# Patient Record
Sex: Female | Born: 1970 | Race: Black or African American | Hispanic: No | Smoking: Former smoker
Health system: Southern US, Community
[De-identification: ages and names within clinical notes are randomized; demographics above are authoritative.]

## PROBLEM LIST (undated history)

## (undated) DIAGNOSIS — Z9221 Personal history of antineoplastic chemotherapy: Secondary | ICD-10-CM

## (undated) DIAGNOSIS — Z923 Personal history of irradiation: Secondary | ICD-10-CM

## (undated) DIAGNOSIS — Z8042 Family history of malignant neoplasm of prostate: Secondary | ICD-10-CM

## (undated) DIAGNOSIS — Z803 Family history of malignant neoplasm of breast: Secondary | ICD-10-CM

## (undated) DIAGNOSIS — C50919 Malignant neoplasm of unspecified site of unspecified female breast: Secondary | ICD-10-CM

## (undated) DIAGNOSIS — Z800 Family history of malignant neoplasm of digestive organs: Secondary | ICD-10-CM

## (undated) DIAGNOSIS — K219 Gastro-esophageal reflux disease without esophagitis: Secondary | ICD-10-CM

## (undated) DIAGNOSIS — Z1501 Genetic susceptibility to malignant neoplasm of breast: Secondary | ICD-10-CM

## (undated) DIAGNOSIS — J45909 Unspecified asthma, uncomplicated: Secondary | ICD-10-CM

## (undated) HISTORY — DX: Family history of malignant neoplasm of digestive organs: Z80.0

## (undated) HISTORY — PX: COLONOSCOPY: SHX174

## (undated) HISTORY — PX: PORT-A-CATH REMOVAL: SHX5289

## (undated) HISTORY — DX: Family history of malignant neoplasm of prostate: Z80.42

## (undated) HISTORY — DX: Genetic susceptibility to malignant neoplasm of breast: Z15.01

## (undated) HISTORY — PX: MASTECTOMY: SHX3

## (undated) HISTORY — DX: Personal history of irradiation: Z92.3

## (undated) HISTORY — DX: Family history of malignant neoplasm of breast: Z80.3

## (undated) NOTE — *Deleted (*Deleted)
Radiation Oncology         [phone] ________________________________  Name: TATUM CORL MRN: [id number]  Date: 05/19/2020  DOB: 1971-05-25  Follow-Up Visit Note  CC: Patient, [name]  Serena Croissant, MD  No diagnosis found.  Diagnosis: Stage IIIC (ypT0, ypN0) Right Breast Invasive Ductal Carcinoma, ER+ / PR- / Her2-, Grade 3  Interval Since Last Radiation: Two weeks  Radiation Treatment Dates: 03/25/2020 through 05/05/2020 Site Technique Total Dose (Gy) Dose per Fx (Gy) Completed Fx Beam Energies  Chest Wall, Right: CW_Rt 3D 50/50 2 25/25 6X  Chest Wall, Right: CW_Rt_Bst Electron 10/10 2 5/5 6E  Chest Wall, Right: CW_Rt_SCV 3D 50/50 2 25/25 6X, 10X    Narrative:  The patient returns today for skin recheck. She was seen four days ago for an unscheduled follow-up regarding skin breakdown in the treatment area. At that time, she was given Silvadene and was advised to continue using Sonofine elsewhere along the chest wall area. She was also given samples of triple antibiotic ointment to use.   On review of systems, she reports ***. She denies ***.   ALLERGIES:  is allergic to penicillins.  Meds: Current Outpatient Medications  Medication Sig Dispense Refill  . acetaminophen (TYLENOL) 325 MG tablet Take 650 mg by mouth every 6 (six) hours as needed for moderate pain or headache.     . cetirizine (ZYRTEC) 10 MG tablet Take 10 mg by mouth daily as needed for allergies.    Marland Kitchen gabapentin (NEURONTIN) 100 MG capsule Take 100-300 mg by mouth See admin instructions. Take 100 mg at bedtime, may increase up to 300 mg at bedtime as needed for pain    . gabapentin (NEURONTIN) 100 MG capsule Take 1 capsule (100 mg total) by mouth at bedtime. 30 capsule 1  . ibuprofen (ADVIL) 800 MG tablet Take 800 mg by mouth every 8 (eight) hours as needed for moderate pain.    Marland Kitchen lidocaine-prilocaine (EMLA) cream Apply to affected area once (Patient taking differently: Apply 1 application topically  daily as needed (port access). ) 30 g 3  . LORazepam (ATIVAN) 0.5 MG tablet Take 1 tablet (0.5 mg total) by mouth at bedtime as needed (Nausea or vomiting). 30 tablet 1  . methocarbamol (ROBAXIN) 500 MG tablet Take 1 tablet (500 mg total) by mouth 3 (three) times daily. 30 tablet 1  . ondansetron (ZOFRAN) 8 MG tablet Take 1 tablet (8 mg total) by mouth 2 (two) times daily as needed. Start on the third day after chemotherapy. 30 tablet 1  . oxyCODONE (OXY IR/ROXICODONE) 5 MG immediate release tablet Take 1 tablet (5 mg total) by mouth every 4 (four) hours as needed for moderate pain. 12 tablet 0  . prochlorperazine (COMPAZINE) 10 MG tablet Take 1 tablet (10 mg total) by mouth every 6 (six) hours as needed (Nausea or vomiting). 30 tablet 1  . tamoxifen (NOLVADEX) 20 MG tablet Take 1 tablet (20 mg total) by mouth daily. 90 tablet 3  . traMADol (ULTRAM) 50 MG tablet Take 1 tablet (50 mg total) by mouth every 6 (six) hours as needed. 12 tablet 0   No current facility-administered medications for this encounter.    Physical Findings: The patient is in no acute distress. Patient is alert and oriented.  vitals were not taken for this visit. The lungs are clear. The heart has a regular rhythm and rate.  The right chest wall area shows ***.  Lab Findings: Lab Results  Component Value Date  WBC 3.4 (L) 01/17/2020   HGB 13.1 01/17/2020   HCT 39.3 01/17/2020   MCV 93.6 01/17/2020   PLT 254 01/17/2020    Radiographic Findings: No results found.  Impression: Stage IIIC (ypT0, ypN0) Right Breast Invasive Ductal Carcinoma, ER+ / PR- / Her2-, Grade 3  The patient is recovering from the effects of radiation. ***  Plan: The patient will return in *** for close follow-up concerning her skin breakdown.  ____________________________________   Billie Lade, PhD, MD  This document serves as a record of services personally performed by Antony Blackbird, MD. It was created on his behalf by Nikki Dom,  a trained medical scribe. The creation of this record is based on the scribe's personal observations and the provider's statements to them. This document has been checked and approved by the attending provider.

---

## 1997-10-09 ENCOUNTER — Inpatient Hospital Stay (HOSPITAL_COMMUNITY): Admission: AD | Admit: 1997-10-09 | Discharge: 1997-10-09 | Payer: Self-pay | Admitting: *Deleted

## 1997-10-09 ENCOUNTER — Other Ambulatory Visit: Admission: RE | Admit: 1997-10-09 | Discharge: 1997-10-09 | Payer: Self-pay | Admitting: *Deleted

## 1999-01-10 ENCOUNTER — Encounter: Payer: Self-pay | Admitting: Emergency Medicine

## 1999-01-10 ENCOUNTER — Emergency Department (HOSPITAL_COMMUNITY): Admission: EM | Admit: 1999-01-10 | Discharge: 1999-01-10 | Payer: Self-pay | Admitting: Emergency Medicine

## 1999-08-20 ENCOUNTER — Emergency Department (HOSPITAL_COMMUNITY): Admission: EM | Admit: 1999-08-20 | Discharge: 1999-08-20 | Payer: Self-pay | Admitting: Emergency Medicine

## 1999-12-29 ENCOUNTER — Emergency Department (HOSPITAL_COMMUNITY): Admission: EM | Admit: 1999-12-29 | Discharge: 1999-12-29 | Payer: Self-pay | Admitting: Emergency Medicine

## 2003-03-05 ENCOUNTER — Emergency Department (HOSPITAL_COMMUNITY): Admission: EM | Admit: 2003-03-05 | Discharge: 2003-03-05 | Payer: Self-pay | Admitting: Emergency Medicine

## 2003-03-05 ENCOUNTER — Encounter: Payer: Self-pay | Admitting: Emergency Medicine

## 2004-02-12 ENCOUNTER — Encounter (INDEPENDENT_AMBULATORY_CARE_PROVIDER_SITE_OTHER): Payer: Self-pay | Admitting: Specialist

## 2004-02-12 ENCOUNTER — Ambulatory Visit (HOSPITAL_COMMUNITY): Admission: RE | Admit: 2004-02-12 | Discharge: 2004-02-12 | Payer: Self-pay | Admitting: Obstetrics

## 2005-04-24 ENCOUNTER — Emergency Department (HOSPITAL_COMMUNITY): Admission: EM | Admit: 2005-04-24 | Discharge: 2005-04-24 | Payer: Self-pay | Admitting: Emergency Medicine

## 2005-12-09 ENCOUNTER — Emergency Department (HOSPITAL_COMMUNITY): Admission: EM | Admit: 2005-12-09 | Discharge: 2005-12-09 | Payer: Self-pay | Admitting: Family Medicine

## 2006-09-09 ENCOUNTER — Emergency Department (HOSPITAL_COMMUNITY): Admission: EM | Admit: 2006-09-09 | Discharge: 2006-09-09 | Payer: Self-pay | Admitting: Family Medicine

## 2007-04-19 ENCOUNTER — Emergency Department (HOSPITAL_COMMUNITY): Admission: EM | Admit: 2007-04-19 | Discharge: 2007-04-19 | Payer: Self-pay | Admitting: Family Medicine

## 2008-03-06 ENCOUNTER — Emergency Department (HOSPITAL_COMMUNITY): Admission: EM | Admit: 2008-03-06 | Discharge: 2008-03-06 | Payer: Self-pay | Admitting: Family Medicine

## 2008-05-15 ENCOUNTER — Inpatient Hospital Stay (HOSPITAL_COMMUNITY): Admission: AD | Admit: 2008-05-15 | Discharge: 2008-05-15 | Payer: Self-pay | Admitting: Obstetrics

## 2008-07-19 ENCOUNTER — Emergency Department (HOSPITAL_COMMUNITY): Admission: EM | Admit: 2008-07-19 | Discharge: 2008-07-20 | Payer: Self-pay | Admitting: Family Medicine

## 2008-11-01 ENCOUNTER — Emergency Department (HOSPITAL_COMMUNITY): Admission: EM | Admit: 2008-11-01 | Discharge: 2008-11-01 | Payer: Self-pay | Admitting: Family Medicine

## 2008-12-11 ENCOUNTER — Encounter (INDEPENDENT_AMBULATORY_CARE_PROVIDER_SITE_OTHER): Payer: Self-pay | Admitting: Obstetrics

## 2008-12-11 ENCOUNTER — Ambulatory Visit (HOSPITAL_COMMUNITY): Admission: RE | Admit: 2008-12-11 | Discharge: 2008-12-11 | Payer: Self-pay | Admitting: Obstetrics

## 2009-01-30 ENCOUNTER — Emergency Department (HOSPITAL_COMMUNITY): Admission: EM | Admit: 2009-01-30 | Discharge: 2009-01-30 | Payer: Self-pay | Admitting: Emergency Medicine

## 2010-04-26 ENCOUNTER — Emergency Department (HOSPITAL_COMMUNITY)
Admission: EM | Admit: 2010-04-26 | Discharge: 2010-04-26 | Payer: Self-pay | Source: Home / Self Care | Admitting: Emergency Medicine

## 2010-07-19 ENCOUNTER — Encounter: Payer: Self-pay | Admitting: Obstetrics

## 2010-08-10 ENCOUNTER — Inpatient Hospital Stay (INDEPENDENT_AMBULATORY_CARE_PROVIDER_SITE_OTHER)
Admission: RE | Admit: 2010-08-10 | Discharge: 2010-08-10 | Disposition: A | Discharge status: Home / Self Care | Payer: Self-pay | Source: Ambulatory Visit | Attending: Emergency Medicine | Admitting: Emergency Medicine

## 2010-08-10 ENCOUNTER — Ambulatory Visit (INDEPENDENT_AMBULATORY_CARE_PROVIDER_SITE_OTHER): Payer: Self-pay

## 2010-08-10 DIAGNOSIS — S60229A Contusion of unspecified hand, initial encounter: Secondary | ICD-10-CM

## 2010-08-20 ENCOUNTER — Emergency Department (HOSPITAL_COMMUNITY)
Admission: EM | Admit: 2010-08-20 | Discharge: 2010-08-20 | Disposition: A | Discharge status: Home / Self Care | Payer: Medicaid Other | Attending: Emergency Medicine | Admitting: Emergency Medicine

## 2010-08-20 ENCOUNTER — Emergency Department (HOSPITAL_COMMUNITY): Payer: Medicaid Other

## 2010-08-20 DIAGNOSIS — S6990XA Unspecified injury of unspecified wrist, hand and finger(s), initial encounter: Secondary | ICD-10-CM | POA: Insufficient documentation

## 2010-08-20 DIAGNOSIS — M79609 Pain in unspecified limb: Secondary | ICD-10-CM | POA: Insufficient documentation

## 2010-08-20 DIAGNOSIS — J45909 Unspecified asthma, uncomplicated: Secondary | ICD-10-CM | POA: Insufficient documentation

## 2010-08-20 DIAGNOSIS — S6390XA Sprain of unspecified part of unspecified wrist and hand, initial encounter: Secondary | ICD-10-CM | POA: Insufficient documentation

## 2010-08-20 DIAGNOSIS — IMO0002 Reserved for concepts with insufficient information to code with codable children: Secondary | ICD-10-CM | POA: Insufficient documentation

## 2010-10-03 LAB — POCT URINALYSIS DIP (DEVICE)
Glucose, UA: NEGATIVE mg/dL
Hgb urine dipstick: NEGATIVE
Nitrite: NEGATIVE
Protein, ur: NEGATIVE mg/dL
Specific Gravity, Urine: 1.02 (ref 1.005–1.030)
Urobilinogen, UA: 0.2 mg/dL (ref 0.0–1.0)
pH: 6.5 (ref 5.0–8.0)

## 2010-10-05 LAB — CBC
HCT: 38.7 % (ref 36.0–46.0)
Hemoglobin: 13.6 g/dL (ref 12.0–15.0)
MCHC: 35.3 g/dL (ref 30.0–36.0)
MCV: 94.3 fL (ref 78.0–100.0)
Platelets: 219 10*3/uL (ref 150–400)
RDW: 13.2 % (ref 11.5–15.5)

## 2010-10-06 LAB — CULTURE, ROUTINE-ABSCESS

## 2010-10-21 ENCOUNTER — Other Ambulatory Visit (HOSPITAL_COMMUNITY): Payer: Self-pay | Admitting: Obstetrics

## 2010-10-21 DIAGNOSIS — Z1231 Encounter for screening mammogram for malignant neoplasm of breast: Secondary | ICD-10-CM

## 2010-10-28 ENCOUNTER — Ambulatory Visit (HOSPITAL_COMMUNITY)
Admission: RE | Admit: 2010-10-28 | Discharge: 2010-10-28 | Disposition: A | Discharge status: Home / Self Care | Payer: Medicaid Other | Source: Ambulatory Visit | Attending: Obstetrics | Admitting: Obstetrics

## 2010-10-28 DIAGNOSIS — Z1231 Encounter for screening mammogram for malignant neoplasm of breast: Secondary | ICD-10-CM

## 2010-11-06 ENCOUNTER — Emergency Department (HOSPITAL_COMMUNITY): Payer: Medicaid Other

## 2010-11-06 ENCOUNTER — Emergency Department (HOSPITAL_COMMUNITY)
Admission: EM | Admit: 2010-11-06 | Discharge: 2010-11-06 | Disposition: A | Discharge status: Home / Self Care | Payer: Medicaid Other | Attending: Emergency Medicine | Admitting: Emergency Medicine

## 2010-11-06 DIAGNOSIS — S9030XA Contusion of unspecified foot, initial encounter: Secondary | ICD-10-CM | POA: Insufficient documentation

## 2010-11-06 DIAGNOSIS — M7989 Other specified soft tissue disorders: Secondary | ICD-10-CM | POA: Insufficient documentation

## 2010-11-06 DIAGNOSIS — R262 Difficulty in walking, not elsewhere classified: Secondary | ICD-10-CM | POA: Insufficient documentation

## 2010-11-06 DIAGNOSIS — S8990XA Unspecified injury of unspecified lower leg, initial encounter: Secondary | ICD-10-CM | POA: Insufficient documentation

## 2010-11-06 DIAGNOSIS — M79609 Pain in unspecified limb: Secondary | ICD-10-CM | POA: Insufficient documentation

## 2010-11-06 DIAGNOSIS — J45909 Unspecified asthma, uncomplicated: Secondary | ICD-10-CM | POA: Insufficient documentation

## 2010-11-06 DIAGNOSIS — W208XXA Other cause of strike by thrown, projected or falling object, initial encounter: Secondary | ICD-10-CM | POA: Insufficient documentation

## 2010-11-10 NOTE — Op Note (Signed)
Gina Ross, Gina Ross NO.:  [id number]   MEDICAL RECORD NO.:  [id number]          PATIENT TYPE:  AMB   LOCATION:  SDC                           FACILITY:  [facility]   PHYSICIAN:  Kathreen Cosier, M.D.DATE OF BIRTH:  [birth year]   DATE OF PROCEDURE:  12/11/2008  DATE OF DISCHARGE:  12/11/2008                               OPERATIVE REPORT   PREOPERATIVE DIAGNOSES:  High grade lesion at the cervix on an  endocervical curettage and previous conization.   POSTOPERATIVE DIAGNOSES:  High grade lesion at the cervix on an  endocervical curettage and previous conization.   PROCEDURE:  Cold knife cone.   Under general anesthesia, the patient in lithotomy position, perineum  and vagina prepped and draped.  Bladder emptied with straight catheter.  Cervix injected with 9 mL of 1% Xylocaine.  Hemostatic suture of #1  chromic placed at 3 and 9 o'clock on the lateral aspects of the cervix.  The cold knife cone done in the usual manner and hemostasis was achieved  with U sutures placed around the cervix.  The patient tolerated the  procedure well, taken to recovery room in good condition.           ______________________________  Kathreen Cosier, M.D.     BAM/MEDQ  D:  12/11/2008  T:  12/12/2008  Job:  [id number]

## 2010-11-13 NOTE — Op Note (Signed)
Gina Ross, Gina Ross NO.:  [id number]   MEDICAL RECORD NO.:  [id number]                   PATIENT TYPE:  AMB   LOCATION:  SDC                                  FACILITY:  [facility]   PHYSICIAN:  Kathreen Cosier, M.D.           DATE OF BIRTH:  1970/08/25   DATE OF PROCEDURE:  02/12/2004  DATE OF DISCHARGE:                                 OPERATIVE REPORT   PREOPERATIVE DIAGNOSIS:  Severe dysplasia of the cervix.   POSTOPERATIVE DIAGNOSIS:  Severe dysplasia of the cervix.   OPERATION PERFORMED:  Cold knife conization.   SURGEON:  Kathreen Cosier, M.D.   ANESTHESIA:  General.   DESCRIPTION OF PROCEDURE:  Under general anesthesia, patient in lithotomy  position, perineum and vagina prepped and draped, bladder emptied with  straight catheter.  Bimanual exam, the uterus was normal in size. Weighted  speculum placed in the vagina.  Hemostatic suture of #1 chromic was placed  high up on the lateral aspect of the cervix at 3 o'clock and 9 o'clock.  Then a cold knife cone done in the usual manner.  Hemostasis was achieved  with U-sutures around the cervix with #1 chromic.  Hemostasis was  satisfactory.  The cervix was sounded and found to be patent.  The patient  tolerated the procedure well, taken to recovery room in good condition.                                               Kathreen Cosier, M.D.    BAM/MEDQ  D:  02/12/2004  T:  02/12/2004  Job:  [id number]

## 2011-03-30 LAB — URINALYSIS, ROUTINE W REFLEX MICROSCOPIC
Glucose, UA: NEGATIVE
Hgb urine dipstick: NEGATIVE
Protein, ur: NEGATIVE
Specific Gravity, Urine: 1.02

## 2011-03-30 LAB — POCT PREGNANCY, URINE: Preg Test, Ur: NEGATIVE

## 2011-04-07 LAB — WET PREP, GENITAL
Trich, Wet Prep: NONE SEEN
Yeast Wet Prep HPF POC: NONE SEEN

## 2011-04-07 LAB — POCT URINALYSIS DIP (DEVICE)
Bilirubin Urine: NEGATIVE
Ketones, ur: NEGATIVE
pH: 7

## 2011-04-07 LAB — POCT PREGNANCY, URINE: Operator id: 239701

## 2011-04-07 LAB — GC/CHLAMYDIA PROBE AMP, GENITAL: Chlamydia, DNA Probe: NEGATIVE

## 2011-10-22 ENCOUNTER — Encounter (HOSPITAL_COMMUNITY): Payer: Self-pay

## 2011-10-22 ENCOUNTER — Emergency Department (INDEPENDENT_AMBULATORY_CARE_PROVIDER_SITE_OTHER): Payer: Medicaid Other

## 2011-10-22 ENCOUNTER — Emergency Department (INDEPENDENT_AMBULATORY_CARE_PROVIDER_SITE_OTHER)
Admission: EM | Admit: 2011-10-22 | Discharge: 2011-10-22 | Disposition: A | Discharge status: Home / Self Care | Payer: Self-pay | Source: Home / Self Care | Attending: Emergency Medicine | Admitting: Emergency Medicine

## 2011-10-22 DIAGNOSIS — S40029A Contusion of unspecified upper arm, initial encounter: Secondary | ICD-10-CM

## 2011-10-22 DIAGNOSIS — S40021A Contusion of right upper arm, initial encounter: Secondary | ICD-10-CM

## 2011-10-22 MED ORDER — TRAMADOL HCL 50 MG PO TABS: 100.0000 mg | ORAL_TABLET | Freq: Three times a day (TID) | ORAL | Status: AC | PRN

## 2011-10-22 MED ORDER — MELOXICAM 15 MG PO TABS: 15.0000 mg | ORAL_TABLET | Freq: Every day | ORAL | Status: AC

## 2011-10-22 NOTE — Discharge Instructions (Signed)
Bone Bruise  A bone bruise is a small hidden fracture of the bone. It typically occurs with bones located close to the surface of the skin.  SYMPTOMS  The pain lasts longer than a normal bruise.   The bruised area is difficult to use.   There may be discoloration or swelling of the bruised area.   When a bone bruise is found with injury to the anterior cruciate ligament (in the knee) there is often an increased:   Amount of fluid in the knee   Time the fluid in the knee lasts.   Number of days until you are walking normally and regaining the motion you had before the injury.   Number of days with pain from the injury.  DIAGNOSIS  It can only be seen on X-rays known as MRIs. This stands for magnetic resonance imaging. A regular X-ray taken of a bone bruise would appear to be normal. A bone bruise is a common injury in the knee and the heel bone (calcaneus). The problems are similar to those produced by stress fractures, which are bone injuries caused by overuse. A bone bruise may also be a sign of other injuries. For example, bone bruises are commonly found where an anterior cruciate ligament (ACL) in the knee has been pulled away from the bone (ruptured). A ligament is a tough fibrous material that connects bones together to make our joints stable. Bruises of the bone last a lot longer than bruises of the muscle or tissues beneath the skin. Bone bruises can last from days to months and are often more severe and painful than other bruises. TREATMENT Because bone bruises are sudden injuries you cannot often prevent them, other than by being extremely careful. Some things you can do to improve the condition are:  Apply ice to the sore area for 15 to 20 minutes, 3 to 4 times per day while awake for the first 2 days. Put the ice in a plastic bag, and place a towel between the bag of ice and your skin.   Keep your bruised area raised (elevated) when possible to lessen swelling.   For activity:     Use crutches when necessary; do not put weight on the injured leg until you are no longer tender.   You may walk on your affected part as the pain allows, or as instructed.   Start weight bearing gradually on the bruised part.   Continue to use crutches or a cane until you can stand without causing pain, or as instructed.   If a plaster splint was applied, wear the splint until you are seen for a follow-up examination. Rest it on nothing harder than a pillow the first 24 hours. Do not put weight on it. Do not get it wet. You may take it off to take a shower or bath.   If an air splint was applied, more air may be blown into or out of the splint as needed for comfort. You may take it off at night and to take a shower or bath.   Wiggle your toes in the splint several times per day if you are able.   You may have been given an elastic bandage to use with the plaster splint or alone. The splint is too tight if you have numbness, tingling or if your foot becomes cold and blue. Adjust the bandage to make it comfortable.   Only take over-the-counter or prescription medicines for pain, discomfort, or fever as directed by   your caregiver.   Follow all instructions for follow up with your caregiver. This includes any orthopedic referrals, physical therapy, and rehabilitation. Any delay in obtaining necessary care could result in a delay or failure of the bones to heal.  SEEK MEDICAL CARE IF:   You have an increase in bruising, swelling, or pain.   You notice coldness of your toes.   You do not get pain relief with medications.  SEEK IMMEDIATE MEDICAL CARE IF:   Your toes are numb or blue.   You have severe pain not controlled with medications.   If any of the problems that caused you to seek care are becoming worse.  Document Released: 09/04/2003 Document Revised: 06/03/2011 Document Reviewed: 01/17/2008 [facility] Patient Information 2012 [place], [place].Contusion A contusion is a deep  bruise. Contusions are the result of an injury that caused bleeding under the skin. The contusion may turn blue, purple, or yellow. Minor injuries will give you a painless contusion, but more severe contusions may stay painful and swollen for a few weeks.  CAUSES  A contusion is usually caused by a blow, trauma, or direct force to an area of the body. SYMPTOMS   Swelling and redness of the injured area.   Bruising of the injured area.   Tenderness and soreness of the injured area.   Pain.  DIAGNOSIS  The diagnosis can be made by taking a history and physical exam. An X-ray, CT scan, or MRI may be needed to determine if there were any associated injuries, such as fractures. TREATMENT  Specific treatment will depend on what area of the body was injured. In general, the best treatment for a contusion is resting, icing, elevating, and applying cold compresses to the injured area. Over-the-counter medicines may also be recommended for pain control. Ask your caregiver what the best treatment is for your contusion. HOME CARE INSTRUCTIONS   Put ice on the injured area.   Put ice in a plastic bag.   Place a towel between your skin and the bag.   Leave the ice on for 15 to 20 minutes, 3 to 4 times a day.   Only take over-the-counter or prescription medicines for pain, discomfort, or fever as directed by your caregiver. Your caregiver may recommend avoiding anti-inflammatory medicines (aspirin, ibuprofen, and naproxen) for 48 hours because these medicines may increase bruising.   Rest the injured area.   If possible, elevate the injured area to reduce swelling.  SEEK IMMEDIATE MEDICAL CARE IF:   You have increased bruising or swelling.   You have pain that is getting worse.   Your swelling or pain is not relieved with medicines.  MAKE SURE YOU:   Understand these instructions.   Will watch your condition.   Will get help right away if you are not doing well or get worse.  Document  Released: 03/24/2005 Document Revised: 06/03/2011 Document Reviewed: 04/19/2011 [facility] Patient Information 2012 [place], [place].   Do exercises as demonstrated twice daily, followed by ice for 10 to 15 minutes.  If no better in 2 weeks, follow up with orthopedist.

## 2011-10-22 NOTE — ED Notes (Signed)
Went roller skating last week end, fell several times; pain right upper arm, mid aspect; using motrin for pain; pain limitation w ROM shoulder, elbow, good sensation distaly
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
## 2012-09-01 ENCOUNTER — Encounter (HOSPITAL_COMMUNITY): Payer: Self-pay | Admitting: *Deleted

## 2012-09-01 ENCOUNTER — Emergency Department (INDEPENDENT_AMBULATORY_CARE_PROVIDER_SITE_OTHER)
Admission: EM | Admit: 2012-09-01 | Discharge: 2012-09-01 | Disposition: A | Discharge status: Home / Self Care | Payer: Self-pay | Source: Home / Self Care

## 2012-09-01 DIAGNOSIS — T148XXA Other injury of unspecified body region, initial encounter: Secondary | ICD-10-CM
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
## 2012-09-08 NOTE — ED Provider Notes (Signed)
Medical screening examination/treatment/procedure(s) were performed by non-physician practitioner and as supervising physician I was immediately available for consultation/collaboration.  Leslee Home, M.D.  Reuben Likes, MD 09/08/12 [phone]

## 2013-01-01 ENCOUNTER — Encounter (HOSPITAL_COMMUNITY): Payer: Self-pay | Admitting: Emergency Medicine

## 2013-01-01 ENCOUNTER — Emergency Department (INDEPENDENT_AMBULATORY_CARE_PROVIDER_SITE_OTHER)
Admission: EM | Admit: 2013-01-01 | Discharge: 2013-01-01 | Disposition: A | Discharge status: Home / Self Care | Payer: Self-pay | Source: Home / Self Care

## 2013-01-01 DIAGNOSIS — J020 Streptococcal pharyngitis: Secondary | ICD-10-CM

## 2013-01-01 LAB — POCT RAPID STREP A: Streptococcus, Group A Screen (Direct): POSITIVE — AB

## 2013-01-01 MED ORDER — PENICILLIN G BENZATHINE 1200000 UNIT/2ML IM SUSP
INTRAMUSCULAR | Status: AC
  Filled 2013-01-01: qty 2

## 2013-01-01 MED ORDER — METHYLPREDNISOLONE ACETATE 40 MG/ML IJ SUSP
20.0000 mg | Freq: Once | INTRAMUSCULAR | Status: AC
  Administered 2013-01-01: 20 mg via INTRAMUSCULAR

## 2013-01-01 MED ORDER — METHYLPREDNISOLONE ACETATE 40 MG/ML IJ SUSP
INTRAMUSCULAR | Status: AC
  Filled 2013-01-01: qty 5

## 2013-01-01 MED ORDER — TRAMADOL HCL 50 MG PO TABS: 50.0000 mg | ORAL_TABLET | Freq: Four times a day (QID) | ORAL | Status: DC | PRN

## 2013-01-01 MED ORDER — AZITHROMYCIN 250 MG PO TABS: ORAL_TABLET | ORAL | Status: DC

## 2013-01-01 MED ORDER — PENICILLIN G BENZATHINE 1200000 UNIT/2ML IM SUSP
1.2000 10*6.[IU] | Freq: Once | INTRAMUSCULAR | Status: AC
  Administered 2013-01-01: 1.2 10*6.[IU] via INTRAMUSCULAR

## 2013-01-01 NOTE — ED Provider Notes (Signed)
History    CSN: [id number] Arrival date & time 01/01/13  1015  First MD Initiated Contact with Patient 01/01/13 1057     Chief Complaint  Patient presents with  . Sore Throat   (Consider location/radiation/quality/duration/timing/severity/associated sxs/prior Treatment) HPI    42 yo bf presents with the above complaint.  States that sudden onset of sore throat started yesterday and was feeling fine the day before.   Pain with swallowing and talking.  Feels hot with chills.  Has not checked temp.  Denies headache, nause, vomiting, cough, cp, sob, dysphagia.   History reviewed. No pertinent past medical history. History reviewed. No pertinent past surgical history. Family History  Problem Relation Age of Onset  . Hypertension Mother   . Hypertension Father    History  Substance Use Topics  . Smoking status: Current Every Day Smoker -- 0.25 packs/day    Types: Cigarettes  . Smokeless tobacco: Not on file  . Alcohol Use: Yes     Comment: socailly   OB History   Grav Para Term Preterm Abortions TAB SAB Ect Mult Living                 Review of Systems  Constitutional: Positive for chills and fatigue.  HENT: Positive for neck pain. Negative for congestion, rhinorrhea, sneezing and postnasal drip.   Eyes: Negative.   Respiratory: Negative for cough, choking, chest tightness and shortness of breath.   Cardiovascular: Negative.   Gastrointestinal: Negative.   Endocrine: Negative.   Genitourinary: Negative.   Skin: Negative.   Allergic/Immunologic: Negative.   Neurological: Negative.   Psychiatric/Behavioral: Negative.     Allergies  Review of patient's allergies indicates no known allergies.  Home Medications   Current Outpatient Rx  Name  Route  Sig  Dispense  Refill  . azithromycin (ZITHROMAX) 250 MG tablet      5 day dose pack.  Take as directed.   6 tablet   0   . traMADol (ULTRAM) 50 MG tablet   Oral   Take 1 tablet (50 mg total) by mouth every 6 (six)  hours as needed for pain.   20 tablet   0    BP 130/78  Pulse 84  Temp(Src) 98.6 F (37 C) (Oral)  Resp 16  SpO2 100%  LMP 12/12/2012 Physical Exam  Constitutional: She is oriented to person, place, and time. She appears well-developed and well-nourished.  HENT:  Head: Normocephalic and atraumatic.  Nose: Nose normal.  Mouth/Throat: Oropharyngeal exudate (few white patches.  red, edematous) present.  Eyes: EOM are normal. Pupils are equal, round, and reactive to light.  Neck: Normal range of motion.  Cardiovascular: Normal rate and regular rhythm.   Pulmonary/Chest: Effort normal and breath sounds normal.  Musculoskeletal: Normal range of motion.  Lymphadenopathy:    She has cervical adenopathy.  Neurological: She is alert and oriented to person, place, and time.  Skin: Skin is warm and dry.  Psychiatric: She has a normal mood and affect.    ED Course  Procedures (including critical care time) Labs Reviewed  POCT RAPID STREP A ([facility] ONLY) - Abnormal; Notable for the following:    Streptococcus, Group A Screen (Direct) POSITIVE (*)    All other components within normal limits   No results found. 1. Strep throat     MDM  Give IM Bicillin LA  And depomedrol injections today.  Script for zpack.  Will follow up with primary care in 3-5 days if  not better and will return here or go to the ED sooner if needed.   All questions answered and instructions were discussed.    Meds ordered this encounter  Medications  . penicillin g benzathine (BICILLIN LA) 1200000 UNIT/2ML injection 1.2 Million Units    Sig:   . methylPREDNISolone acetate (DEPO-MEDROL) injection 20 mg    Sig:   . azithromycin (ZITHROMAX) 250 MG tablet    Sig: 5 day dose pack.  Take as directed.    Dispense:  6 tablet    Refill:  0  . traMADol (ULTRAM) 50 MG tablet    Sig: Take 1 tablet (50 mg total) by mouth every 6 (six) hours as needed for pain.    Dispense:  20 tablet    Refill:  0    Zonia Kief, PA-C 01/01/13 1125

## 2013-01-01 NOTE — ED Notes (Signed)
Pt c/o sore throat onset yesterday. Feels like lymphnodes are swollen on the right side and is in severe pain especially when swallowing. Has tried gargling with salt water. Denies fever. Yesterday felt really hot and then had chills. Patient is alert and oriented.

## 2013-01-01 NOTE — ED Provider Notes (Signed)
Medical screening examination/treatment/procedure(s) were performed by non-physician practitioner and as supervising physician I was immediately available for consultation/collaboration.  Leslee Home, M.D.  Reuben Likes, MD 01/01/13 [phone]

## 2013-07-30 NOTE — ED Notes (Signed)
Accessed record for jennifer/billing

## 2013-08-13 ENCOUNTER — Encounter (HOSPITAL_COMMUNITY): Payer: Self-pay | Admitting: Emergency Medicine

## 2013-08-13 ENCOUNTER — Emergency Department (INDEPENDENT_AMBULATORY_CARE_PROVIDER_SITE_OTHER): Payer: Self-pay

## 2013-08-13 ENCOUNTER — Emergency Department (INDEPENDENT_AMBULATORY_CARE_PROVIDER_SITE_OTHER)
Admission: EM | Admit: 2013-08-13 | Discharge: 2013-08-13 | Disposition: A | Discharge status: Home / Self Care | Payer: Self-pay | Source: Home / Self Care | Attending: Family Medicine | Admitting: Family Medicine

## 2013-08-13 DIAGNOSIS — S92919A Unspecified fracture of unspecified toe(s), initial encounter for closed fracture: Secondary | ICD-10-CM

## 2013-08-13 DIAGNOSIS — S92502A Displaced unspecified fracture of left lesser toe(s), initial encounter for closed fracture: Secondary | ICD-10-CM

## 2013-08-13 MED ORDER — IBUPROFEN 800 MG PO TABS
ORAL_TABLET | ORAL | Status: AC
  Filled 2013-08-13: qty 1

## 2013-08-13 MED ORDER — IBUPROFEN 800 MG PO TABS
800.0000 mg | ORAL_TABLET | Freq: Once | ORAL | Status: AC
  Administered 2013-08-13: 800 mg via ORAL

## 2013-08-13 MED ORDER — DICLOFENAC SODIUM 50 MG PO TBEC: 50.0000 mg | DELAYED_RELEASE_TABLET | Freq: Two times a day (BID) | ORAL | Status: DC | PRN

## 2013-08-13 NOTE — ED Provider Notes (Signed)
Gina Ross is a 43 y.o. female who presents to Urgent Care today for left foot pain. Patient accidentally hit the lateral side of her left foot against a tree stump last night. She notes significant pain and difficulty with ambulation. She has not tried any medications yet. She denies any weakness or numbness. No radiating pain. No nausea vomiting or diarrhea. She is well otherwise.   History reviewed. No pertinent past medical history. History  Substance Use Topics  . Smoking status: Current Every Day Smoker -- 0.25 packs/day    Types: Cigarettes  . Smokeless tobacco: Not on file  . Alcohol Use: Yes     Comment: socailly   ROS as above Medications: No current facility-administered medications for this encounter.   Current Outpatient Prescriptions  Medication Sig Dispense Refill  . diclofenac (VOLTAREN) 50 MG EC tablet Take 1 tablet (50 mg total) by mouth 2 (two) times daily as needed.  60 tablet  0    Exam:  BP 125/91  Pulse 64  Temp(Src) 98.2 F (36.8 C) (Oral)  Resp 16  SpO2 98%  LMP 08/06/2013 Gen: Well NAD LEFT FOOT: Normal-appearing no deformity noted. Tender palpation along the distal fifth metatarsal and proximal phalanx Capillary refill sensation and motion are intact distally   No results found for this or any previous visit (from the past 24 hour(s)). Dg Foot Complete Left  08/13/2013   CLINICAL DATA:  Pain post trauma  EXAM: LEFT FOOT - COMPLETE 3+ VIEW  COMPARISON:  Nov 06, 2010  FINDINGS: Oblique, and lateral views were obtained. There is a fracture through the mid portion of the fifth proximal phalanx. Alignment is near anatomic. No other fractures. No dislocation. Joint spaces appear intact. There is early bunion formation at first MTP joint.  IMPRESSION: Fracture fifth proximal phalanx in near anatomic alignment. Early bunion formation at first MTP joint region.   Electronically Signed   By: Lowella Grip M.D.   On: 08/13/2013 08:36    Assessment and  Plan: 43 y.o. female with fifth proximal phalanx fracture. Plan: Diclofenac for pain, body tape, postoperative shoe. Followup  Discussed warning signs or symptoms. Please see discharge instructions. Patient expresses understanding.    Gregor Hams, MD 08/13/13 [phone]

## 2013-08-13 NOTE — ED Notes (Signed)
States she injured left 5th toe last PM; pain "10" has not taken anything at home for pain

## 2013-08-13 NOTE — Discharge Instructions (Signed)
Thank you for coming in today. Keep the two toes taped together for 4 weeks.  Use the rigid sole shoe as needed for pain.  Come back as needed.   Buddy Taping of Toes We have taped your toes together to keep them from moving. This is called "buddy taping" since we used a part of your own body to keep the injured part still. We placed soft padding between your toes to keep them from rubbing against each other. Buddy taping will help with healing and to reduce pain. Keep your toes buddy taped together for as long as directed by your caregiver. HOME CARE INSTRUCTIONS   Raise your injured area above the level of your heart while sitting or lying down. Prop it up with pillows.  An ice pack used every twenty minutes, while awake, for the first one to two days may be helpful. Put ice in a plastic bag and put a towel between the bag and your skin.  Watch for signs that the taping is too tight. These signs may be:  Numbness of your taped toes.  Coolness of your taped toes.  Color change in the area beyond the tape.  Increased pain.  If you have any of these signs, loosen or rewrap the tape. If you need to loosen or rewrap the buddy tape, make sure you use the padding again. SEEK IMMEDIATE MEDICAL CARE IF:   You have worse pain, swelling, inflammation (soreness), drainage or bleeding after you rewrap the tape.  Any new problems occur. MAKE SURE YOU:   Understand these instructions.  Will watch your condition.  Will get help right away if you are not doing well or get worse. Document Released: 03/18/2004 Document Revised: 09/06/2011 Document Reviewed: 06/11/2008 [facility] Patient Information 2014 [place].  Toe Fracture Your caregiver has diagnosed you as having a fractured toe. A toe fracture is a break in the bone of a toe. "Buddy taping" is a way of splinting your broken toe, by taping the broken toe to the toe next to it. This "buddy taping" will keep the injured toe from  moving beyond normal range of motion. Buddy taping also helps the toe heal in a more normal alignment. It may take 6 to 8 weeks for the toe injury to heal. Eads your toes taped together for as long as directed by your caregiver or until you see a doctor for a follow-up examination. You can change the tape after bathing. Always use a small piece of gauze or cotton between the toes when taping them together. This will help the skin stay dry and prevent infection.  Apply ice to the injury for 15-20 minutes each hour while awake for the first 2 days. Put the ice in a plastic bag and place a towel between the bag of ice and your skin.  After the first 2 days, apply heat to the injured area. Use heat for the next 2 to 3 days. Place a heating pad on the foot or soak the foot in warm water as directed by your caregiver.  Keep your foot elevated as much as possible to lessen swelling.  Wear sturdy, supportive shoes. The shoes should not pinch the toes or fit tightly against the toes.  Your caregiver may prescribe a rigid shoe if your foot is very swollen.  Your may be given crutches if the pain is too great and it hurts too much to walk.  Only take over-the-counter or prescription medicines  for pain, discomfort, or fever as directed by your caregiver.  If your caregiver has given you a follow-up appointment, it is very important to keep that appointment. Not keeping the appointment could result in a chronic or permanent injury, pain, and disability. If there is any problem keeping the appointment, you must call back to this facility for assistance. SEEK MEDICAL CARE IF:   You have increased pain or swelling, not relieved with medications.  The pain does not get better after 1 week.  Your injured toe is cold when the others are warm. SEEK IMMEDIATE MEDICAL CARE IF:   The toe becomes cold, numb, or white.  The toe becomes hot (inflamed) and red. Document Released:  06/11/2000 Document Revised: 09/06/2011 Document Reviewed: 01/29/2008 [facility] Patient Information 2014 [place].

## 2013-12-27 ENCOUNTER — Other Ambulatory Visit (HOSPITAL_COMMUNITY)
Admission: RE | Admit: 2013-12-27 | Discharge: 2013-12-27 | Disposition: A | Discharge status: Home / Self Care | Payer: Medicaid Other | Source: Ambulatory Visit | Attending: Family Medicine | Admitting: Family Medicine

## 2013-12-27 ENCOUNTER — Encounter (HOSPITAL_COMMUNITY): Payer: Self-pay | Admitting: Emergency Medicine

## 2013-12-27 ENCOUNTER — Emergency Department (INDEPENDENT_AMBULATORY_CARE_PROVIDER_SITE_OTHER)
Admission: EM | Admit: 2013-12-27 | Discharge: 2013-12-27 | Disposition: A | Discharge status: Home / Self Care | Payer: Self-pay | Source: Home / Self Care | Attending: Family Medicine | Admitting: Family Medicine

## 2013-12-27 DIAGNOSIS — N760 Acute vaginitis: Secondary | ICD-10-CM | POA: Insufficient documentation

## 2013-12-27 DIAGNOSIS — Z113 Encounter for screening for infections with a predominantly sexual mode of transmission: Secondary | ICD-10-CM | POA: Insufficient documentation

## 2013-12-27 MED ORDER — METRONIDAZOLE 500 MG PO TABS: 500.0000 mg | ORAL_TABLET | Freq: Two times a day (BID) | ORAL | Status: DC

## 2013-12-27 NOTE — Discharge Instructions (Signed)
Thank you for coming in today. If your belly pain worsens, or you have high fever, bad vomiting, blood in your stool or black tarry stool go to the Emergency Room.    Bacterial Vaginosis Bacterial vaginosis is an infection of the vagina. It happens when too many of certain germs (bacteria) grow in the vagina. HOME CARE  Take your medicine as told by your doctor.  Finish your medicine even if you start to feel better.  Do not have sex until you finish your medicine and are better.  Tell your sex partner that you have an infection. They should see their doctor for treatment.  Practice safe sex. Use condoms. Have only one sex partner. GET HELP IF:  You are not getting better after 3 days of treatment.  You have more grey fluid (discharge) coming from your vagina than before.  You have more pain than before.  You have a fever. MAKE SURE YOU:   Understand these instructions.  Will watch your condition.  Will get help right away if you are not doing well or get worse. Document Released: 03/23/2008 Document Revised: 04/04/2013 Document Reviewed: 01/24/2013 [facility] Patient Information 2015 [place], [place]. This information is not intended to replace advice given to you by your health care provider. Make sure you discuss any questions you have with your health care provider.   Trichomoniasis Trichomoniasis is an infection caused by an organism called Trichomonas. The infection can affect both women and men. In women, the outer female genitalia and the vagina are affected. In men, the penis is mainly affected, but the prostate and other reproductive organs can also be involved. Trichomoniasis is a sexually transmitted infection (STI) and is most often passed to another person through sexual contact.  RISK FACTORS  Having unprotected sexual intercourse.  Having sexual intercourse with an infected partner. SIGNS AND SYMPTOMS  Symptoms of trichomoniasis in women include:  Abnormal  gray-green frothy vaginal discharge.  Itching and irritation of the vagina.  Itching and irritation of the area outside the vagina. Symptoms of trichomoniasis in men include:   Penile discharge with or without pain.  Pain during urination. This results from inflammation of the urethra. DIAGNOSIS  Trichomoniasis may be found during a Pap test or physical exam. Your health care provider may use one of the following methods to help diagnose this infection:  Examining vaginal discharge under a microscope. For men, urethral discharge would be examined.  Testing the pH of the vagina with a test tape.  Using a vaginal swab test that checks for the Trichomonas organism. A test is available that provides results within a few minutes.  Doing a culture test for the organism. This is not usually needed. TREATMENT   You may be given medicine to fight the infection. Women should inform their health care provider if they could be or are pregnant. Some medicines used to treat the infection should not be taken during pregnancy.  Your health care provider may recommend over-the-counter medicines or creams to decrease itching or irritation.  Your sexual partner will need to be treated if infected. HOME CARE INSTRUCTIONS   Take all medicine prescribed by your health care provider.  Take over-the-counter medicine for itching or irritation as directed by your health care provider.  Do not have sexual intercourse while you have the infection.  Women should not douche or wear tampons while they have the infection.  Discuss your infection with your partner. Your partner may have gotten the infection from you, or  you may have gotten it from your partner.  Have your sex partner get examined and treated if necessary.  Practice safe, informed, and protected sex.  See your health care provider for other STI testing. SEEK MEDICAL CARE IF:   You still have symptoms after you finish your  medicine.  You develop abdominal pain.  You have pain when you urinate.  You have bleeding after sexual intercourse.  You develop a rash.  Your medicine makes you sick or makes you throw up (vomit). Document Released: 12/08/2000 Document Revised: 06/19/2013 Document Reviewed: 03/26/2013 [facility] Patient Information 2015 [place], [place]. This information is not intended to replace advice given to you by your health care provider. Make sure you discuss any questions you have with your health care provider.

## 2013-12-27 NOTE — ED Notes (Signed)
C/o vaginal discharge onset Mon.  Partner told her Sat that he had Trich.  Occ. lower abd. Pain.

## 2013-12-27 NOTE — ED Provider Notes (Signed)
Gina Ross is a 43 y.o. female who presents to Urgent Care today for vaginal discharge and odor present for 3 days. Her partner is concerned for Trichomonas. No fevers or chills nausea vomiting or diarrhea. Occasional mild abdominal pain. Patient feels well otherwise. No urinary symptoms.   History reviewed. No pertinent past medical history. History  Substance Use Topics  . Smoking status: Current Every Day Smoker -- 0.10 packs/day    Types: Cigarettes  . Smokeless tobacco: Not on file  . Alcohol Use: Yes     Comment: socailly   ROS as above Medications: No current facility-administered medications for this encounter.   Current Outpatient Prescriptions  Medication Sig Dispense Refill  . metroNIDAZOLE (FLAGYL) 500 MG tablet Take 1 tablet (500 mg total) by mouth 2 (two) times daily.  14 tablet  0    Exam:  BP 140/98  Pulse 89  Temp(Src) 98.5 F (36.9 C) (Oral)  Resp 18  SpO2 100%  LMP 12/18/2013 Gen: Well NAD HEENT: EOMI,  MMM Lungs: Normal work of breathing. CTABL Heart: RRR no MRG Abd: NABS, Soft. NT, ND Exts: Brisk capillary refill, warm and well perfused.  GYN: Normal external genitalia. Vaginal canal with thin white discharge. Normal-appearing cervix. Nontender.  No results found for this or any previous visit (from the past 24 hour(s)). No results found.  Assessment and Plan: 43 y.o. female with vaginitis. Empiric treatment with metronidazole. Cytology pending.  Discussed warning signs or symptoms. Please see discharge instructions. Patient expresses understanding.    Gregor Hams, MD 12/27/13 [phone]

## 2014-01-01 ENCOUNTER — Telehealth (HOSPITAL_COMMUNITY): Payer: Self-pay | Admitting: *Deleted

## 2014-01-01 NOTE — ED Notes (Signed)
GC/Chlamydia neg., Affirm: Candida neg., Gardnerella and Trich pos. Pt. adequately treated with Flagyl.  I called pt. Pt. verified x 2 and given results.  Pt. told she was adequately treated for both with the Flagyl and to finish all of her medication.  Pt. instructed to notify her partner to be treated with Flagyl, no sex until she has finished her medication and her partner has been treated and to practice safe sex. Pt. told she can get HIV testing at the [facility] STD clinic, by appointment.  She said she gets that every year. Roselyn Meier 01/01/2014

## 2014-03-16 ENCOUNTER — Emergency Department (HOSPITAL_COMMUNITY)
Admission: EM | Admit: 2014-03-16 | Discharge: 2014-03-16 | Disposition: A | Discharge status: Home / Self Care | Payer: Medicaid Other | Attending: Emergency Medicine | Admitting: Emergency Medicine

## 2014-03-16 ENCOUNTER — Encounter (HOSPITAL_COMMUNITY): Payer: Self-pay | Admitting: Emergency Medicine

## 2014-03-16 ENCOUNTER — Emergency Department (HOSPITAL_COMMUNITY): Payer: Medicaid Other

## 2014-03-16 DIAGNOSIS — S8990XA Unspecified injury of unspecified lower leg, initial encounter: Secondary | ICD-10-CM | POA: Insufficient documentation

## 2014-03-16 DIAGNOSIS — S82009A Unspecified fracture of unspecified patella, initial encounter for closed fracture: Secondary | ICD-10-CM | POA: Insufficient documentation

## 2014-03-16 DIAGNOSIS — Y9389 Activity, other specified: Secondary | ICD-10-CM | POA: Insufficient documentation

## 2014-03-16 DIAGNOSIS — J45909 Unspecified asthma, uncomplicated: Secondary | ICD-10-CM | POA: Insufficient documentation

## 2014-03-16 DIAGNOSIS — W010XXA Fall on same level from slipping, tripping and stumbling without subsequent striking against object, initial encounter: Secondary | ICD-10-CM | POA: Insufficient documentation

## 2014-03-16 DIAGNOSIS — Z792 Long term (current) use of antibiotics: Secondary | ICD-10-CM | POA: Insufficient documentation

## 2014-03-16 DIAGNOSIS — S99929A Unspecified injury of unspecified foot, initial encounter: Secondary | ICD-10-CM

## 2014-03-16 DIAGNOSIS — Y9289 Other specified places as the place of occurrence of the external cause: Secondary | ICD-10-CM | POA: Insufficient documentation

## 2014-03-16 DIAGNOSIS — S82001A Unspecified fracture of right patella, initial encounter for closed fracture: Secondary | ICD-10-CM

## 2014-03-16 DIAGNOSIS — F17200 Nicotine dependence, unspecified, uncomplicated: Secondary | ICD-10-CM | POA: Insufficient documentation

## 2014-03-16 DIAGNOSIS — X500XXA Overexertion from strenuous movement or load, initial encounter: Secondary | ICD-10-CM | POA: Insufficient documentation

## 2014-03-16 DIAGNOSIS — S99919A Unspecified injury of unspecified ankle, initial encounter: Secondary | ICD-10-CM

## 2014-03-16 HISTORY — DX: Unspecified asthma, uncomplicated: J45.909

## 2014-03-16 MED ORDER — OXYCODONE-ACETAMINOPHEN 5-325 MG PO TABS: 1.0000 | ORAL_TABLET | ORAL | Status: DC | PRN

## 2014-03-16 MED ORDER — HYDROMORPHONE HCL 1 MG/ML IJ SOLN
1.0000 mg | Freq: Once | INTRAMUSCULAR | Status: AC
  Administered 2014-03-16: 1 mg via INTRAVENOUS
  Filled 2014-03-16: qty 1

## 2014-03-16 MED ORDER — IBUPROFEN 800 MG PO TABS
800.0000 mg | ORAL_TABLET | Freq: Three times a day (TID) | ORAL | Status: DC
Start: 2014-03-16 — End: 2015-05-11

## 2014-03-16 MED ORDER — HYDROMORPHONE HCL 1 MG/ML IJ SOLN
0.5000 mg | Freq: Once | INTRAMUSCULAR | Status: AC
  Administered 2014-03-16: 0.5 mg via INTRAMUSCULAR
  Filled 2014-03-16: qty 1

## 2014-03-16 NOTE — Progress Notes (Signed)
Orthopedic Tech Progress Note Patient Details:  Gina Ross 05/09/1971 [id number]  Ortho Devices Type of Ortho Device: Knee Immobilizer;Crutches Ortho Device/Splint Interventions: Application   [name] 03/16/2014, 9:37 AM

## 2014-03-16 NOTE — ED Provider Notes (Signed)
CSN: [id number]     Arrival date & time 03/16/14  8099 History   First MD Initiated Contact with Patient 03/16/14 [phone]     Chief Complaint  Patient presents with  . Knee Injury     (Consider location/radiation/quality/duration/timing/severity/associated sxs/prior Treatment) HPI Comments: The patient is a 43 year old female without significant past history of present emergency room chief complaint of right leg pain after sustaining a fall just prior to arrival. The patient reports she twisted her ankle on the dance floor, do to heals and fell on her right knee. She reports 2 beers and a "cup of 1800 tequila".   The history is provided by the patient. No language interpreter was used.    Past Medical History  Diagnosis Date  . Asthma    No past surgical history on file. Family History  Problem Relation Age of Onset  . Hypertension Mother   . Diabetes Mother   . Hypertension Father    History  Substance Use Topics  . Smoking status: Current Every Day Smoker -- 0.10 packs/day    Types: Cigarettes  . Smokeless tobacco: Not on file  . Alcohol Use: Yes     Comment: socailly   OB History   Grav Para Term Preterm Abortions TAB SAB Ect Mult Living                 Review of Systems  Musculoskeletal: Positive for arthralgias, gait problem and joint swelling.  Skin: Negative for wound.  Neurological: Negative for syncope, light-headedness and headaches.      Allergies  Review of patient's allergies indicates no known allergies.  Home Medications   Prior to Admission medications   Medication Sig Start Date End Date Taking? Authorizing Provider  metroNIDAZOLE (FLAGYL) 500 MG tablet Take 1 tablet (500 mg total) by mouth 2 (two) times daily. 12/27/13   Gregor Hams, MD   BP 153/97  Pulse 120  Temp(Src) 97.8 F (36.6 C) (Oral)  Resp 16  Ht 5\' 9"  (1.753 m)  Wt 142 lb (64.411 kg)  BMI 20.96 kg/m2  SpO2 100% Physical Exam  Nursing note and vitals reviewed. Constitutional:  She is oriented to person, place, and time. She appears well-developed and well-nourished. No distress.  Neck: Neck supple.  Cardiovascular: Normal rate and regular rhythm.   Pulses:      Popliteal pulses are 2+ on the right side, and 2+ on the left side.  Pulmonary/Chest: Effort normal. No respiratory distress.  Musculoskeletal:       Right knee: She exhibits decreased range of motion and effusion. She exhibits no deformity. Tenderness found.       Right ankle: She exhibits no deformity and normal pulse. Tenderness. Lateral malleolus tenderness found.  Right lower extremity in splint. Large knee effusion. Decrease ROM secondary to pain.   Neurological: She is alert and oriented to person, place, and time.  Skin: Skin is warm and dry. She is not diaphoretic.    ED Course  Procedures (including critical care time) Labs Review Labs Reviewed - No data to display  Imaging Review Dg Knee 1-2 Views Right  03/16/2014   CLINICAL DATA:  Right knee pain following a fall.  EXAM: RIGHT KNEE - 1-2 VIEW  COMPARISON:  04/26/2010.  FINDINGS: AP and cross-table lateral views demonstrate an acute fracture through the mid patella with 2 mm of anterior displacement of the distal fragment. There is also a 2 mm of distraction of the fragments. There is an associated moderate to  large-sized effusion. No other fractures or dislocation seen.  IMPRESSION: Limited examination demonstrating and acute patellar fracture and moderate to large-sized effusion, as described above.   Electronically Signed   By: Enrique Sack M.D.   On: 03/16/2014 08:16   Dg Ankle 2 Views Right  03/16/2014   CLINICAL DATA:  Right ankle pain following an injury.  EXAM: RIGHT ANKLE - 2 VIEW  COMPARISON:  Right foot dated 12/09/2005.  FINDINGS: AP and cross-table lateral views of the right ankle demonstrate normal appearing bones and soft tissues. No fracture, dislocation or effusion seen.  IMPRESSION: Normal, limited examination.   Electronically  Signed   By: Enrique Sack M.D.   On: 03/16/2014 08:14     EKG Interpretation None      MDM   Final diagnoses:  Patellar fracture, right, closed, initial encounter   Pt with right knee injury, swelling noted on exam no obvious deformity, NV intact distally. Inially ordered complete joint views, however 2 views were performed. XR show patellar fracture. Plan to follow up with ortho, knee immobilizer, Ice, elevate, crutches, pain control. Dr. Alvino Chapel also evaluated the patient during this encounter. Discussed imaging results, and treatment plan with the patient. Return precautions given. Reports understanding and no other concerns at this time.  Patient is stable for discharge at this time. Meds given in ED:  Medications  HYDROmorphone (DILAUDID) injection 0.5 mg (0.5 mg Intramuscular Given 03/16/14 0730)  HYDROmorphone (DILAUDID) injection 1 mg (1 mg Intravenous Given 03/16/14 0836)    Discharge Medication List as of 03/16/2014  8:53 AM    START taking these medications   Details  !! ibuprofen (ADVIL,MOTRIN) 800 MG tablet Take 1 tablet (800 mg total) by mouth 3 (three) times daily., Starting 03/16/2014, Until Discontinued, Print    oxyCODONE-acetaminophen (PERCOCET/ROXICET) 5-325 MG per tablet Take 1-2 tablets by mouth every 4 (four) hours as needed for severe pain., Starting 03/16/2014, Until Discontinued, Print     !! - Potential duplicate medications found. Please discuss with provider.       Harvie Heck, PA-C 03/16/14 [phone]

## 2014-03-16 NOTE — ED Notes (Signed)
Patient was at a club and slipped on the floor with her high heels and feel on her right knee. Obvious swelling to her knee. Palpable pedal pulse. Patient has ETOH on board.

## 2014-03-16 NOTE — ED Notes (Signed)
Family at bedside. 

## 2014-03-16 NOTE — ED Notes (Signed)
Patient transported to X-ray 

## 2014-03-16 NOTE — ED Notes (Signed)
Ortho tech at bedside 

## 2014-03-16 NOTE — Discharge Instructions (Signed)
Call an orthopedist for further evaluation of your patellar fracture. Wear knee immobilizer at all times unless you are icing your knee. Use the crutches to help walk or stand. Return if Symptoms worsen.   Take medication as prescribed.  Ice your knee 3-4 times a day. Elevate your knee above your heart to reduce swelling. Do not operate heavy machinery or drink alcohol while taking narcotic pain medication.    Emergency Department Resource Guide 1) Find a Doctor and Pay Out of Pocket Although you won't have to find out who is covered by your insurance plan, it is a good idea to ask around and get recommendations. You will then need to call the office and see if the doctor you have chosen will accept you as a new patient and what types of options they offer for patients who are self-pay. Some doctors offer discounts or will set up payment plans for their patients who do not have insurance, but you will need to ask so you aren't surprised when you get to your appointment.  2) Contact Your Local Health Department Not all health departments have doctors that can see patients for sick visits, but many do, so it is worth a call to see if yours does. If you don't know where your local health department is, you can check in your phone book. The CDC also has a tool to help you locate your state's health department, and many state websites also have listings of all of their local health departments.  3) Find a [facility] [place] [facility] If your illness is not likely to be very severe or complicated, you may want to try a walk in clinic. These are popping up all over the country in pharmacies, drugstores, and shopping centers. They're usually staffed by nurse practitioners or physician assistants that have been trained to treat common illnesses and complaints. They're usually fairly quick and inexpensive. However, if you have serious medical issues or chronic medical problems, these are probably not your best  option.  No Primary Care Doctor: - Call Health Connect at  [phone] - they can help you locate a primary care doctor that  accepts your insurance, provides certain services, etc. - Physician Referral Service- [phone]  Chronic Pain Problems: Organization         Address  Phone   Notes  [facility]  [phone] Patients need to be referred by their primary care doctor.   Medication Assistance: Organization         Address  Phone   Notes  [facility] Medication [facility] [address] [place], [place] [address] [phone] --Must be a resident of [facility] -- Must have NO insurance coverage whatsoever (no Medicaid/ Medicare, etc.) -- The pt. MUST have a primary care doctor that directs their care regularly and follows them in the community   MedAssist  [phone]   [employer]  [phone]    Agencies that provide inexpensive medical care: Organization         Address  Phone   Notes  [place]  [phone]   [facility]    [phone]   [facility] [place], [place] [address] [phone]   [place] [phone]   Planned Parenthood    [phone]   [facility]    [phone]   Community Health and  [place] [address], [place] Phone:  [phone], Fax:  [phone] Hours of Operation:  9 am - 6 pm, M-F.  Also accepts Medicaid/Medicare and self-pay.  [facility] for [place], [address], [place] Phone: [phone], Fax: [phone]. Hours of Operation:  8:30 am - 5:30 pm, M-F.  Also accepts Medicaid and self-pay.  [facility] [address], [place] Phone: [phone]   [place], [place], [place] [phone], Ext. [phone] Mondays & Thursdays: 7-9 AM.  First 15  patients are seen on a first come, first serve basis.    [place] Providers:  Organization         Address  Phone   Notes  [facility] [address],  [phone] Also accepts self-pay patients.  [facility] [address], [place]  [phone]   [place], [address], [place] [phone]   [facility] [address] [place] [phone]   Lucianne Lei [address], [place]   [phone] Only accepts [place] Access [place] patients after they have their name applied to their card.   Self-Pay (no insurance) in [facility]:  Organization         Address  Phone   Notes  Sickle Cell Patients, [facility] [place] [phone]   [facility] [place] [phone]   [facility] [place]  [place], [place],  [phone]   [facility]/Dr. Osei-Bonsu  [address] [place] or [address], [place] [phone] Phone number for both [place] and [place] locations is the same.  [facility] [address] [place] [phone]   [facility] [address] [place] or [address] [phone]   [facility] [address], [place] [phone], phone; [phone], fax Sees patients 1st and 3rd Saturday of every month.  Must not qualify for public or private insurance (i.e. Medicaid, Medicare, [place] Health Choice, Veterans' Benefits)  Household income should be no more than 200% of the poverty level The clinic cannot treat you if you are pregnant or think you are pregnant  Sexually transmitted diseases are not treated at the clinic.    Dental  Care: Organization         Address  Phone  Notes  [facility] Department of [facility] [place] [phone] Accepts children up to age 59 who are enrolled in [place] or [place]; pregnant women with a Medicaid card; and children who have applied for Medicaid or [place] Health Choice, but were declined, whose parents can pay a reduced fee at time of service.  [facility] Department of [facility]  [address], [place] [phone] Accepts children up to age 80 who are enrolled in [place] or [place]; pregnant women with a Medicaid card; and children who have applied for Medicaid or [place] Health Choice, but were declined, whose parents can pay a reduced fee at time of service.  [place] Adult Dental Access PROGRAM  [place],  [place] [phone] Patients are seen by appointment only. Walk-ins are not accepted. [place] will see patients 51 years of age and older. Monday - Tuesday (8am-5pm) Most Wednesdays (8:30-5pm) $30 per visit, cash only  [facility] Adult Dental Access PROGRAM  [address], [facility] [phone] Patients are seen by appointment only. Walk-ins are not accepted. [place] will see patients 14 years of age and older. One Wednesday Evening (Monthly: Volunteer Based).  $30 per visit, cash only  [place]  [phone] for adults; Children under age 72, call Graduate Pediatric Dentistry at [phone]. Children aged 20-14, please call [phone] to request a pediatric application.  Dental services are provided in all areas of dental care including fillings, crowns and bridges, complete and partial dentures, implants, gum treatment, root canals, and extractions. Preventive care is also provided. Treatment is provided to both adults and children. Patients are selected via a lottery and there is often a waiting list.   [facility] [address], [place]  [phone] [email or web address]   [facility] [address] [place], [place] [phone], Ext. [phone] Second and Fourth Thursday of each month, opens at 6:30 AM; Clinic ends at 9 AM.  Patients are seen on a first-come first-served basis, and a limited number are seen during each clinic.   [facility]  [address] Hillard Danker [place], [place] [phone]   Eligibility Requirements You must have lived in [place], [place], or [place] counties for at least the last three months.   You cannot be eligible for state or federal sponsored [employer], including [employer], [place], or [employer].   You generally cannot be eligible for healthcare insurance through your employer.    How to apply: Eligibility screenings are held every Tuesday and Wednesday afternoon from 1:00 pm until 4:00 pm. You do not need an appointment for the interview!  [facility] [address] [place], [place]   [place]  [place] Department  [place]  [phone]    Behavioral Health Resources in the Community: Intensive Outpatient Programs Organization         Address  Phone  Notes  [place]. [address] [place], [place] [phone]   [facility] [address] [place], [place]   ADS: Alcohol & Drug Svcs [address] [place], [place]   [place] [address]  [place], [place] or [phone]   Substance Abuse Resources Organization         Address  Phone  Notes  Alcohol and Drug Services  [phone]   [place]  [phone]   [facility] [place]   Chinita Pester  [phone]   Residential & Outpatient Substance Abuse Program  [phone]    Psychological Services Organization         Address  Phone  Notes  [facility] [place]  [place]  [phone]   [place] [address] [place] or [phone]    Mobile Crisis Teams Organization         Address  Phone  Notes  [facility], Mobile Crisis Care Unit  [phone]   [facility]  [address] [place], [place]   [place] [phone]  [address] [place] [phone]    Self-Help/Support Groups Organization         Address  Phone             Notes  [facility]ental Health Assoc. of [place] - variety of support groups  [place] Call for more information  Narcotics Anonymous (NA), Caring Services [address], [employer] [place]  2 meetings at this location   Special educational needs teacher         Address  Phone  Notes  [facility]idential Treatment [place],    [place]  [phone]   [facility]  [address], [place] [address], [place], [place]   [place], [place] [phone] Admissions: 8am-3pm M-F  Incentives Substance [place] [address],    [place], [place] [phone]   [facility] [address] [place], [place], [place]   [facility] [address],  [place], [place]   [facility] Programs - Intensive Outpatient [address]een [address], [place], [place]   [facility] [place].,  [place], [place] [phone] or [phone]   [facility] Services (RTS) [address], [place], [place] Accepts Medicaid  [facility] [place] [address]  [place] [phone] Substance Abuse/Addiction Treatment   [facility] Organization         Address  Phone  Notes  [facility]  [phone]   Domenic Schwab, PhD [address] Arlis Porta [place], [place]   [phone] or [phone]   [place], [place] [phone]   [facility] [address], [place], [place] [phone] Insurance/Medicaid/sponsorship through [facility] and Families [address], [address] [place]                                    [place], [place] [phone] [place] [address], [place] [phone]    Dr. Adele Schilder  [phone]   [facility] Dept. 1) [address], [place] 2) [place] 3)  [place] [address], [place] [phone]  [phone]   [place] [phone] or [phone] (After Hours)

## 2014-03-19 NOTE — ED Provider Notes (Signed)
Medical screening examination/treatment/procedure(s) were conducted as a shared visit with non-physician practitioner(s) and myself.  I personally evaluated the patient during the encounter.   EKG Interpretation None     Patient with fall, patellar fracture. Unclear if patient can keep her knee straight, since she will not allow an attempt. Patient was given knee immobilizer and followup with orthopedic surgery  Jasper Riling. Alvino Chapel, MD 03/19/14 1501

## 2014-03-20 ENCOUNTER — Other Ambulatory Visit (HOSPITAL_COMMUNITY): Payer: Self-pay | Admitting: Orthopedic Surgery

## 2014-03-21 ENCOUNTER — Encounter (HOSPITAL_COMMUNITY): Payer: Self-pay | Admitting: Pharmacy Technician

## 2014-03-21 ENCOUNTER — Encounter (HOSPITAL_COMMUNITY): Payer: Self-pay | Admitting: *Deleted

## 2014-03-21 MED ORDER — CEFAZOLIN SODIUM-DEXTROSE 2-3 GM-% IV SOLR
2.0000 g | INTRAVENOUS | Status: AC
  Administered 2014-03-22: 2 g via INTRAVENOUS
  Filled 2014-03-21: qty 50

## 2014-03-22 ENCOUNTER — Ambulatory Visit (HOSPITAL_COMMUNITY)
Admission: RE | Admit: 2014-03-22 | Discharge: 2014-03-22 | Disposition: A | Discharge status: Home / Self Care | Payer: Medicaid Other | Source: Ambulatory Visit | Attending: Orthopedic Surgery | Admitting: Orthopedic Surgery

## 2014-03-22 ENCOUNTER — Encounter (HOSPITAL_COMMUNITY)
Admission: RE | Disposition: A | Discharge status: Home / Self Care | Payer: Self-pay | Source: Ambulatory Visit | Attending: Orthopedic Surgery

## 2014-03-22 ENCOUNTER — Encounter (HOSPITAL_COMMUNITY): Payer: Medicaid Other | Admitting: Anesthesiology

## 2014-03-22 ENCOUNTER — Ambulatory Visit (HOSPITAL_COMMUNITY): Payer: Medicaid Other | Admitting: Anesthesiology

## 2014-03-22 DIAGNOSIS — R296 Repeated falls: Secondary | ICD-10-CM | POA: Diagnosis not present

## 2014-03-22 DIAGNOSIS — F17200 Nicotine dependence, unspecified, uncomplicated: Secondary | ICD-10-CM | POA: Insufficient documentation

## 2014-03-22 DIAGNOSIS — S82009A Unspecified fracture of unspecified patella, initial encounter for closed fracture: Secondary | ICD-10-CM | POA: Diagnosis present

## 2014-03-22 DIAGNOSIS — S82001A Unspecified fracture of right patella, initial encounter for closed fracture: Secondary | ICD-10-CM

## 2014-03-22 HISTORY — PX: ORIF PATELLA: SHX5033

## 2014-03-22 LAB — APTT: APTT: 31 s (ref 24–37)

## 2014-03-22 LAB — CBC
HCT: 37.5 % (ref 36.0–46.0)
HEMOGLOBIN: 13.1 g/dL (ref 12.0–15.0)
MCH: 31.1 pg (ref 26.0–34.0)
MCHC: 34.9 g/dL (ref 30.0–36.0)
MCV: 89.1 fL (ref 78.0–100.0)
Platelets: 259 10*3/uL (ref 150–400)
RBC: 4.21 MIL/uL (ref 3.87–5.11)
RDW: 11.9 % (ref 11.5–15.5)
WBC: 5.5 10*3/uL (ref 4.0–10.5)

## 2014-03-22 LAB — COMPREHENSIVE METABOLIC PANEL
ALT: 11 U/L (ref 0–35)
AST: 18 U/L (ref 0–37)
Albumin: 3.8 g/dL (ref 3.5–5.2)
Alkaline Phosphatase: 49 U/L (ref 39–117)
Anion gap: 16 — ABNORMAL HIGH (ref 5–15)
BUN: 12 mg/dL (ref 6–23)
CO2: 21 mEq/L (ref 19–32)
Calcium: 9 mg/dL (ref 8.4–10.5)
Chloride: 100 mEq/L (ref 96–112)
Creatinine, Ser: 0.76 mg/dL (ref 0.50–1.10)
GFR calc Af Amer: >90 mL/min (ref >90)
GFR calc non Af Amer: >90 mL/min (ref >90)
GLUCOSE: 87 mg/dL (ref 70–99)
Potassium: 4.1 mEq/L (ref 3.7–5.3)
SODIUM: 137 meq/L (ref 137–147)
TOTAL PROTEIN: 7.7 g/dL (ref 6.0–8.3)
Total Bilirubin: 0.3 mg/dL (ref 0.3–1.2)

## 2014-03-22 LAB — PROTIME-INR
INR: 1.03 (ref 0.00–1.49)
Prothrombin Time: 13.5 seconds (ref 11.6–15.2)

## 2014-03-22 SURGERY — OPEN REDUCTION INTERNAL FIXATION (ORIF) PATELLA
Anesthesia: Regional | Site: Knee | Laterality: Right

## 2014-03-22 MED ORDER — LIDOCAINE HCL (CARDIAC) 20 MG/ML IV SOLN
INTRAVENOUS | Status: AC
  Filled 2014-03-22: qty 5

## 2014-03-22 MED ORDER — FENTANYL CITRATE 0.05 MG/ML IJ SOLN
INTRAMUSCULAR | Status: AC
  Filled 2014-03-22: qty 5

## 2014-03-22 MED ORDER — MIDAZOLAM HCL 2 MG/2ML IJ SOLN
INTRAMUSCULAR | Status: AC
  Filled 2014-03-22: qty 2

## 2014-03-22 MED ORDER — FENTANYL CITRATE 0.05 MG/ML IJ SOLN
INTRAMUSCULAR | Status: AC
  Filled 2014-03-22: qty 2

## 2014-03-22 MED ORDER — PHENYLEPHRINE HCL 10 MG/ML IJ SOLN
INTRAMUSCULAR | Status: DC | PRN
  Administered 2014-03-22 (×3): 40 ug via INTRAVENOUS

## 2014-03-22 MED ORDER — PHENYLEPHRINE 40 MCG/ML (10ML) SYRINGE FOR IV PUSH (FOR BLOOD PRESSURE SUPPORT)
PREFILLED_SYRINGE | INTRAVENOUS | Status: AC
  Filled 2014-03-22: qty 10

## 2014-03-22 MED ORDER — ONDANSETRON HCL 4 MG/2ML IJ SOLN: 4.0000 mg | Freq: Four times a day (QID) | INTRAMUSCULAR | Status: DC | PRN

## 2014-03-22 MED ORDER — HYDROMORPHONE HCL 1 MG/ML IJ SOLN
INTRAMUSCULAR | Status: AC
  Filled 2014-03-22: qty 1

## 2014-03-22 MED ORDER — FENTANYL CITRATE 0.05 MG/ML IJ SOLN
50.0000 ug | INTRAMUSCULAR | Status: DC | PRN
  Administered 2014-03-22: 100 ug via INTRAVENOUS

## 2014-03-22 MED ORDER — ONDANSETRON HCL 4 MG/2ML IJ SOLN
INTRAMUSCULAR | Status: AC
  Filled 2014-03-22: qty 2

## 2014-03-22 MED ORDER — OXYCODONE HCL 5 MG/5ML PO SOLN: 5.0000 mg | Freq: Once | ORAL | Status: DC | PRN

## 2014-03-22 MED ORDER — PROPOFOL 10 MG/ML IV BOLUS
INTRAVENOUS | Status: DC | PRN
  Administered 2014-03-22: 180 mg via INTRAVENOUS

## 2014-03-22 MED ORDER — PROPOFOL 10 MG/ML IV BOLUS
INTRAVENOUS | Status: AC
  Filled 2014-03-22: qty 20

## 2014-03-22 MED ORDER — LACTATED RINGERS IV SOLN
INTRAVENOUS | Status: DC
  Administered 2014-03-22 (×2): via INTRAVENOUS

## 2014-03-22 MED ORDER — FENTANYL CITRATE 0.05 MG/ML IJ SOLN
INTRAMUSCULAR | Status: DC | PRN
  Administered 2014-03-22: 100 ug via INTRAVENOUS
  Administered 2014-03-22: 50 ug via INTRAVENOUS

## 2014-03-22 MED ORDER — OXYCODONE-ACETAMINOPHEN 7.5-325 MG PO TABS: 1.0000 | ORAL_TABLET | ORAL | Status: DC | PRN

## 2014-03-22 MED ORDER — 0.9 % SODIUM CHLORIDE (POUR BTL) OPTIME
TOPICAL | Status: DC | PRN
  Administered 2014-03-22: 1000 mL

## 2014-03-22 MED ORDER — HYDROMORPHONE HCL 1 MG/ML IJ SOLN
0.2500 mg | INTRAMUSCULAR | Status: DC | PRN
  Administered 2014-03-22 (×2): 0.5 mg via INTRAVENOUS

## 2014-03-22 MED ORDER — MIDAZOLAM HCL 2 MG/2ML IJ SOLN
1.0000 mg | INTRAMUSCULAR | Status: DC | PRN
  Administered 2014-03-22: 2 mg via INTRAVENOUS

## 2014-03-22 MED ORDER — BUPIVACAINE-EPINEPHRINE (PF) 0.5% -1:200000 IJ SOLN
INTRAMUSCULAR | Status: DC | PRN
  Administered 2014-03-22: 30 mL via PERINEURAL

## 2014-03-22 MED ORDER — OXYCODONE HCL 5 MG PO TABS: 5.0000 mg | ORAL_TABLET | Freq: Once | ORAL | Status: DC | PRN

## 2014-03-22 MED ORDER — LIDOCAINE HCL (CARDIAC) 20 MG/ML IV SOLN
INTRAVENOUS | Status: DC | PRN
  Administered 2014-03-22: 60 mg via INTRAVENOUS

## 2014-03-22 MED ORDER — ONDANSETRON HCL 4 MG/2ML IJ SOLN
INTRAMUSCULAR | Status: DC | PRN
  Administered 2014-03-22: 4 mg via INTRAVENOUS

## 2014-03-22 SURGICAL SUPPLY — 51 items
BANDAGE ELASTIC 4 VELCRO ST LF (GAUZE/BANDAGES/DRESSINGS) ×1 IMPLANT
BANDAGE ELASTIC 6 VELCRO ST LF (GAUZE/BANDAGES/DRESSINGS) IMPLANT
BLADE SURG ROTATE 9660 (MISCELLANEOUS) ×1 IMPLANT
BNDG COHESIVE 6X5 TAN STRL LF (GAUZE/BANDAGES/DRESSINGS) ×3 IMPLANT
BNDG GAUZE ELAST 4 BULKY (GAUZE/BANDAGES/DRESSINGS) ×2 IMPLANT
COVER SURGICAL LIGHT HANDLE (MISCELLANEOUS) ×3 IMPLANT
CUFF TOURNIQUET SINGLE 34IN LL (TOURNIQUET CUFF) IMPLANT
CUFF TOURNIQUET SINGLE 44IN (TOURNIQUET CUFF) IMPLANT
DRAPE C-ARM 42X72 X-RAY (DRAPES) IMPLANT
DRAPE INCISE IOBAN 66X45 STRL (DRAPES) ×2 IMPLANT
DRAPE OEC MINIVIEW 54X84 (DRAPES) ×3 IMPLANT
DRSG ADAPTIC 3X8 NADH LF (GAUZE/BANDAGES/DRESSINGS) ×3 IMPLANT
DRSG PAD ABDOMINAL 8X10 ST (GAUZE/BANDAGES/DRESSINGS) ×3 IMPLANT
ELECT REM PT RETURN 9FT ADLT (ELECTROSURGICAL) ×3
ELECTRODE REM PT RTRN 9FT ADLT (ELECTROSURGICAL) ×1 IMPLANT
GAUZE SPONGE 4X4 12PLY STRL (GAUZE/BANDAGES/DRESSINGS) ×3 IMPLANT
GLOVE BIO SURGEON STRL SZ 6.5 (GLOVE) ×1 IMPLANT
GLOVE BIO SURGEONS STRL SZ 6.5 (GLOVE) ×1
GLOVE BIOGEL PI IND STRL 7.0 (GLOVE) ×1 IMPLANT
GLOVE BIOGEL PI IND STRL 9 (GLOVE) ×1 IMPLANT
GLOVE BIOGEL PI INDICATOR 7.0 (GLOVE) ×2
GLOVE BIOGEL PI INDICATOR 9 (GLOVE) ×2
GLOVE SURG ORTHO 9.0 STRL STRW (GLOVE) ×3 IMPLANT
GOWN STRL REUS W/ TWL LRG LVL3 (GOWN DISPOSABLE) ×1 IMPLANT
GOWN STRL REUS W/ TWL XL LVL3 (GOWN DISPOSABLE) ×2 IMPLANT
GOWN STRL REUS W/TWL LRG LVL3 (GOWN DISPOSABLE) ×3
GOWN STRL REUS W/TWL XL LVL3 (GOWN DISPOSABLE) ×6
IMMOBILIZER KNEE 22 UNIV (SOFTGOODS) ×3 IMPLANT
KIT BASIN OR (CUSTOM PROCEDURE TRAY) ×3 IMPLANT
KIT ROOM TURNOVER OR (KITS) ×3 IMPLANT
NS IRRIG 1000ML POUR BTL (IV SOLUTION) ×3 IMPLANT
PACK ORTHO EXTREMITY (CUSTOM PROCEDURE TRAY) ×3 IMPLANT
PAD ARMBOARD 7.5X6 YLW CONV (MISCELLANEOUS) ×4 IMPLANT
PAD CAST 4YDX4 CTTN HI CHSV (CAST SUPPLIES) IMPLANT
PADDING CAST COTTON 4X4 STRL (CAST SUPPLIES)
SPONGE LAP 4X18 X RAY DECT (DISPOSABLE) ×6 IMPLANT
STAPLER VISISTAT 35W (STAPLE) ×3 IMPLANT
STOCKINETTE IMPERVIOUS LG (DRAPES) ×3 IMPLANT
SUCTION FRAZIER TIP 10 FR DISP (SUCTIONS) ×3 IMPLANT
SUT ETHILON 3 0 FSL (SUTURE) IMPLANT
SUT STEEL 5 (SUTURE) ×1 IMPLANT
SUT VIC AB 0 CT1 27 (SUTURE) ×3
SUT VIC AB 0 CT1 27XBRD ANBCTR (SUTURE) ×1 IMPLANT
SUT VIC AB 2-0 CT1 27 (SUTURE)
SUT VIC AB 2-0 CT1 TAPERPNT 27 (SUTURE) IMPLANT
TOWEL OR 17X24 6PK STRL BLUE (TOWEL DISPOSABLE) ×3 IMPLANT
TOWEL OR 17X26 10 PK STRL BLUE (TOWEL DISPOSABLE) ×3 IMPLANT
TUBE CONNECTING 12'X1/4 (SUCTIONS) ×1
TUBE CONNECTING 12X1/4 (SUCTIONS) ×2 IMPLANT
UNDERPAD 30X30 INCONTINENT (UNDERPADS AND DIAPERS) ×3 IMPLANT
YANKAUER SUCT BULB TIP NO VENT (SUCTIONS) IMPLANT

## 2014-03-22 NOTE — Anesthesia Preprocedure Evaluation (Signed)
Anesthesia Evaluation  Patient identified by MRN, date of birth, ID band Patient awake    Reviewed: Allergy & Precautions, H&P , NPO status , Patient's Chart, lab work & pertinent test results  Airway Mallampati: II  Neck ROM: full    Dental   Pulmonary asthma , Current Smoker,          Cardiovascular negative cardio ROS      Neuro/Psych    GI/Hepatic   Endo/Other    Renal/GU      Musculoskeletal   Abdominal   Peds  Hematology   Anesthesia Other Findings   Reproductive/Obstetrics                           Anesthesia Physical Anesthesia Plan  ASA: II  Anesthesia Plan: General and Regional   Post-op Pain Management: MAC Combined w/ Regional for Post-op pain   Induction: Intravenous  Airway Management Planned: LMA  Additional Equipment:   Intra-op Plan:   Post-operative Plan:   Informed Consent: I have reviewed the patients History and Physical, chart, labs and discussed the procedure including the risks, benefits and alternatives for the proposed anesthesia with the patient or authorized representative who has indicated his/her understanding and acceptance.     Plan Discussed with: CRNA, Anesthesiologist and Surgeon  Anesthesia Plan Comments:         Anesthesia Quick Evaluation

## 2014-03-22 NOTE — Discharge Instructions (Signed)
Use knee immobilizer at all times. Weightbearing as tolerated. Keep foot elevated above the heart.  What to eat:  For your first meals, you should eat lightly; only small meals initially.  If you do not have nausea, you may eat larger meals.  Avoid spicy, greasy and heavy food.    General Anesthesia, Adult, Care After  Refer to this sheet in the next few weeks. These instructions provide you with information on caring for yourself after your procedure. Your health care provider may also give you more specific instructions. Your treatment has been planned according to current medical practices, but problems sometimes occur. Call your health care provider if you have any problems or questions after your procedure.  WHAT TO EXPECT AFTER THE PROCEDURE  After the procedure, it is typical to experience:  Sleepiness.  Nausea and vomiting. HOME CARE INSTRUCTIONS  For the first 24 hours after general anesthesia:  Have a responsible person with you.  Do not drive a car. If you are alone, do not take public transportation.  Do not drink alcohol.  Do not take medicine that has not been prescribed by your health care provider.  Do not sign important papers or make important decisions.  You may resume a normal diet and activities as directed by your health care provider.  Change bandages (dressings) as directed.  If you have questions or problems that seem related to general anesthesia, call the hospital and ask for the anesthetist or anesthesiologist on call. SEEK MEDICAL CARE IF:  You have nausea and vomiting that continue the day after anesthesia.  You develop a rash. SEEK IMMEDIATE MEDICAL CARE IF:  You have difficulty breathing.  You have chest pain.  You have any allergic problems. Document Released: 09/20/2000 Document Revised: 02/14/2013 Document Reviewed: 12/28/2012  [facility] Patient Information 2014 [place], [place].

## 2014-03-22 NOTE — Op Note (Signed)
03/22/2014  3:39 PM  PATIENT:  Gina Ross    PRE-OPERATIVE DIAGNOSIS:  Right Patella Fracture  POST-OPERATIVE DIAGNOSIS:  Same  PROCEDURE:  Open Reduction Internal Fixation Right Patella   SURGEON:  Newt Minion, MD  PHYSICIAN ASSISTANT:None ANESTHESIA:   General  PREOPERATIVE INDICATIONS:  Gina Ross is a  43 y.o. female with a diagnosis of Right Patella Fracture who failed conservative measures and elected for surgical management.    The risks benefits and alternatives were discussed with the patient preoperatively including but not limited to the risks of infection, bleeding, nerve injury, cardiopulmonary complications, the need for revision surgery, among others, and the patient was willing to proceed.  OPERATIVE IMPLANTS: 1.6 mm K wires x2. 18-gauge wire.  OPERATIVE FINDINGS: Radiograph shows stable alignment.  OPERATIVE PROCEDURE: Patient is a 43 year old woman who fell on her knee sustaining a displaced patella fracture. Patient presented this time for open reduction internal fixation. Risks and benefits were discussed including infection neurovascular injury failure of fixation need to remove hardware arthritis. Patient states she understands was to proceed at this time. Description of procedure patient was brought to the operating room and underwent a general anesthetic. After adequate levels of anesthesia were obtained patient's right lower extremity was prepped using DuraPrep draped into a sterile field Ioban was used to cover all exposed skin. A midline incision was made this was carried down to the fracture site. The fracture margins were freshened. K wires they were then was inserted  retrograde on the distal fragment the patella was reduced and these were then inserted antegrade through the proximal pole. 18-gauge wire was used to stabilize the construct in a figure-of-eight technique. C-arm fluoroscopy verified reduction. The wires were trimmed. The retinaculum  was closed using 0 Vicryl. Skin was closed using staples. A sterile compressive dressing was applied. Patient was placed in knee immobilizer taken to the PACU in stable condition plan for discharge to home.

## 2014-03-22 NOTE — H&P (Signed)
Gina Ross is an 43 y.o. female.   Chief Complaint: Right patella fracture HPI: Patient is a 43 year old woman who fell on her right knee sustaining a displaced fracture of the patella  Past Medical History  Diagnosis Date  . Asthma     as a child    Past Surgical History  Procedure Laterality Date  . Colonoscopy      Family History  Problem Relation Age of Onset  . Hypertension Mother   . Diabetes Mother   . Hypertension Father    Social History:  reports that she has been smoking Cigarettes.  She has been smoking about 0.10 packs per day. She does not have any smokeless tobacco history on file. She reports that she drinks alcohol. She reports that she does not use illicit drugs.  Allergies: No Known Allergies  No prescriptions prior to admission    No results found for this or any previous visit (from the past 48 hour(s)). No results found.  Review of Systems  All other systems reviewed and are negative.   Last menstrual period 03/01/2014. Physical Exam  On examination patient has a palpable defect of the right patella. She cannot do a straight leg raise. Radiograph shows a displaced fracture. Assessment/Plan Assessment: Displaced fracture right patella.  Plan: Will plan for open reduction internal fixation. Risks and benefits were discussed including failure of fixation arthritis need for additional surgery. Patient states she understands and wishes to proceed at this time.  Marceil Welp V 03/22/2014, 6:21 AM

## 2014-03-22 NOTE — Progress Notes (Signed)
Patient states that even though she is stating her pain a 9 she wants and is ready to go home. I explained to her that our goal to go home was when she is at a tolerable pain level. Patient states "I am ready,let's go" VSS. Patient taken over to phase 2

## 2014-03-22 NOTE — Progress Notes (Signed)
Pt refuses to have preg. Test done.  States there is no way she is pregnant, just had period and no sexual activity.

## 2014-03-22 NOTE — Transfer of Care (Signed)
Immediate Anesthesia Transfer of Care Note  Patient: Gina Ross  Procedure(s) Performed: Procedure(s): Open Reduction Internal Fixation Right Patella  (Right)  Patient Location: PACU  Anesthesia Type:General  Level of Consciousness: sedated  Airway & Oxygen Therapy: Patient Spontanous Breathing and Patient connected to nasal cannula oxygen  Post-op Assessment: Report given to PACU RN and Post -op Vital signs reviewed and stable  Post vital signs: stable  Complications: No apparent anesthesia complications

## 2014-03-22 NOTE — Anesthesia Postprocedure Evaluation (Signed)
Anesthesia Post Note  Patient: Gina Ross  Procedure(s) Performed: Procedure(s) (LRB): Open Reduction Internal Fixation Right Patella  (Right)  Anesthesia type: general  Patient location: PACU  Post pain: Pain level controlled  Post assessment: Patient's Cardiovascular Status Stable  Last Vitals:  Filed Vitals:   03/22/14 1628  BP: 127/89  Pulse: 75  Temp: 36.1 C  Resp: 15    Post vital signs: Reviewed and stable  Level of consciousness: sedated  Complications: No apparent anesthesia complications

## 2014-03-22 NOTE — Anesthesia Procedure Notes (Signed)
Anesthesia Regional Block:  Femoral nerve block  Pre-Anesthetic Checklist: ,, timeout performed, Correct Patient, Correct Site, Correct Laterality, Correct Procedure,, site marked, risks and benefits discussed, Surgical consent,  Pre-op evaluation,  At surgeon's request and post-op pain management  Laterality: Right  Prep: chloraprep       Needles:  Injection technique: Single-shot  Needle Type: Echogenic Stimulator Needle     Needle Length: 9cm 9 cm Needle Gauge: 21 and 21 G    Additional Needles:  Procedures: nerve stimulator Femoral nerve block  Nerve Stimulator or Paresthesia:  Response: Quadriceps muscle contraction, 0.45 mA,   Additional Responses:   Narrative:  Start time: 03/22/2014 1:57 PM End time: 03/22/2014 2:09 PM Injection made incrementally with aspirations every 5 mL.  Performed by: Personally  Anesthesiologist: Dr Marcie Bal  Additional Notes: Functioning IV was confirmed and monitors were applied.  A 27mm 21ga Arrow echogenic stimulator needle was used. Sterile prep and drape,hand hygiene and sterile gloves were used.  Negative aspiration and negative test dose prior to incremental administration of local anesthetic. The patient tolerated the procedure well.

## 2014-03-26 ENCOUNTER — Encounter (HOSPITAL_COMMUNITY): Payer: Self-pay | Admitting: Orthopedic Surgery

## 2014-05-28 ENCOUNTER — Telehealth: Payer: Self-pay | Admitting: *Deleted

## 2014-05-28 ENCOUNTER — Ambulatory Visit: Payer: Medicaid Other | Attending: Orthopedic Surgery | Admitting: Physical Therapy

## 2014-05-28 DIAGNOSIS — M25661 Stiffness of right knee, not elsewhere classified: Secondary | ICD-10-CM | POA: Insufficient documentation

## 2014-05-28 DIAGNOSIS — M25561 Pain in right knee: Secondary | ICD-10-CM | POA: Diagnosis not present

## 2014-05-28 NOTE — Therapy (Signed)
Physical Therapy Evaluation  Patient Details  Name: Gina Ross MRN: [id number] Date of Birth: [birth year]  Encounter Date: 05/28/2014      PT End of Session - 05/28/14 1005    Visit Number 1   Number of Visits 4   Date for PT Re-Evaluation 07/27/14   PT Start Time 0930   PT Stop Time 1000   PT Time Calculation (min) 30 min   Activity Tolerance Patient tolerated treatment well;Patient limited by pain   Behavior During Therapy [facility] for tasks assessed/performed      Past Medical History  Diagnosis Date  . Asthma     as a child    Past Surgical History  Procedure Laterality Date  . Colonoscopy    . Orif patella Right 03/22/2014    Procedure: Open Reduction Internal Fixation Right Patella ;  Surgeon: Newt Minion, MD;  Location: [place];  Service: Orthopedics;  Laterality: Right;    There were no vitals taken for this visit.  Visit Diagnosis:  Knee pain, acute, right - Plan: PT plan of care cert/re-cert  Knee stiffness, right - Plan: PT plan of care cert/re-cert      Subjective Assessment - 05/28/14 0934    Symptoms Pt presents to OPPT s/p R patella ORIF due to fall on 03/15/2014.  Pt had surgery 03/22/14.  Pt presents today with R knee immobilizer, and reports MD as allowed pt to take it off at home.   Limitations Walking;Standing   How long can you stand comfortably? 20 min   Patient Stated Goals improve movement, ROM and mobility   Currently in Pain? Yes   Pain Score 5    Pain Location Knee   Pain Orientation Right   Pain Descriptors / Indicators Shooting;Burning   Pain Type Surgical pain   Pain Onset More than a month ago   Pain Frequency Constant  shooting pain intermittent   Aggravating Factors  elevation   Pain Relieving Factors dependent position          [facility] PT Assessment - 05/28/14 0938    Assessment   Medical Diagnosis R patella ORIF   Onset Date 03/15/14  surgery 03/22/14   Next MD Visit 05/30/14   Prior Therapy n/a   Precautions   Precautions None   Required Braces or Orthoses Knee Immobilizer - Right   Knee Immobilizer - Right Other (comment)  PRN   Restrictions   Weight Bearing Restrictions No   Balance Screen   Has the patient fallen in the past 6 months Yes   How many times? 1   Has the patient had a decrease in activity level because of a fear of falling?  No   Is the patient reluctant to leave their home because of a fear of falling?  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; walking/running   Cognition   Overall Cognitive Status Within Functional Limits for tasks assessed   AROM   Right Knee  Extension 2  from neutral   Right Knee Flexion 44   Strength   Overall Strength Comments 15 degree extensor lag   Right Hip Flexion 3-/5   Right Hip Extension 3-/5   Right Hip ABduction 3/5   Right Knee Flexion 3-/5   Right Knee Extension 3+/5   Right Ankle Dorsiflexion 3/5   Ambulation/Gait   Ambulation/Gait Yes   Ambulation/Gait Assistance 5: Supervision   Ambulation Distance (Feet) 100 Feet   Assistive device None   Gait Pattern Step-to pattern;Decreased stance time - right;Decreased hip/knee flexion - right;Right circumduction;Antalgic   Gait velocity decreased            PT Education - 05/28/14 1005    Education provided Yes   Education Details HEP   Person(s) Educated Patient   Methods Explanation;Demonstration;Handout;Tactile cues;Verbal cues   Comprehension Verbalized  understanding;Returned demonstration;Verbal cues required;Tactile cues required;Need further instruction            PT Long Term Goals - 05/28/14 1009    PT LONG TERM GOAL #1   Title independent with HEP (07/24/14)   Baseline no HEP   Time 8   Period Weeks   Status New   PT LONG TERM GOAL #2   Title improve active R knee ROM to 0-80 for improved function and mobility (07/24/14)   Baseline 2-44   Time 8   Period Weeks   Status New   PT LONG TERM GOAL #3   Title demonstrate at least 3 reps straight leg raise without extensor lag for improved strength (07/24/14)   Baseline 15 degrees R knee extension lag with straight leg raise   Time 8   Period Weeks   Status New   PT LONG TERM GOAL #4   Title report pain < 2/10 with activity (07/24/14)   Baseline 5/10   Time 8   Period Weeks   Status New          Plan - 05/28/14 1007    Clinical Impression Statement Pt presents to OPPT with significant functional limitations s/p R patella ORIF.  Will benefit from PT to maximize functional mobility and decrease pain.   Pt will benefit from skilled therapeutic intervention in order to improve on the following deficits Abnormal gait;Decreased activity tolerance;Decreased mobility;Decreased strength;Pain;Decreased scar mobility;Increased edema;Decreased endurance;Difficulty walking;Decreased range of motion;Impaired flexibility   Rehab Potential Good   PT Frequency 1x / week   PT Duration 8 weeks  1 visit every other week; requesting 3 visits over 8 weeks   PT Treatment/Interventions ADLs/Self Care Home Management;Therapeutic activities;Patient/family education;Scar mobilization;Passive range of motion;Therapeutic exercise;Moist Heat;Ultrasound;Gait training;Balance training;Manual techniques;Manual lymph drainage;Neuromuscular re-education;Stair training;Cryotherapy;Electrical Stimulation;Functional mobility training;Compression bandaging   PT Next Visit Plan review HEP; progress ROM and  strengthening   Consulted and Agree with Plan of Care Patient        Problem List There are no active problems to display for this patient.                                          Laureen Abrahams, PT, DPT 05/28/2014 10:15 AM [address] [phone] (office) [phone] (fax)

## 2014-05-28 NOTE — Patient Instructions (Signed)
    Ankle Pump  Bend ankles up and down, alternating feet. Repeat __10__ times. Do __2-3__ sessions per day.  Quad Set  With other leg bent, foot flat, slowly tighten muscles on thigh of straight leg while counting out loud to _5___. Repeat with other leg. Repeat __10__ times. Do __2-3__ sessions per day.    Heel Slide  Bend left knee and pull heel toward buttocks. Use sheet, strap, or rope to actively assist knee further into flexion. Hold stretch 10 secs.  Repeat _10___ times. Do __2-3__ sessions per day.

## 2014-05-28 NOTE — Telephone Encounter (Signed)
appts made and printed...td 

## 2014-06-11 ENCOUNTER — Telehealth: Payer: Self-pay | Admitting: *Deleted

## 2014-06-11 ENCOUNTER — Ambulatory Visit: Payer: Medicaid Other | Admitting: Physical Therapy

## 2014-06-11 DIAGNOSIS — M25561 Pain in right knee: Secondary | ICD-10-CM

## 2014-06-11 DIAGNOSIS — M25661 Stiffness of right knee, not elsewhere classified: Secondary | ICD-10-CM

## 2014-06-11 NOTE — Therapy (Signed)
Outpatient Rehabilitation [facility] [address] [place], [place], [address] Phone: [phone]   Fax:  [phone]  Physical Therapy Treatment  Patient Details  Name: Gina Ross MRN: [id number] Date of Birth: 1970/12/09  Encounter Date: 06/11/2014      PT End of Session - 06/11/14 0959    Visit Number 2   Number of Visits 4   Date for PT Re-Evaluation 07/27/14   PT Start Time 0942   PT Stop Time 1032   PT Time Calculation (min) 50 min   Activity Tolerance Patient tolerated treatment well;Patient limited by pain   Behavior During Therapy [facility] for tasks assessed/performed      Past Medical History  Diagnosis Date  . Asthma     as a child    Past Surgical History  Procedure Laterality Date  . Colonoscopy    . Orif patella Right 03/22/2014    Procedure: Open Reduction Internal Fixation Right Patella ;  Surgeon: Newt Minion, MD;  Location: [place];  Service: Orthopedics;  Laterality: Right;    There were no vitals taken for this visit.  Visit Diagnosis:  Knee pain, acute, right  Knee stiffness, right      Subjective Assessment - 06/11/14 0946    Symptoms R knee feels stiff today. Out of work x  5 more weeks.   Limitations Walking;Standing   How long can you stand comfortably? 15 min   Patient Stated Goals improve movement, ROM and mobility   Currently in Pain? Yes   Pain Score 8    Pain Location Knee   Pain Orientation Right   Pain Descriptors / Indicators Burning;Shooting   Pain Type Surgical pain   Pain Onset More than a month ago   Pain Frequency Constant   Aggravating Factors  elevation   Pain Relieving Factors dependent position          [facility] PT Assessment - 06/11/14 1001    AROM   Right Knee Flexion 100          [facility] Adult PT Treatment/Exercise - 06/11/14 0948    Exercises   Exercises Knee/Hip   Knee/Hip Exercises: Aerobic   Stationary Bike recumbent bike no resistance for ROM x 8 min   Knee/Hip Exercises: Seated   Heel Slides AAROM;Right;5 reps   Knee/Hip Exercises: Supine   Quad Sets Right;10 reps   Short Arc Quad Sets Strengthening;Right;10 reps   Heel Slides Right;AAROM;10 reps  with strap   Other Supine Knee Exercises ankle pumps x 10 reps   Modalities   Modalities Cryotherapy   Cryotherapy   Number Minutes Cryotherapy 15 Minutes   Cryotherapy Location Knee   Type of Cryotherapy Other (comment)  vasopneumatic mod pressure          PT Education - 06/11/14 1020    Education provided Yes   Education Details HEP   Person(s) Educated Patient   Methods Explanation;Demonstration;Handout;Tactile cues;Verbal cues   Comprehension Verbalized understanding;Returned demonstration;Need further instruction            PT Long Term Goals - 06/11/14 1022    PT LONG TERM GOAL #1   Title independent with HEP (07/24/14)   Baseline no HEP   Time 8   Period Weeks   Status On-going   PT LONG TERM GOAL #2   Title improve active R knee ROM to 0-80 for improved function and mobility (07/24/14)   Baseline 2-44   Time 8   Period Weeks   Status Partially Met  PT LONG TERM GOAL #3   Title demonstrate at least 3 reps straight leg raise without extensor lag for improved strength (07/24/14)   Baseline 15 degrees R knee extension lag with straight leg raise   Time 8   Period Weeks   Status On-going   PT LONG TERM GOAL #4   Title report pain < 2/10 with activity (07/24/14)   Baseline 5/10   Time 8   Period Weeks   Status On-going          Plan - 06/11/14 1021    Clinical Impression Statement Pt with improved R knee flexion today and continues to progress mobility.  Will continue to benefit from PT to maximize functional mobility and decrease pain.   PT Next Visit Plan progress HEP, ROM/strengthening   Consulted and Agree with Plan of Care Patient                               Problem List There are no active problems to display for this patient.    Laureen Abrahams, PT, DPT 06/11/2014 10:34 AM  [facility] [address], [place] [address]  [phone] (office) [phone] (fax)

## 2014-06-11 NOTE — Telephone Encounter (Signed)
APPTS MADE AND PRINTED...TD 

## 2014-06-11 NOTE — Patient Instructions (Signed)
Short [employer] a large can or rolled towel under leg. Straighten knee and leg. Hold _3-5___ seconds.  Repeat _10___ times. Do _2-3___ sessions per day.  http://gt2.exer.us/366   Copyright  VHI. All rights reserved.   Laureen Abrahams, PT, DPT 06/11/2014 10:20 AM  [facility] [address] [place] [address]  [phone] (office) [phone] (fax)

## 2014-06-25 ENCOUNTER — Ambulatory Visit: Payer: Medicaid Other | Admitting: Rehabilitation

## 2014-06-25 DIAGNOSIS — M25661 Stiffness of right knee, not elsewhere classified: Secondary | ICD-10-CM

## 2014-06-25 DIAGNOSIS — M25561 Pain in right knee: Secondary | ICD-10-CM

## 2014-06-25 NOTE — Patient Instructions (Addendum)
FLEXION: Sitting (Active)   Sit, both feet flat. Lift right knee toward ceiling. Use __0_ lbs. Complete __2_ sets of _10_ repetitions.  HOLD 5 Seconds Perform __2-3_ sessions per day.  Copyright  VHI. All rights reserved.  Long [employer]   Straighten operated leg and try to hold it ___5_ seconds. Use ___0_ lbs on ankle. Repeat ___10_ times Do 2 sets . Do _2-3__ sessions a day.  http://gt2.exer.us/311   Copyright  VHI. All rights reserved.  ROM: Heel Prop   Lie with pillow under right heel. Tighten the muscles on the top of leg while trying to push knee toward the floor. Hold _5___ seconds. Repeat ___10-20_ times. Do _2-3___ sessions per day.  http://gt2.exer.us/288   Copyright  VHI. All rights reserved.  Hamstring Stretch - Supine   Lie on back, uninvolved leg raised toward ceiling, towel around knee. Pull thigh toward chest until a stretch is felt on back of thigh. Hold for _20__ seconds. If possible, move towel up to calf and repeat. Repeat on involved leg. MAY ALSO  ASSIST RAISING AND LOWERING YOUR LEG USING BELT OR SHEET Repeat _10__ times. Do _2__ times per day.  Copyright  VHI. All rights reserved.

## 2014-06-25 NOTE — Therapy (Signed)
[place], [place], [address] Phone: [phone]   Fax:  [phone]  Physical Therapy Treatment  Patient Details  Name: Gina Ross MRN: [id number] Date of Birth: [birth year]  Encounter Date: 06/25/2014      PT End of Session - 06/25/14 0923    Visit Number 3   Number of Visits 4   Date for PT Re-Evaluation 07/27/14   PT Start Time 0900   PT Stop Time 0955   PT Time Calculation (min) 55 min      Past Medical History  Diagnosis Date  . Asthma     as a child    Past Surgical History  Procedure Laterality Date  . Colonoscopy    . Orif patella Right 03/22/2014    Procedure: Open Reduction Internal Fixation Right Patella ;  Surgeon: Newt Minion, MD;  Location: [place];  Service: Orthopedics;  Laterality: Right;    There were no vitals taken for this visit.  Visit Diagnosis:  Knee pain, acute, right  Knee stiffness, right      Subjective Assessment - 06/25/14 0903    Symptoms 7.5/10 resting pain to 10/10 pain right knee with stand and walk. Pt reports she received an injection 8 days ago and reports no relief, if not worse.   Limitations Walking;Standing   Patient Stated Goals improve movement, ROM and mobility   Currently in Pain? Yes   Pain Descriptors / Indicators Aching;Burning  intermittent sharp pains   Pain Onset More than a month ago   Pain Frequency Constant   Aggravating Factors  standing prolonged   Pain Relieving Factors lay on side and place pillow between knees          [facility] PT Assessment - 06/25/14 0956    AROM   Right Knee Extension -20   Right Knee Flexion 88  94 active assist   Strength   Right Hip Flexion 3-/5   Right Knee Extension 3-/5                  [facility] Adult PT Treatment/Exercise - 06/25/14 0914    Knee/Hip Exercises: Stretches   Quad Stretch 3 reps;30 seconds  AAROM with strap   Knee/Hip Exercises: Aerobic   Stationary Bike Rec bike  partial revolutions x 5 min for ROM   Knee/Hip Exercises: Seated   Long Arc Quad Right;2 sets;10 reps   Other Seated Knee Exercises Scoot to edge of seat flexion stretch 3 x 30   Other Seated Knee Exercises seated march (decreased AROM) x 10 5 sec holds   Knee/Hip Exercises: Supine   Short Arc Quad Sets AAROM;Strengthening;Right;2 sets;10 reps  PTA assisting to full ROM   Heel Slides AROM;AAROM;Right;20 reps  with and without strap    Terminal Knee Extension Strengthening;Right;2 sets;10 reps  with heel prop   Straight Leg Raises Right;10 reps;AROM  lifting from bolster                PT Education - 06/25/14 0953    Education provided Yes   Education Details HEP   Person(s) Educated Patient   Methods Explanation;Handout   Comprehension Verbalized understanding             PT Long Term Goals - 06/25/14 0955    PT LONG TERM GOAL #1   Title independent with HEP (07/24/14)   Time 8   Period Weeks   Status On-going   PT LONG TERM GOAL #2  Title improve active R knee ROM to 0-80 for improved function and mobility (07/24/14)   Time 8   Period Weeks   Status Partially Met   PT LONG TERM GOAL #3   Title demonstrate at least 3 reps straight leg raise without extensor lag for improved strength (07/24/14)   Time 8   Period Weeks   Status On-going   PT LONG TERM GOAL #4   Title report pain < 2/10 with activity (07/24/14)   Time 8   Period Weeks   Status On-going               Plan - 06/25/14 0906    Clinical Impression Statement Injection not helpful, per patiient. Pt reports she was able to shop in [place] for 10-15 minutes before she needed to sit down and was unable to return to shoppin. Pain did not decrease until able to lay down at home. Pt able to make full retro revolutions on Rec bike at end of session. Declined ice.    PT Next Visit Plan progress HEP, ROM/strengthening knee and hip        Problem List There are no active problems to display for  this patient.   Dorene Ar, [place] 06/25/2014, 10:03 AM  [place], [place], [address] Phone: [phone]   Fax:  [phone]

## 2014-07-09 ENCOUNTER — Ambulatory Visit: Payer: Medicaid Other | Attending: Orthopedic Surgery | Admitting: Physical Therapy

## 2014-07-09 ENCOUNTER — Telehealth: Payer: Self-pay | Admitting: *Deleted

## 2014-07-09 DIAGNOSIS — M25561 Pain in right knee: Secondary | ICD-10-CM | POA: Diagnosis not present

## 2014-07-09 DIAGNOSIS — M25661 Stiffness of right knee, not elsewhere classified: Secondary | ICD-10-CM

## 2014-07-09 NOTE — Therapy (Addendum)
[place], [place], [address] Phone: [phone]   Fax:  [phone]  Physical Therapy Treatment  Patient Details  Name: Gina Ross MRN: [id number] Date of Birth: [birth year] Referring Provider:  Frederico Hamman, MD  Encounter Date: 07/09/2014      PT End of Session - 07/09/14 0929    Visit Number 4   Number of Visits 4   Date for PT Re-Evaluation 07/27/14   PT Start Time 0850   PT Stop Time 0928   PT Time Calculation (min) 38 min   Activity Tolerance Patient tolerated treatment well;Patient limited by pain   Behavior During Therapy [facility] for tasks assessed/performed      Past Medical History  Diagnosis Date  . Asthma     as a child    Past Surgical History  Procedure Laterality Date  . Colonoscopy    . Orif patella Right 03/22/2014    Procedure: Open Reduction Internal Fixation Right Patella ;  Surgeon: Newt Minion, MD;  Location: [place];  Service: Orthopedics;  Laterality: Right;    There were no vitals taken for this visit.  Visit Diagnosis:  Knee pain, acute, right  Knee stiffness, right      Subjective Assessment - 07/09/14 0852    Symptoms Cold weather has made knee feel more stiff   Limitations Walking;Standing   How long can you stand comfortably? 5-7 min   How long can you walk comfortably? 10 min   Patient Stated Goals improve movement, ROM and mobility   Currently in Pain? Yes   Pain Score 8    Pain Location Knee   Pain Orientation Right   Pain Descriptors / Indicators Burning;Aching   Pain Type Surgical pain   Pain Onset More than a month ago   Pain Frequency Constant   Aggravating Factors  prolonged standing/walking   Pain Relieving Factors lying down/resting          [facility] PT Assessment - 07/09/14 0908    AROM   Right Knee Extension 0   Right Knee Flexion 103                  [facility] Adult PT Treatment/Exercise - 07/09/14 0854    Knee/Hip  Exercises: Aerobic   Stationary Bike Rec bike partial revolutions x 8 min for ROM   Knee/Hip Exercises: Standing   SLS LLE tap to targets; on level and compliant surface 5x10 sec RLE   Other Standing Knee Exercises marching, sidestepping with march and tandem walking with intermittent UE support x 4 reps each   Knee/Hip Exercises: Seated   Long Arc Quad Right;2 sets;10 reps   Other Seated Knee Exercises seated marching 2x10 reps                     PT Long Term Goals - 07/09/14 1008    PT LONG TERM GOAL #1   Title independent with HEP (07/24/14)   Status Achieved   PT LONG TERM GOAL #2   Title improve active R knee ROM to 0-80 for improved function and mobility (07/24/14)   Status Achieved   PT LONG TERM GOAL #3   Title demonstrate at least 3 reps straight leg raise without extensor lag for improved strength (07/24/14)   Time 8   Period Weeks   Status On-going   PT LONG TERM GOAL #4   Title report pain < 2/10 with activity (07/24/14)   Baseline  5/10   Time 8   Period Weeks   Status On-going               Plan - 07/09/14 9935    Clinical Impression Statement Improved ROM today and able to progress to closed chain exercises.  Will continue to benefit from PT to improve strength and ROM.   PT Next Visit Plan needs renewal, strengthening/ROM   Consulted and Agree with Plan of Care Patient        Problem List There are no active problems to display for this patient.  Laureen Abrahams, PT, DPT 07/09/2014 10:09 AM [place] [facility] [address] [place], [place], [address] Phone: [phone]   Fax:  [phone]     PHYSICAL THERAPY DISCHARGE SUMMARY  Visits from Start of Care: 4  Current functional level related to goals / functional outcomes: See above; pt canceled remaining appts due to needing surgery   Remaining deficits: unknown   Education / Equipment: HEP  Plan: Patient agrees to  discharge.  Patient goals were not met. Patient is being discharged due to the physician's request.  ?????    Laureen Abrahams, PT, DPT 05/12/2015 11:51 AM  [facility] [address] [place] [address]  [phone] (office) [phone] (fax)

## 2014-07-09 NOTE — Telephone Encounter (Signed)
appts made and printed...td 

## 2014-07-23 ENCOUNTER — Ambulatory Visit: Payer: Medicaid Other

## 2014-07-23 ENCOUNTER — Telehealth: Payer: Self-pay | Admitting: Physical Therapy

## 2014-07-23 NOTE — Telephone Encounter (Signed)
Pt called to speak with PT.  Pt states at MD visit yesterday, MD noticed pin from patella loose and "popping out."  Pt reports she needs to have surgery again to fix the pin.  Pt requested to hold PT until after surgery.  Recommended new referral from MD when pt ready to return.  Will hold PT until appropriate to return.

## 2014-08-23 ENCOUNTER — Other Ambulatory Visit (HOSPITAL_COMMUNITY): Payer: Self-pay | Admitting: Orthopedic Surgery

## 2014-09-03 ENCOUNTER — Encounter (HOSPITAL_COMMUNITY)
Admission: RE | Admit: 2014-09-03 | Discharge: 2014-09-03 | Disposition: A | Discharge status: Home / Self Care | Payer: Medicaid Other | Source: Ambulatory Visit | Attending: Orthopedic Surgery | Admitting: Orthopedic Surgery

## 2014-09-03 ENCOUNTER — Encounter (HOSPITAL_COMMUNITY): Payer: Self-pay

## 2014-09-03 ENCOUNTER — Other Ambulatory Visit (HOSPITAL_COMMUNITY): Payer: Self-pay | Admitting: *Deleted

## 2014-09-03 DIAGNOSIS — Z01812 Encounter for preprocedural laboratory examination: Secondary | ICD-10-CM | POA: Insufficient documentation

## 2014-09-03 DIAGNOSIS — T8484XA Pain due to internal orthopedic prosthetic devices, implants and grafts, initial encounter: Secondary | ICD-10-CM | POA: Insufficient documentation

## 2014-09-03 DIAGNOSIS — Y838 Other surgical procedures as the cause of abnormal reaction of the patient, or of later complication, without mention of misadventure at the time of the procedure: Secondary | ICD-10-CM | POA: Insufficient documentation

## 2014-09-03 LAB — CBC
HEMATOCRIT: 40.5 % (ref 36.0–46.0)
HEMOGLOBIN: 13.9 g/dL (ref 12.0–15.0)
MCH: 31.7 pg (ref 26.0–34.0)
MCHC: 34.3 g/dL (ref 30.0–36.0)
MCV: 92.5 fL (ref 78.0–100.0)
Platelets: 240 10*3/uL (ref 150–400)
RBC: 4.38 MIL/uL (ref 3.87–5.11)
RDW: 12.9 % (ref 11.5–15.5)
WBC: 4.7 10*3/uL (ref 4.0–10.5)

## 2014-09-03 LAB — COMPREHENSIVE METABOLIC PANEL
ALBUMIN: 4.1 g/dL (ref 3.5–5.2)
ALT: 15 U/L (ref 0–35)
ANION GAP: 7 (ref 5–15)
AST: 19 U/L (ref 0–37)
Alkaline Phosphatase: 61 U/L (ref 39–117)
BILIRUBIN TOTAL: 0.4 mg/dL (ref 0.3–1.2)
BUN: 9 mg/dL (ref 6–23)
CHLORIDE: 106 mmol/L (ref 96–112)
CO2: 25 mmol/L (ref 19–32)
CREATININE: 0.78 mg/dL (ref 0.50–1.10)
Calcium: 9 mg/dL (ref 8.4–10.5)
GFR calc Af Amer: >90 mL/min (ref >90)
GLUCOSE: 99 mg/dL (ref 70–99)
POTASSIUM: 4 mmol/L (ref 3.5–5.1)
Sodium: 138 mmol/L (ref 135–145)
Total Protein: 7.5 g/dL (ref 6.0–8.3)

## 2014-09-03 LAB — HCG, SERUM, QUALITATIVE: Preg, Serum: NEGATIVE

## 2014-09-03 LAB — PROTIME-INR
INR: 0.96 (ref 0.00–1.49)
PROTHROMBIN TIME: 12.9 s (ref 11.6–15.2)

## 2014-09-03 LAB — APTT: aPTT: 30 seconds (ref 24–37)

## 2014-09-03 NOTE — Pre-Procedure Instructions (Addendum)
Gina Ross  09/03/2014   Your procedure is scheduled on:  Wednesday, September 11, 2014 at 9:55 AM.   Report to [facility] Entrance "A" Admitting Office at 8:00 AM.   Call this number if you have problems the morning of surgery: [phone]               Any questions prior to day of surgery, please call [phone] between 8 & 4 PM.   Remember:   Do not eat food or drink liquids after midnight Tuesday, 09/10/14.   Take these medicines the morning of surgery with A SIP OF WATER: oxycodone (STOP IBUPROFEN,ADVIL, MOTRIN)   Do not wear jewelry, make-up or nail polish.  Do not wear lotions, powders, or perfumes. You may wear deodorant.  Do not shave 48 hours prior to surgery.   Do not bring valuables to the hospital.  [facility] is not responsible                  for any belongings or valuables.               Contacts, dentures or bridgework may not be worn into surgery.  Leave suitcase in the car. After surgery it may be brought to your room.  For patients admitted to the hospital, discharge time is determined by your                treatment team.               Patients discharged the day of surgery will not be allowed to drive home.    Special Instructions: See "Preparing for Surgery" Instruction sheet   Please read over the following fact sheets that you were given: Pain Booklet, Coughing and Deep Breathing and Surgical Site Infection Prevention

## 2014-09-10 MED ORDER — CEFAZOLIN SODIUM-DEXTROSE 2-3 GM-% IV SOLR
2.0000 g | INTRAVENOUS | Status: AC
  Administered 2014-09-11: 2 g via INTRAVENOUS
  Filled 2014-09-10: qty 50

## 2014-09-10 NOTE — Anesthesia Preprocedure Evaluation (Addendum)
Anesthesia Evaluation  Patient identified by MRN, date of birth, ID band Patient awake    Airway Mallampati: II   Neck ROM: Full    Dental  (+) Teeth Intact, Dental Advisory Given   Pulmonary asthma , Current Smoker,  breath sounds clear to auscultation        Cardiovascular negative cardio ROS  Rhythm:Regular     Neuro/Psych    GI/Hepatic negative GI ROS, Neg liver ROS,   Endo/Other  negative endocrine ROS  Renal/GU negative Renal ROS     Musculoskeletal   Abdominal (+)  Abdomen: soft.    Peds  Hematology 14/40   Anesthesia Other Findings   Reproductive/Obstetrics                            Anesthesia Physical Anesthesia Plan  ASA: II  Anesthesia Plan: General   Post-op Pain Management: MAC Combined w/ Regional for Post-op pain   Induction: Intravenous  Airway Management Planned: Oral ETT and LMA  Additional Equipment:   Intra-op Plan:   Post-operative Plan: Extubation in OR  Informed Consent: I have reviewed the patients History and Physical, chart, labs and discussed the procedure including the risks, benefits and alternatives for the proposed anesthesia with the patient or authorized representative who has indicated his/her understanding and acceptance.     Plan Discussed with:   Anesthesia Plan Comments: (Multimodal pain RX, patient desires block to help wijh post-op pain)       Anesthesia Quick Evaluation

## 2014-09-11 ENCOUNTER — Ambulatory Visit (HOSPITAL_COMMUNITY): Payer: Medicaid Other | Admitting: Anesthesiology

## 2014-09-11 ENCOUNTER — Encounter (HOSPITAL_COMMUNITY): Payer: Self-pay | Admitting: *Deleted

## 2014-09-11 ENCOUNTER — Ambulatory Visit (HOSPITAL_COMMUNITY)
Admission: RE | Admit: 2014-09-11 | Discharge: 2014-09-11 | Disposition: A | Discharge status: Home / Self Care | Payer: Medicaid Other | Source: Ambulatory Visit | Attending: Orthopedic Surgery | Admitting: Orthopedic Surgery

## 2014-09-11 ENCOUNTER — Encounter (HOSPITAL_COMMUNITY)
Admission: RE | Disposition: A | Discharge status: Home / Self Care | Payer: Self-pay | Source: Ambulatory Visit | Attending: Orthopedic Surgery

## 2014-09-11 DIAGNOSIS — Z8781 Personal history of (healed) traumatic fracture: Secondary | ICD-10-CM | POA: Insufficient documentation

## 2014-09-11 DIAGNOSIS — J45909 Unspecified asthma, uncomplicated: Secondary | ICD-10-CM | POA: Insufficient documentation

## 2014-09-11 DIAGNOSIS — F17210 Nicotine dependence, cigarettes, uncomplicated: Secondary | ICD-10-CM | POA: Insufficient documentation

## 2014-09-11 DIAGNOSIS — Y838 Other surgical procedures as the cause of abnormal reaction of the patient, or of later complication, without mention of misadventure at the time of the procedure: Secondary | ICD-10-CM | POA: Diagnosis not present

## 2014-09-11 DIAGNOSIS — T8484XA Pain due to internal orthopedic prosthetic devices, implants and grafts, initial encounter: Secondary | ICD-10-CM | POA: Diagnosis present

## 2014-09-11 HISTORY — PX: HARDWARE REMOVAL: SHX979

## 2014-09-11 SURGERY — REMOVAL, HARDWARE
Anesthesia: General | Site: Knee | Laterality: Right

## 2014-09-11 MED ORDER — FENTANYL CITRATE 0.05 MG/ML IJ SOLN
INTRAMUSCULAR | Status: DC | PRN
  Administered 2014-09-11 (×2): 50 ug via INTRAVENOUS

## 2014-09-11 MED ORDER — PROPOFOL 10 MG/ML IV BOLUS
INTRAVENOUS | Status: DC | PRN
  Administered 2014-09-11: 150 mg via INTRAVENOUS

## 2014-09-11 MED ORDER — MIDAZOLAM HCL 5 MG/ML IJ SOLN: 1.0000 mg | Freq: Once | INTRAMUSCULAR | Status: DC

## 2014-09-11 MED ORDER — FENTANYL CITRATE 0.05 MG/ML IJ SOLN
INTRAMUSCULAR | Status: AC
  Filled 2014-09-11: qty 5

## 2014-09-11 MED ORDER — MEPERIDINE HCL 25 MG/ML IJ SOLN: 6.2500 mg | INTRAMUSCULAR | Status: DC | PRN

## 2014-09-11 MED ORDER — 0.9 % SODIUM CHLORIDE (POUR BTL) OPTIME
TOPICAL | Status: DC | PRN
  Administered 2014-09-11: 1000 mL

## 2014-09-11 MED ORDER — LACTATED RINGERS IV SOLN
INTRAVENOUS | Status: DC
  Administered 2014-09-11: 09:00:00 via INTRAVENOUS

## 2014-09-11 MED ORDER — LIDOCAINE HCL (CARDIAC) 20 MG/ML IV SOLN
INTRAVENOUS | Status: DC | PRN
  Administered 2014-09-11: 100 mg via INTRAVENOUS

## 2014-09-11 MED ORDER — MIDAZOLAM HCL 2 MG/2ML IJ SOLN
INTRAMUSCULAR | Status: AC
  Administered 2014-09-11: 1 mg
  Filled 2014-09-11: qty 2

## 2014-09-11 MED ORDER — FENTANYL CITRATE 0.05 MG/ML IJ SOLN
75.0000 ug | Freq: Once | INTRAMUSCULAR | Status: AC
  Administered 2014-09-11: 75 ug via INTRAVENOUS

## 2014-09-11 MED ORDER — LIDOCAINE HCL (CARDIAC) 20 MG/ML IV SOLN
INTRAVENOUS | Status: AC
  Filled 2014-09-11: qty 5

## 2014-09-11 MED ORDER — PROMETHAZINE HCL 25 MG/ML IJ SOLN: 6.2500 mg | INTRAMUSCULAR | Status: DC | PRN

## 2014-09-11 MED ORDER — FENTANYL CITRATE 0.05 MG/ML IJ SOLN
INTRAMUSCULAR | Status: AC
  Filled 2014-09-11: qty 2

## 2014-09-11 MED ORDER — LACTATED RINGERS IV SOLN
INTRAVENOUS | Status: DC | PRN
  Administered 2014-09-11: 09:00:00 via INTRAVENOUS

## 2014-09-11 MED ORDER — BUPIVACAINE-EPINEPHRINE (PF) 0.5% -1:200000 IJ SOLN
INTRAMUSCULAR | Status: DC | PRN
  Administered 2014-09-11: 30 mL via PERINEURAL

## 2014-09-11 MED ORDER — OXYCODONE-ACETAMINOPHEN 7.5-325 MG PO TABS
1.0000 | ORAL_TABLET | ORAL | Status: DC | PRN
Start: 2014-09-11 — End: 2016-04-12

## 2014-09-11 MED ORDER — MIDAZOLAM HCL 2 MG/2ML IJ SOLN
INTRAMUSCULAR | Status: AC
  Filled 2014-09-11: qty 2

## 2014-09-11 MED ORDER — PROPOFOL 10 MG/ML IV BOLUS
INTRAVENOUS | Status: AC
  Filled 2014-09-11: qty 20

## 2014-09-11 MED ORDER — FENTANYL CITRATE 0.05 MG/ML IJ SOLN
25.0000 ug | INTRAMUSCULAR | Status: DC | PRN
  Administered 2014-09-11 (×3): 50 ug via INTRAVENOUS

## 2014-09-11 SURGICAL SUPPLY — 61 items
BANDAGE ELASTIC 4 VELCRO ST LF (GAUZE/BANDAGES/DRESSINGS) ×1 IMPLANT
BANDAGE ELASTIC 6 VELCRO ST LF (GAUZE/BANDAGES/DRESSINGS) ×1 IMPLANT
BANDAGE ESMARK 6X9 LF (GAUZE/BANDAGES/DRESSINGS) IMPLANT
BANDAGE GAUZE 4  KLING STR (GAUZE/BANDAGES/DRESSINGS) ×2 IMPLANT
BNDG CMPR 9X6 STRL LF SNTH (GAUZE/BANDAGES/DRESSINGS)
BNDG COHESIVE 4X5 TAN STRL (GAUZE/BANDAGES/DRESSINGS) IMPLANT
BNDG COHESIVE 6X5 TAN STRL LF (GAUZE/BANDAGES/DRESSINGS) ×4 IMPLANT
BNDG ESMARK 6X9 LF (GAUZE/BANDAGES/DRESSINGS)
BNDG GAUZE ELAST 4 BULKY (GAUZE/BANDAGES/DRESSINGS) ×2 IMPLANT
CLSR STERI-STRIP ANTIMIC 1/2X4 (GAUZE/BANDAGES/DRESSINGS) ×1 IMPLANT
COVER SURGICAL LIGHT HANDLE (MISCELLANEOUS) ×2 IMPLANT
CUFF TOURNIQUET SINGLE 34IN LL (TOURNIQUET CUFF) IMPLANT
CUFF TOURNIQUET SINGLE 44IN (TOURNIQUET CUFF) IMPLANT
DRAPE C-ARM 42X72 X-RAY (DRAPES) IMPLANT
DRAPE INCISE IOBAN 66X45 STRL (DRAPES) IMPLANT
DRAPE OEC MINIVIEW 54X84 (DRAPES) ×1 IMPLANT
DRAPE ORTHO SPLIT 77X108 STRL (DRAPES)
DRAPE SURG ORHT 6 SPLT 77X108 (DRAPES) IMPLANT
DRAPE U-SHAPE 47X51 STRL (DRAPES) ×1 IMPLANT
DRSG EMULSION OIL 3X3 NADH (GAUZE/BANDAGES/DRESSINGS) ×2 IMPLANT
DRSG PAD ABDOMINAL 8X10 ST (GAUZE/BANDAGES/DRESSINGS) ×3 IMPLANT
ELECT REM PT RETURN 9FT ADLT (ELECTROSURGICAL) ×2
ELECTRODE REM PT RTRN 9FT ADLT (ELECTROSURGICAL) ×1 IMPLANT
GAUZE SPONGE 4X4 12PLY STRL (GAUZE/BANDAGES/DRESSINGS) ×2 IMPLANT
GLOVE BIOGEL PI IND STRL 7.0 (GLOVE) ×1 IMPLANT
GLOVE BIOGEL PI IND STRL 7.5 (GLOVE) ×1 IMPLANT
GLOVE BIOGEL PI IND STRL 9 (GLOVE) ×1 IMPLANT
GLOVE BIOGEL PI INDICATOR 7.0 (GLOVE) ×1
GLOVE BIOGEL PI INDICATOR 7.5 (GLOVE) ×1
GLOVE BIOGEL PI INDICATOR 9 (GLOVE) ×1
GLOVE SURG ORTHO 9.0 STRL STRW (GLOVE) ×2 IMPLANT
GLOVE SURG SS PI 6.0 STRL IVOR (GLOVE) ×1 IMPLANT
GLOVE SURG SS PI 6.5 STRL IVOR (GLOVE) ×2 IMPLANT
GLOVE SURG SS PI 7.0 STRL IVOR (GLOVE) ×1 IMPLANT
GOWN STRL REUS W/ TWL LRG LVL3 (GOWN DISPOSABLE) IMPLANT
GOWN STRL REUS W/ TWL XL LVL3 (GOWN DISPOSABLE) ×3 IMPLANT
GOWN STRL REUS W/TWL LRG LVL3 (GOWN DISPOSABLE) ×6
GOWN STRL REUS W/TWL XL LVL3 (GOWN DISPOSABLE) ×2
KIT BASIN OR (CUSTOM PROCEDURE TRAY) ×2 IMPLANT
KIT ROOM TURNOVER OR (KITS) ×2 IMPLANT
MANIFOLD NEPTUNE II (INSTRUMENTS) IMPLANT
NS IRRIG 1000ML POUR BTL (IV SOLUTION) ×2 IMPLANT
PACK ORTHO EXTREMITY (CUSTOM PROCEDURE TRAY) ×2 IMPLANT
PAD ARMBOARD 7.5X6 YLW CONV (MISCELLANEOUS) ×4 IMPLANT
PAD CAST 4YDX4 CTTN HI CHSV (CAST SUPPLIES) ×1 IMPLANT
PADDING CAST COTTON 4X4 STRL (CAST SUPPLIES)
SPONGE GAUZE 4X4 12PLY STER LF (GAUZE/BANDAGES/DRESSINGS) ×2 IMPLANT
SPONGE LAP 18X18 X RAY DECT (DISPOSABLE) IMPLANT
STAPLER VISISTAT 35W (STAPLE) IMPLANT
STOCKINETTE IMPERVIOUS 9X36 MD (GAUZE/BANDAGES/DRESSINGS) ×1 IMPLANT
STOCKINETTE IMPERVIOUS LG (DRAPES) ×2 IMPLANT
SUT ETHILON 2 0 PSLX (SUTURE) IMPLANT
SUT MNCRL AB 3-0 PS2 18 (SUTURE) ×2 IMPLANT
SUT MON AB 2-0 CT1 36 (SUTURE) ×1 IMPLANT
SUT VIC AB 0 CT1 27 (SUTURE)
SUT VIC AB 0 CT1 27XBRD ANBCTR (SUTURE) IMPLANT
SUT VIC AB 2-0 CT1 27 (SUTURE) ×2
SUT VIC AB 2-0 CT1 TAPERPNT 27 (SUTURE) IMPLANT
TOWEL OR 17X24 6PK STRL BLUE (TOWEL DISPOSABLE) ×2 IMPLANT
TOWEL OR 17X26 10 PK STRL BLUE (TOWEL DISPOSABLE) ×2 IMPLANT
WATER STERILE IRR 1000ML POUR (IV SOLUTION) ×1 IMPLANT

## 2014-09-11 NOTE — Discharge Instructions (Signed)
°What to eat: ° °For your first meals, you should eat lightly; only small meals initially.  If you do not have nausea, you may eat larger meals.  Avoid spicy, greasy and heavy food.   ° °General Anesthesia, Adult, Care After  °Refer to this sheet in the next few weeks. These instructions provide you with information on caring for yourself after your procedure. Your health care provider may also give you more specific instructions. Your treatment has been planned according to current medical practices, but problems sometimes occur. Call your health care provider if you have any problems or questions after your procedure.  °WHAT TO EXPECT AFTER THE PROCEDURE  °After the procedure, it is typical to experience:  °Sleepiness.  °Nausea and vomiting. °HOME CARE INSTRUCTIONS  °For the first 24 hours after general anesthesia:  °Have a responsible person with you.  °Do not drive a car. If you are alone, do not take public transportation.  °Do not drink alcohol.  °Do not take medicine that has not been prescribed by your health care provider.  °Do not sign important papers or make important decisions.  °You may resume a normal diet and activities as directed by your health care provider.  °Change bandages (dressings) as directed.  °If you have questions or problems that seem related to general anesthesia, call the hospital and ask for the anesthetist or anesthesiologist on call. °SEEK MEDICAL CARE IF:  °You have nausea and vomiting that continue the day after anesthesia.  °You develop a rash. °SEEK IMMEDIATE MEDICAL CARE IF:  °You have difficulty breathing.  °You have chest pain.  °You have any allergic problems. °Document Released: 09/20/2000 Document Revised: 02/14/2013 Document Reviewed: 12/28/2012  °ExitCare® Patient Information ©2014 [employer].  ° °Sore Throat  ° ° °A sore throat is a painful, burning, sore, or scratchy feeling of the throat. There may be pain or tenderness when swallowing or talking. You may have  other symptoms with a sore throat. These include coughing, sneezing, fever, or a swollen neck. A sore throat is often the first sign of another sickness. These sicknesses may include a cold, flu, strep throat, or an infection called mono. Most sore throats go away without medical treatment.  °HOME CARE  °Only take medicine as told by your doctor.  °Drink enough fluids to keep your pee (urine) clear or pale yellow.  °Rest as needed.  °Try using throat sprays, lozenges, or suck on hard candy (if older than 4 years or as told).  °Sip warm liquids, such as broth, herbal tea, or warm water with honey. Try sucking on frozen ice pops or drinking cold liquids.  °Rinse the mouth (gargle) with salt water. Mix 1 teaspoon salt with 8 ounces of water.  °Do not smoke. Avoid being around others when they are smoking.  °Put a humidifier in your bedroom at night to moisten the air. You can also turn on a hot shower and sit in the bathroom for 5-10 minutes. Be sure the bathroom door is closed. °GET HELP RIGHT AWAY IF:  °You have trouble breathing.  °You cannot swallow fluids, soft foods, or your spit (saliva).  °You have more puffiness (swelling) in the throat.  °Your sore throat does not get better in 7 days.  °You feel sick to your stomach (nauseous) and throw up (vomit).  °You have a fever or lasting symptoms for more than 2-3 days.  °You have a fever and your symptoms suddenly get worse. °MAKE SURE YOU:  °Understand these   instructions.  °Will watch your condition.  °Will get help right away if you are not doing well or get worse. °Document Released: 03/23/2008 Document Revised: 03/08/2012 Document Reviewed: 02/20/2012  °ExitCare® Patient Information ©2015 [employer]. This information is not intended to replace advice given to you by your health care provider. Make sure you discuss any questions you have with your health care provider.  ° ° ° °

## 2014-09-11 NOTE — Anesthesia Procedure Notes (Addendum)
Anesthesia Regional Block:  Adductor canal block  Pre-Anesthetic Checklist: ,, timeout performed, Correct Patient, Correct Site, Correct Laterality, Correct Procedure, Correct Position, site marked, Risks and benefits discussed,  Surgical consent,  Pre-op evaluation,  At surgeon's request and post-op pain management  Laterality: Lower and Right  Prep: Maximum Sterile Barrier Precautions used       Needles:  Injection technique: Single-shot  Needle Type: Echogenic Stimulator Needle     Needle Length: 10cm 10 cm Needle Gauge: 21 and 21 G    Additional Needles:  Procedures: ultrasound guided (picture in chart) Adductor canal block Narrative:  Start time: 09/11/2014 9:20 AM End time: 09/11/2014 9:30 AM Injection made incrementally with aspirations every 5 mL.  Performed by: Personally  Anesthesiologist: Alexis Frock  Additional Notes: R AC Block with [place] and .5% marcaine with epi 26ml.  Talked to patient throughout procedure, multiple ASP, no complications   Procedure Name: LMA Insertion Date/Time: 09/11/2014 9:48 AM Performed by: Eligha Bridegroom Pre-anesthesia Checklist: Timeout performed, Patient identified, Emergency Drugs available, Suction available and Patient being monitored Patient Re-evaluated:Patient Re-evaluated prior to inductionOxygen Delivery Method: Circle system utilized Preoxygenation: Pre-oxygenation with 100% oxygen Intubation Type: IV induction LMA: LMA inserted LMA Size: 4.0 Number of attempts: 1 Tube secured with: Tape Dental Injury: Teeth and Oropharynx as per pre-operative assessment

## 2014-09-11 NOTE — Op Note (Signed)
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 Utilization of C-arm fluoroscopy to remove the hardware.  SURGEON:  Newt Minion, MD  PHYSICIAN ASSISTANT:None ANESTHESIA:   General  PREOPERATIVE INDICATIONS:  Gina Ross is a  44 y.o. female with a diagnosis of Painful Deep Retained Hardware Right Knee who failed conservative measures and elected for surgical management.    The risks benefits and alternatives were discussed with the patient preoperatively including but not limited to the risks of infection, bleeding, nerve injury, cardiopulmonary complications, the need for revision surgery, among others, and the patient was willing to proceed.  OPERATIVE IMPLANTS: none  OPERATIVE FINDINGS: keloid scar  OPERATIVE PROCEDURE: Patient was brought to the operating room and underwent a general anesthetic after a local block. After adequate levels of anesthesia were obtained patient's right lower extremity was prepped using DuraPrep draped into a sterile field. A timeout was called. Patient's keloid scar was excised in 1 block of tissue. The deep retained hardware was removed without complications 2 K wires and one figure-of-eight tension band were removed. C-arm fluoroscopy was used to facilitate excision of the hardware. The wound was irrigated with normal saline and a plastics closure was used to close the incision after the removal of the keloid scar. A sterile compressive dressing was applied. Patient was extubated taken to the PACU in stable condition.

## 2014-09-11 NOTE — H&P (Signed)
Gina Ross is an 44 y.o. female.   Chief Complaint: Painful deep retained hardware right knee HPI: Patient is status post open reduction internal fixation patella fracture. She presents at this time for removal of prominent deep retained hardware.  Past Medical History  Diagnosis Date  . Asthma     as a child    Past Surgical History  Procedure Laterality Date  . Colonoscopy    . Orif patella Right 03/22/2014    Procedure: Open Reduction Internal Fixation Right Patella ;  Surgeon: Newt Minion, MD;  Location: [facility];  Service: Orthopedics;  Laterality: Right;    Family History  Problem Relation Age of Onset  . Hypertension Mother   . Diabetes Mother   . Hypertension Father    Social History:  reports that she has been smoking Cigarettes.  She has been smoking about 0.10 packs per day. She does not have any smokeless tobacco history on file. She reports that she drinks alcohol. She reports that she does not use illicit drugs.  Allergies: No Known Allergies  No prescriptions prior to admission    No results found for this or any previous visit (from the past 48 hour(s)). No results found.  Review of Systems  All other systems reviewed and are negative.   There were no vitals taken for this visit. Physical Exam  On examination patient has palpable deep retained hardware. There is no skin ulceration. Assessment/Plan Assessment: Painful deep retained hardware status post open reduction internal fixation right patella.  Plan: We'll plan for removal deep retained hardware.  DUDA,MARCUS V 09/11/2014, 6:34 AM

## 2014-09-11 NOTE — Anesthesia Postprocedure Evaluation (Signed)
  Anesthesia Post-op Note  Patient: Gina Ross  Procedure(s) Performed: Procedure(s): Removal Deep Hardware Right Knee (Right)  Patient Location: PACU  Anesthesia Type:General  Level of Consciousness: awake, alert  and sedated  Airway and Oxygen Therapy: Patient Spontanous Breathing and Patient connected to nasal cannula oxygen  Post-op Pain: mild  Post-op Assessment: Post-op Vital signs reviewed, Patient's Cardiovascular Status Stable, Respiratory Function Stable, Patent Airway and No signs of Nausea or vomiting  Post-op Vital Signs: Reviewed and stable  Last Vitals:  Filed Vitals:   09/11/14 0926  BP:   Pulse: 85  Temp:   Resp: 14    Complications: No apparent anesthesia complications

## 2014-09-11 NOTE — Transfer of Care (Signed)
Immediate Anesthesia Transfer of Care Note  Patient: Gina Ross  Procedure(s) Performed: Procedure(s): Removal Deep Hardware Right Knee (Right)  Patient Location: PACU  Anesthesia Type:General and GA combined with regional for post-op pain  Level of Consciousness: awake, alert  and oriented  Airway & Oxygen Therapy: Patient Spontanous Breathing and Patient connected to nasal cannula oxygen  Post-op Assessment: Report given to RN and Post -op Vital signs reviewed and stable  Post vital signs: Reviewed and stable  Last Vitals:  Filed Vitals:   09/11/14 0926  BP:   Pulse: 85  Temp:   Resp: 14    Complications: No apparent anesthesia complications

## 2014-09-12 ENCOUNTER — Encounter (HOSPITAL_COMMUNITY): Payer: Self-pay | Admitting: Orthopedic Surgery

## 2014-10-15 ENCOUNTER — Ambulatory Visit: Payer: Medicaid Other | Attending: Orthopedic Surgery

## 2014-10-15 DIAGNOSIS — M25561 Pain in right knee: Secondary | ICD-10-CM | POA: Insufficient documentation

## 2014-10-15 DIAGNOSIS — M25661 Stiffness of right knee, not elsewhere classified: Secondary | ICD-10-CM | POA: Insufficient documentation

## 2014-10-28 ENCOUNTER — Ambulatory Visit: Payer: Medicaid Other | Attending: Orthopedic Surgery

## 2014-10-28 DIAGNOSIS — M25661 Stiffness of right knee, not elsewhere classified: Secondary | ICD-10-CM | POA: Insufficient documentation

## 2014-10-28 DIAGNOSIS — M25561 Pain in right knee: Secondary | ICD-10-CM | POA: Insufficient documentation

## 2015-01-22 ENCOUNTER — Emergency Department (HOSPITAL_COMMUNITY)
Admission: EM | Admit: 2015-01-22 | Discharge: 2015-01-22 | Disposition: A | Discharge status: Home / Self Care | Payer: Medicaid Other | Source: Home / Self Care | Attending: Family Medicine | Admitting: Family Medicine

## 2015-01-22 ENCOUNTER — Encounter (HOSPITAL_COMMUNITY): Payer: Self-pay | Admitting: *Deleted

## 2015-01-22 DIAGNOSIS — K047 Periapical abscess without sinus: Secondary | ICD-10-CM | POA: Diagnosis not present

## 2015-01-22 MED ORDER — DICLOFENAC POTASSIUM 50 MG PO TABS: 50.0000 mg | ORAL_TABLET | Freq: Three times a day (TID) | ORAL | Status: DC

## 2015-01-22 MED ORDER — CLINDAMYCIN HCL 300 MG PO CAPS: 300.0000 mg | ORAL_CAPSULE | Freq: Three times a day (TID) | ORAL | Status: DC

## 2015-01-22 NOTE — ED Provider Notes (Signed)
CSN: [id number]     Arrival date & time 01/22/15  1614 History   First MD Initiated Contact with Patient 01/22/15 1647     Chief Complaint  Patient presents with  . Facial Swelling   (Consider location/radiation/quality/duration/timing/severity/associated sxs/prior Treatment) Patient is a 44 y.o. female presenting with tooth pain. The history is provided by the patient.  Dental Pain Location:  Upper Upper teeth location:  13/LU 2nd bicuspid Quality:  Throbbing Severity:  Moderate Onset quality:  Sudden Duration:  8 hours Progression:  Worsening Chronicity:  New Context: abscess, dental caries and poor dentition   Relieved by:  None tried Worsened by:  Nothing tried Ineffective treatments:  None tried Associated symptoms: facial swelling and gum swelling   Associated symptoms: no drooling   Risk factors: lack of dental care and smoking     Past Medical History  Diagnosis Date  . Asthma     as a child   Past Surgical History  Procedure Laterality Date  . Colonoscopy    . Orif patella Right 03/22/2014    Procedure: Open Reduction Internal Fixation Right Patella ;  Surgeon: Newt Minion, MD;  Location: [facility];  Service: Orthopedics;  Laterality: Right;  . Hardware removal Right 09/11/2014    Procedure: Removal Deep Hardware Right Knee;  Surgeon: Newt Minion, MD;  Location: [facility];  Service: Orthopedics;  Laterality: Right;   Family History  Problem Relation Age of Onset  . Hypertension Mother   . Diabetes Mother   . Hypertension Father    History  Substance Use Topics  . Smoking status: Current Every Day Smoker -- 0.10 packs/day    Types: Cigarettes  . Smokeless tobacco: Not on file  . Alcohol Use: Yes     Comment: socailly   OB History    No data available     Review of Systems  Constitutional: Negative.   HENT: Positive for dental problem and facial swelling. Negative for drooling.     Allergies  Review of patient's allergies indicates no known  allergies.  Home Medications   Prior to Admission medications   Medication Sig Start Date End Date Taking? Authorizing Provider  clindamycin (CLEOCIN) 300 MG capsule Take 1 capsule (300 mg total) by mouth 3 (three) times daily. 01/22/15   Billy Fischer, MD  diclofenac (CATAFLAM) 50 MG tablet Take 1 tablet (50 mg total) by mouth 3 (three) times daily. For dental pain 01/22/15   Billy Fischer, MD  ibuprofen (ADVIL,MOTRIN) 800 MG tablet Take 1 tablet (800 mg total) by mouth 3 (three) times daily. 03/16/14   Harvie Heck, PA-C  oxyCODONE-acetaminophen (PERCOCET) 7.5-325 MG per tablet Take 1 tablet by mouth every 4 (four) hours as needed for pain. Patient not taking: Reported on 05/28/2014 03/22/14   Newt Minion, MD  oxyCODONE-acetaminophen (PERCOCET) 7.5-325 MG per tablet Take 1 tablet by mouth every 4 (four) hours as needed for pain. 09/11/14   Newt Minion, MD  oxyCODONE-acetaminophen (PERCOCET/ROXICET) 5-325 MG per tablet Take 1-2 tablets by mouth every 4 (four) hours as needed for severe pain. 03/16/14   Lauren Parker, PA-C   BP 144/91 mmHg  Pulse 79  Temp(Src) 97.9 F (36.6 C) (Oral)  Resp 16  SpO2 99%  LMP 01/19/2015 Physical Exam  Constitutional: She appears well-developed and well-nourished. She appears distressed.  HENT:  Head: Normocephalic.  Right Ear: External ear normal.  Left Ear: External ear normal.  Mouth/Throat: Abnormal dentition. Dental abscesses present.  Nursing note and vitals reviewed.   ED Course  Procedures (including critical care time) Labs Review Labs Reviewed - No data to display  Imaging Review No results found.   MDM   1. Dental abscess        Billy Fischer, MD 01/22/15 [phone]

## 2015-01-22 NOTE — ED Notes (Signed)
Pt  Reports  Some  Swelling  To the  l  Side  Of her  Face    -   she  Reported  The  Pain  Started  Out  As  A toothache     And  Has  Become  Worse       She  Is  Sitting  Upright on the  Exam table  In no acute  Severe  Distress

## 2015-01-22 NOTE — Discharge Instructions (Signed)
Take medicine as prescribed, see your dentist as soon as possible °

## 2015-02-10 NOTE — ED Notes (Signed)
Call from patient, c/o her problems is worse, and needs a new Rx. Discussed w Dr Juventino Slovak, MD of record, who advises we cannot teat over phone. Patient  will return to clinic for recheck

## 2015-05-11 ENCOUNTER — Encounter (HOSPITAL_COMMUNITY): Payer: Self-pay | Admitting: *Deleted

## 2015-05-11 ENCOUNTER — Emergency Department (HOSPITAL_COMMUNITY)
Admission: EM | Admit: 2015-05-11 | Discharge: 2015-05-11 | Disposition: A | Discharge status: Home / Self Care | Payer: Medicaid Other | Attending: Emergency Medicine | Admitting: Emergency Medicine

## 2015-05-11 DIAGNOSIS — S199XXA Unspecified injury of neck, initial encounter: Secondary | ICD-10-CM | POA: Diagnosis present

## 2015-05-11 DIAGNOSIS — Y9389 Activity, other specified: Secondary | ICD-10-CM | POA: Diagnosis not present

## 2015-05-11 DIAGNOSIS — S299XXA Unspecified injury of thorax, initial encounter: Secondary | ICD-10-CM | POA: Insufficient documentation

## 2015-05-11 DIAGNOSIS — M542 Cervicalgia: Secondary | ICD-10-CM

## 2015-05-11 DIAGNOSIS — M25561 Pain in right knee: Secondary | ICD-10-CM

## 2015-05-11 DIAGNOSIS — S8991XA Unspecified injury of right lower leg, initial encounter: Secondary | ICD-10-CM | POA: Insufficient documentation

## 2015-05-11 DIAGNOSIS — Z791 Long term (current) use of non-steroidal anti-inflammatories (NSAID): Secondary | ICD-10-CM | POA: Insufficient documentation

## 2015-05-11 DIAGNOSIS — Y998 Other external cause status: Secondary | ICD-10-CM | POA: Diagnosis not present

## 2015-05-11 DIAGNOSIS — R51 Headache: Secondary | ICD-10-CM

## 2015-05-11 DIAGNOSIS — J45909 Unspecified asthma, uncomplicated: Secondary | ICD-10-CM | POA: Diagnosis not present

## 2015-05-11 DIAGNOSIS — M549 Dorsalgia, unspecified: Secondary | ICD-10-CM

## 2015-05-11 DIAGNOSIS — R519 Headache, unspecified: Secondary | ICD-10-CM

## 2015-05-11 DIAGNOSIS — Y92410 Unspecified street and highway as the place of occurrence of the external cause: Secondary | ICD-10-CM | POA: Insufficient documentation

## 2015-05-11 DIAGNOSIS — S3992XA Unspecified injury of lower back, initial encounter: Secondary | ICD-10-CM | POA: Insufficient documentation

## 2015-05-11 DIAGNOSIS — F17210 Nicotine dependence, cigarettes, uncomplicated: Secondary | ICD-10-CM | POA: Insufficient documentation

## 2015-05-11 DIAGNOSIS — S4991XA Unspecified injury of right shoulder and upper arm, initial encounter: Secondary | ICD-10-CM | POA: Diagnosis not present

## 2015-05-11 DIAGNOSIS — Z792 Long term (current) use of antibiotics: Secondary | ICD-10-CM | POA: Diagnosis not present

## 2015-05-11 DIAGNOSIS — S0990XA Unspecified injury of head, initial encounter: Secondary | ICD-10-CM | POA: Insufficient documentation

## 2015-05-11 DIAGNOSIS — S4992XA Unspecified injury of left shoulder and upper arm, initial encounter: Secondary | ICD-10-CM | POA: Insufficient documentation

## 2015-05-11 MED ORDER — HYDROCODONE-ACETAMINOPHEN 5-325 MG PO TABS
2.0000 | ORAL_TABLET | Freq: Once | ORAL | Status: AC
  Administered 2015-05-11: 2 via ORAL
  Filled 2015-05-11: qty 2

## 2015-05-11 MED ORDER — HYDROCODONE-ACETAMINOPHEN 5-325 MG PO TABS: 2.0000 | ORAL_TABLET | ORAL | Status: DC | PRN

## 2015-05-11 MED ORDER — CYCLOBENZAPRINE HCL 10 MG PO TABS
10.0000 mg | ORAL_TABLET | Freq: Once | ORAL | Status: AC
  Administered 2015-05-11: 10 mg via ORAL
  Filled 2015-05-11: qty 1

## 2015-05-11 MED ORDER — IBUPROFEN 800 MG PO TABS: 800.0000 mg | ORAL_TABLET | Freq: Three times a day (TID) | ORAL | Status: DC

## 2015-05-11 MED ORDER — CYCLOBENZAPRINE HCL 10 MG PO TABS: 10.0000 mg | ORAL_TABLET | Freq: Two times a day (BID) | ORAL | Status: DC | PRN

## 2015-05-11 NOTE — ED Notes (Signed)
Bed: WA29 Expected date:  Expected time:  Means of arrival:  Comments: 
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
## 2016-01-04 ENCOUNTER — Encounter (HOSPITAL_COMMUNITY): Payer: Self-pay | Admitting: *Deleted

## 2016-01-04 ENCOUNTER — Ambulatory Visit (HOSPITAL_COMMUNITY)
Admission: EM | Admit: 2016-01-04 | Discharge: 2016-01-04 | Disposition: A | Discharge status: Home / Self Care | Payer: Medicaid Other | Attending: Family Medicine | Admitting: Family Medicine

## 2016-01-04 DIAGNOSIS — S8001XA Contusion of right knee, initial encounter: Secondary | ICD-10-CM | POA: Diagnosis not present

## 2016-01-04 MED ORDER — DICLOFENAC POTASSIUM 50 MG PO TABS: 50.0000 mg | ORAL_TABLET | Freq: Three times a day (TID) | ORAL | Status: DC
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
## 2016-01-04 NOTE — Discharge Instructions (Signed)
Ice, knee support and pain medicine as needed. See your doctor if further problems.

## 2016-01-04 NOTE — ED Notes (Signed)
[place]
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
## 2016-04-12 ENCOUNTER — Encounter (HOSPITAL_COMMUNITY): Payer: Self-pay | Admitting: Emergency Medicine

## 2016-04-12 ENCOUNTER — Emergency Department (HOSPITAL_COMMUNITY): Payer: Self-pay

## 2016-04-12 ENCOUNTER — Emergency Department (HOSPITAL_COMMUNITY)
Admission: EM | Admit: 2016-04-12 | Discharge: 2016-04-12 | Disposition: A | Discharge status: Home / Self Care | Payer: Self-pay | Attending: Emergency Medicine | Admitting: Emergency Medicine

## 2016-04-12 DIAGNOSIS — F17210 Nicotine dependence, cigarettes, uncomplicated: Secondary | ICD-10-CM | POA: Insufficient documentation

## 2016-04-12 DIAGNOSIS — J069 Acute upper respiratory infection, unspecified: Secondary | ICD-10-CM | POA: Insufficient documentation

## 2016-04-12 DIAGNOSIS — J45909 Unspecified asthma, uncomplicated: Secondary | ICD-10-CM | POA: Insufficient documentation

## 2016-04-12 DIAGNOSIS — B9789 Other viral agents as the cause of diseases classified elsewhere: Secondary | ICD-10-CM

## 2016-04-12 LAB — RAPID STREP SCREEN (MED CTR MEBANE ONLY): STREPTOCOCCUS, GROUP A SCREEN (DIRECT): NEGATIVE

## 2016-04-12 MED ORDER — HYDROCODONE-HOMATROPINE 5-1.5 MG/5ML PO SYRP: 5.0000 mL | ORAL_SOLUTION | Freq: Four times a day (QID) | ORAL | 0 refills | Status: DC | PRN

## 2016-04-12 MED ORDER — ALBUTEROL SULFATE HFA 108 (90 BASE) MCG/ACT IN AERS
2.0000 | INHALATION_SPRAY | Freq: Once | RESPIRATORY_TRACT | Status: AC
  Administered 2016-04-12: 2 via RESPIRATORY_TRACT
  Filled 2016-04-12: qty 6.7

## 2016-04-12 MED ORDER — GUAIFENESIN-DM 100-10 MG/5ML PO SYRP: 5.0000 mL | ORAL_SOLUTION | ORAL | 0 refills | Status: DC | PRN

## 2016-04-12 NOTE — ED Notes (Signed)
Declined W/C at D/C and was escorted to lobby by RN. 

## 2016-04-12 NOTE — ED Provider Notes (Signed)
[facility] DEPT Provider Note   CSN: [id number] Arrival date & time: 04/12/16  0806  By signing my name below, I, Emmanuella Mensah, attest that this documentation has been prepared under the direction and in the presence of [facility], PA-C. Electronically Signed: Judithann Sauger, ED Scribe. 04/12/16. 9:27 AM.   History   Chief Complaint Chief Complaint  Patient presents with  . Cough    HPI Comments: Gina Ross is a 45 y.o. female with a hx of childhood asthma who presents to the Emergency Department complaining of gradually worsening persistent moderate non-productive cough onset 6 days ago. She reports associated nasal congestion, mild generalized body aches, and a scratchy throat. She adds that last night, she had a coughing fit to the point where she felt as though she would "cough her lungs out". No alleviating factors noted. She states that she has tried cough drops and Sudafed with no relief. She denies any known sick contacts. She also denies any fever, ear pain, generalized rash, abdominal pain, nausea, vomiting, diarrhea, urinary symptoms, or any other symptoms.   The history is provided by the patient. No language interpreter was used.    Past Medical History:  Diagnosis Date  . Asthma    as a child    There are no active problems to display for this patient.   Past Surgical History:  Procedure Laterality Date  . COLONOSCOPY    . HARDWARE REMOVAL Right 09/11/2014   Procedure: Removal Deep Hardware Right Knee;  Surgeon: Newt Minion, MD;  Location: [place];  Service: Orthopedics;  Laterality: Right;  . ORIF PATELLA Right 03/22/2014   Procedure: Open Reduction Internal Fixation Right Patella ;  Surgeon: Newt Minion, MD;  Location: [place];  Service: Orthopedics;  Laterality: Right;    OB History    No data available       Home Medications    Prior to Admission medications   Medication Sig Start Date End Date Taking? Authorizing Provider    clindamycin (CLEOCIN) 300 MG capsule Take 1 capsule (300 mg total) by mouth 3 (three) times daily. 01/22/15   Billy Fischer, MD  cyclobenzaprine (FLEXERIL) 10 MG tablet Take 1 tablet (10 mg total) by mouth 2 (two) times daily as needed for muscle spasms. 05/11/15   Delsa Grana, PA-C  diclofenac (CATAFLAM) 50 MG tablet Take 1 tablet (50 mg total) by mouth 3 (three) times daily. For knee pain 01/04/16   Billy Fischer, MD  guaiFENesin-dextromethorphan [facility] DM) 100-10 MG/5ML syrup Take 5 mLs by mouth every 4 (four) hours as needed for cough. 04/12/16   Clayton Bibles, PA-C  HYDROcodone-homatropine [facility]) 5-1.5 MG/5ML syrup Take 5 mLs by mouth every 6 (six) hours as needed for cough (and pain). 04/12/16   Clayton Bibles, PA-C  ibuprofen (ADVIL,MOTRIN) 800 MG tablet Take 1 tablet (800 mg total) by mouth 3 (three) times daily. 05/11/15   Delsa Grana, PA-C    Family History Family History  Problem Relation Age of Onset  . Hypertension Mother   . Diabetes Mother   . Hypertension Father     Social History Social History  Substance Use Topics  . Smoking status: Current Every Day Smoker    Packs/day: 0.10    Types: Cigarettes  . Smokeless tobacco: Never Used  . Alcohol use Yes     Comment: socailly     Allergies   Review of patient's allergies indicates no known allergies.   Review of Systems Review of Systems  Constitutional: Negative for chills and fever.  HENT: Positive for congestion and sore throat. Negative for ear pain.   Respiratory: Positive for shortness of breath.   Gastrointestinal: Negative for abdominal pain, nausea and vomiting.  Genitourinary: Negative for dysuria.  Musculoskeletal: Positive for myalgias.  Skin: Negative for rash.  All other systems reviewed and are negative.    Physical Exam Updated Vital Signs BP 126/96 (BP Location: Right Arm)   Pulse 94   Temp 98.4 F (36.9 C)   Resp 18   Ht 5\' 9"  (1.753 m)   Wt 72.6 kg   LMP 03/15/2016   SpO2 98%    BMI 23.63 kg/m   Physical Exam  Constitutional: She appears well-developed and well-nourished. No distress.  HENT:  Head: Normocephalic and atraumatic.  Nose: Mucosal edema present.  Mouth/Throat: Oropharynx is clear and moist. No oropharyngeal exudate, posterior oropharyngeal edema or posterior oropharyngeal erythema.  Nasal mucosa erythematous and edematous Oropharynx without erythema, edema, or exudates Harsh cough  Eyes: Conjunctivae are normal.  Neck: Neck supple.  Cardiovascular: Normal rate and regular rhythm.   Pulmonary/Chest: Effort normal. No respiratory distress. She has decreased breath sounds. She has no wheezes. She has no rales.  Slight decrease in breath sounds throughout   Neurological: She is alert.  Skin: She is not diaphoretic.  Nursing note and vitals reviewed.    ED Treatments / Results  DIAGNOSTIC STUDIES: Oxygen Saturation is 98% on RA, normal by my interpretation.    COORDINATION OF CARE: 9:13 AM- Pt advised of plan for treatment and pt agrees. Pt will receive chest x-ray.    Labs (all labs ordered are listed, but only abnormal results are displayed) Labs Reviewed  RAPID STREP SCREEN (NOT AT [facility])  CULTURE, GROUP A STREP [facility])    EKG  EKG Interpretation None       Radiology Dg Chest 2 View  Result Date: 04/12/2016 CLINICAL DATA:  Progressive cough EXAM: CHEST  2 VIEW COMPARISON:  None. FINDINGS: The lungs are clear. Heart size and pulmonary vascularity are normal. No adenopathy. There is atherosclerotic calcification in the aortic arch. No adenopathy. There is mid thoracic dextroscoliosis. IMPRESSION: Aortic atherosclerosis.  No edema or consolidation. Electronically Signed   By: Lowella Grip III M.D.   On: 04/12/2016 09:42    Procedures Procedures (including critical care time)  Medications Ordered in ED Medications  albuterol (PROVENTIL HFA;VENTOLIN HFA) 108 (90 Base) MCG/ACT inhaler 2 puff (2 puffs Inhalation Given 04/12/16  0926)     Initial Impression / Assessment and Plan / ED Course  Clayton Bibles, PA-C has reviewed the triage vital signs and the nursing notes.  Pertinent labs & imaging results that were available during my care of the patient were reviewed by me and considered in my medical decision making (see chart for details).  Clinical Course   Pt CXR negative for acute infiltrate. Patients symptoms are consistent with URI, likely viral etiology. Discussed that antibiotics are not indicated for viral infections. Pt will be discharged with symptomatic treatment.  Verbalizes understanding and is agreeable with plan. Pt is hemodynamically stable & in NAD prior to dc. Discussed result, findings, treatment, and follow up  with patient.  Pt given return precautions.  Pt verbalizes understanding and agrees with plan.      Final Clinical Impressions(s) / ED Diagnoses   Final diagnoses:  Viral URI with cough    New Prescriptions Discharge Medication List as of 04/12/2016 10:43 AM    START taking these medications  Details  guaiFENesin-dextromethorphan (ROBITUSSIN DM) 100-10 MG/5ML syrup Take 5 mLs by mouth every 4 (four) hours as needed for cough., Starting Mon 04/12/2016, Print    HYDROcodone-homatropine (HYCODAN) 5-1.5 MG/5ML syrup Take 5 mLs by mouth every 6 (six) hours as needed for cough (and pain)., Starting Mon 04/12/2016, Print       I personally performed the services described in this documentation, which was scribed in my presence. The recorded information has been reviewed and is accurate.    Clayton Bibles, PA-C 04/12/16 [place], MD 04/12/16 2212

## 2016-04-12 NOTE — Discharge Instructions (Signed)
Read the information below.  Use the prescribed medication as directed.  Please discuss all new medications with your pharmacist.  You may return to the Emergency Department at any time for worsening condition or any new symptoms that concern you.  If you develop high fevers that do not resolve with tylenol or ibuprofen, you have difficulty swallowing or breathing, or you are unable to tolerate fluids by mouth, return to the ER for a recheck.    °

## 2016-04-12 NOTE — ED Triage Notes (Signed)
Pt states she has been having a cough for 5-6 days with nasal congestion. Pt states her throat hurts as well.

## 2016-04-14 LAB — CULTURE, GROUP A STREP (THRC)

## 2017-01-31 ENCOUNTER — Encounter (INDEPENDENT_AMBULATORY_CARE_PROVIDER_SITE_OTHER): Payer: Self-pay | Admitting: Orthopedic Surgery

## 2017-01-31 ENCOUNTER — Ambulatory Visit (INDEPENDENT_AMBULATORY_CARE_PROVIDER_SITE_OTHER): Payer: Self-pay | Admitting: Orthopedic Surgery

## 2017-01-31 VITALS — Ht 69.0 in | Wt 160.0 lb

## 2017-01-31 DIAGNOSIS — M1711 Unilateral primary osteoarthritis, right knee: Secondary | ICD-10-CM | POA: Insufficient documentation

## 2017-01-31 MED ORDER — METHYLPREDNISOLONE ACETATE 40 MG/ML IJ SUSP
40.0000 mg | INTRAMUSCULAR | Status: AC | PRN
  Administered 2017-01-31: 40 mg via INTRA_ARTICULAR

## 2017-01-31 MED ORDER — LIDOCAINE HCL 1 % IJ SOLN
5.0000 mL | INTRAMUSCULAR | Status: AC | PRN
  Administered 2017-01-31: 5 mL
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
## 2017-02-05 ENCOUNTER — Emergency Department (HOSPITAL_COMMUNITY)
Admission: EM | Admit: 2017-02-05 | Discharge: 2017-02-06 | Disposition: A | Discharge status: Home / Self Care | Payer: Self-pay | Attending: Emergency Medicine | Admitting: Emergency Medicine

## 2017-02-05 ENCOUNTER — Encounter (HOSPITAL_COMMUNITY): Payer: Self-pay

## 2017-02-05 DIAGNOSIS — T1490XA Injury, unspecified, initial encounter: Secondary | ICD-10-CM

## 2017-02-05 DIAGNOSIS — W19XXXA Unspecified fall, initial encounter: Secondary | ICD-10-CM

## 2017-02-05 DIAGNOSIS — Y929 Unspecified place or not applicable: Secondary | ICD-10-CM | POA: Insufficient documentation

## 2017-02-05 DIAGNOSIS — M25521 Pain in right elbow: Secondary | ICD-10-CM | POA: Insufficient documentation

## 2017-02-05 DIAGNOSIS — W541XXA Struck by dog, initial encounter: Secondary | ICD-10-CM | POA: Insufficient documentation

## 2017-02-05 DIAGNOSIS — Y939 Activity, unspecified: Secondary | ICD-10-CM | POA: Insufficient documentation

## 2017-02-05 DIAGNOSIS — Y999 Unspecified external cause status: Secondary | ICD-10-CM | POA: Insufficient documentation

## 2017-02-05 DIAGNOSIS — F17210 Nicotine dependence, cigarettes, uncomplicated: Secondary | ICD-10-CM | POA: Insufficient documentation

## 2017-02-05 DIAGNOSIS — S90415A Abrasion, left lesser toe(s), initial encounter: Secondary | ICD-10-CM | POA: Insufficient documentation

## 2017-02-05 DIAGNOSIS — M79651 Pain in right thigh: Secondary | ICD-10-CM | POA: Insufficient documentation

## 2017-02-05 DIAGNOSIS — S60512A Abrasion of left hand, initial encounter: Secondary | ICD-10-CM | POA: Insufficient documentation

## 2017-02-05 MED ORDER — OXYCODONE-ACETAMINOPHEN 5-325 MG PO TABS
1.0000 | ORAL_TABLET | ORAL | Status: DC | PRN
  Administered 2017-02-05: 1 via ORAL

## 2017-02-05 MED ORDER — OXYCODONE-ACETAMINOPHEN 5-325 MG PO TABS
ORAL_TABLET | ORAL | Status: AC
  Filled 2017-02-05: qty 1
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
## 2017-02-05 NOTE — ED Provider Notes (Signed)
[place] DEPT Provider Note   CSN: [id number] Arrival date & time: 02/05/17  2241   By signing my name below, I, Eunice Blase, attest that this documentation has been prepared under the direction and in the presence of Quincy Carnes, PA-C. Electronically signed, Eunice Blase, ED Scribe. 02/05/17. 11:39 PM.   History   Chief Complaint Chief Complaint  Patient presents with  . Elbow Injury  . Leg Injury   The history is provided by the patient and medical records. No language interpreter was used.    Gina Ross is a 46 y.o. female with h/o arthritis presenting to the Emergency Department concerning multiple painful areas onset this evening. She states her dog knocked her over. She notes R elbow and right thigh pain, and she reports a skin tear to the L foot and abrasions to the L hand. Pt describes 10/10, constant soreness and aching to affected areas. Pt states she is unable to bear weight on the R leg or extend the R arm d/t aforementioned pains. No head trauma/ LOC. No other complaints at this time.   Past Medical History:  Diagnosis Date  . Asthma    as a child    Patient Active Problem List   Diagnosis Date Noted  . Unilateral primary osteoarthritis, right knee 01/31/2017    Past Surgical History:  Procedure Laterality Date  . COLONOSCOPY    . HARDWARE REMOVAL Right 09/11/2014   Procedure: Removal Deep Hardware Right Knee;  Surgeon: Newt Minion, MD;  Location: [facility];  Service: Orthopedics;  Laterality: Right;  . ORIF PATELLA Right 03/22/2014   Procedure: Open Reduction Internal Fixation Right Patella ;  Surgeon: Newt Minion, MD;  Location: [facility];  Service: Orthopedics;  Laterality: Right;    OB History    No data available       Home Medications    Prior to Admission medications   Medication Sig Start Date End Date Taking? Authorizing Provider  clindamycin (CLEOCIN) 300 MG capsule Take 1 capsule (300 mg total) by mouth 3 (three) times daily.  01/22/15   Billy Fischer, MD  cyclobenzaprine (FLEXERIL) 10 MG tablet Take 1 tablet (10 mg total) by mouth 2 (two) times daily as needed for muscle spasms. 05/11/15   Delsa Grana, PA-C  diclofenac (CATAFLAM) 50 MG tablet Take 1 tablet (50 mg total) by mouth 3 (three) times daily. For knee pain 01/04/16   Billy Fischer, MD  guaiFENesin-dextromethorphan (ROBITUSSIN DM) 100-10 MG/5ML syrup Take 5 mLs by mouth every 4 (four) hours as needed for cough. 04/12/16   Clayton Bibles, PA-C  HYDROcodone-homatropine (HYCODAN) 5-1.5 MG/5ML syrup Take 5 mLs by mouth every 6 (six) hours as needed for cough (and pain). 04/12/16   Clayton Bibles, PA-C  ibuprofen (ADVIL,MOTRIN) 800 MG tablet Take 1 tablet (800 mg total) by mouth 3 (three) times daily. 05/11/15   Delsa Grana, PA-C    Family History Family History  Problem Relation Age of Onset  . Hypertension Mother   . Diabetes Mother   . Hypertension Father     Social History Social History  Substance Use Topics  . Smoking status: Current Every Day Smoker    Packs/day: 0.10    Types: Cigarettes  . Smokeless tobacco: Never Used  . Alcohol use Yes     Comment: socailly     Allergies   Patient has no known allergies.   Review of Systems Review of Systems  Constitutional: Negative for diaphoresis and fever.  Respiratory: Negative for shortness of breath.   Gastrointestinal: Negative for nausea and vomiting.  Musculoskeletal: Positive for arthralgias.  Skin: Negative for color change and wound.  Neurological: Negative for weakness and numbness.  All other systems reviewed and are negative.    Physical Exam Updated Vital Signs BP (!) 135/100   Pulse 90   Temp 98.4 F (36.9 C) (Oral)   Resp 16   LMP 01/16/2017   SpO2 100%   Physical Exam  Constitutional: She is oriented to person, place, and time. She appears well-developed and well-nourished.  HENT:  Head: Normocephalic and atraumatic.  Mouth/Throat: Oropharynx is clear and moist.    Eyes: Pupils are equal, round, and reactive to light. Conjunctivae and EOM are normal.  Neck: Normal range of motion.  Cardiovascular: Normal rate, regular rhythm and normal heart sounds.   Pulmonary/Chest: Effort normal and breath sounds normal.  Abdominal: Soft. Bowel sounds are normal.  Musculoskeletal: Normal range of motion.  Abrasion to left lateral 5th toe and left base of left hand; no deformities Tenderness of left proximal thigh; no hip tenderness; no bony deformities or swelling, no leg shortening Right arm held across the chest, flexed at the elbow, tenderness over the lateral epicondyle without noted deformity; patient unwilling to extend left elbow during exam Extremity pulses intact x4, normal sensation throughout  Neurological: She is alert and oriented to person, place, and time.  Skin: Skin is warm and dry.  Psychiatric: She has a normal mood and affect.  Nursing note and vitals reviewed.    ED Treatments / Results  DIAGNOSTIC STUDIES: Oxygen Saturation is 100% on RA, NL by my interpretation.    COORDINATION OF CARE: 11:17 PM-Discussed next steps with pt. Pt verbalized understanding and is agreeable with the plan. Will order imaging.   Labs (all labs ordered are listed, but only abnormal results are displayed) Labs Reviewed - No data to display  EKG  EKG Interpretation None       Radiology Dg Elbow Complete Right  Result Date: 02/06/2017 CLINICAL DATA:  Status post fall, with right elbow pain. Initial encounter. EXAM: RIGHT ELBOW - COMPLETE 3+ VIEW COMPARISON:  Right humerus radiographs performed 10/22/2011 FINDINGS: An apparent elbow joint effusion is noted. There is no definite evidence of fracture or dislocation. No definite soft tissue abnormalities are characterized. IMPRESSION: Apparent elbow joint effusion noted. No definite fracture seen. If the patient's symptoms persist, follow-up imaging could be performed. Electronically Signed   By: Garald Balding M.D.   On: 02/06/2017 00:46   Dg Femur, Min 2 Views Right  Result Date: 02/06/2017 CLINICAL DATA:  Acute onset of right femur pain, after fall. Initial encounter. EXAM: RIGHT FEMUR 2 VIEWS COMPARISON:  None. FINDINGS: The right femur appears intact. There is no evidence of fracture or dislocation. The right femoral head remains seated at the acetabulum. The knee joint is unremarkable. No knee joint effusion is identified. No definite soft tissue abnormalities are characterized on radiograph. IMPRESSION: No evidence of fracture or dislocation. Electronically Signed   By: Garald Balding M.D.   On: 02/06/2017 00:47    Procedures Procedures (including critical care time)  Medications Ordered in ED Medications  oxyCODONE-acetaminophen (PERCOCET/ROXICET) 5-325 MG per tablet 1 tablet (1 tablet Oral Given 02/05/17 2304)     Initial Impression / Assessment and Plan / ED Course  I have reviewed the triage vital signs and the nursing notes.  Pertinent labs & imaging results that were available during my care of the  patient were reviewed by me and considered in my medical decision making (see chart for details).  46 y.o. F here with right thigh and right elbow pain after her 30 lbs pitbull dog knocked her over.  No head injury or LOC.  No acute deformities noted on exam.  No leg shortening.  Pelvis stable.  X-rays pending.  X-rays negative for acute bony findings.  Elbow effusion seen.  Ace wrap applied as well as shoulder sling.  Discussed NSAIDs, RICE routine at home.  Follow-up with PCP if any ongoing issues.  Discussed plan with patient, she acknowledged understanding and agreed with plan of care.  Return precautions given for new or worsening symptoms. Final Clinical Impressions(s) / ED Diagnoses   Final diagnoses:  Fall, initial encounter  Right thigh pain  Right elbow pain    New Prescriptions New Prescriptions   NAPROXEN (NAPROSYN) 500 MG TABLET    Take 1 tablet (500 mg total) by  mouth 2 (two) times daily with a meal.   NAPROXEN (NAPROSYN) 500 MG TABLET    Take 1 tablet (500 mg total) by mouth 2 (two) times daily with a meal.   I personally performed the services described in this documentation, which was scribed in my presence. The recorded information has been reviewed and is accurate.   Larene Pickett, PA-C 02/06/17 0055    Quintella Reichert, MD 02/18/17 [phone]

## 2017-02-06 ENCOUNTER — Emergency Department (HOSPITAL_COMMUNITY): Payer: Self-pay

## 2017-02-06 MED ORDER — NAPROXEN 500 MG PO TABS: 500.0000 mg | ORAL_TABLET | Freq: Two times a day (BID) | ORAL | 0 refills | Status: DC

## 2017-02-06 NOTE — ED Notes (Signed)
Patient transported to X-ray 

## 2017-02-06 NOTE — Discharge Instructions (Signed)
Take the prescribed medication as directed. °Follow-up with your primary care doctor if any ongoing issues. °Return to the ED for new or worsening symptoms. °

## 2017-02-06 NOTE — ED Notes (Signed)
Pt given ice pack for elbow.

## 2017-02-14 ENCOUNTER — Ambulatory Visit (HOSPITAL_COMMUNITY)
Admission: EM | Admit: 2017-02-14 | Discharge: 2017-02-14 | Disposition: A | Discharge status: Home / Self Care | Payer: Medicaid Other | Attending: Family Medicine | Admitting: Family Medicine

## 2017-02-14 ENCOUNTER — Encounter (HOSPITAL_COMMUNITY): Payer: Self-pay | Admitting: Emergency Medicine

## 2017-02-14 DIAGNOSIS — M79604 Pain in right leg: Secondary | ICD-10-CM

## 2017-02-14 MED ORDER — DICLOFENAC SODIUM 75 MG PO TBEC: 75.0000 mg | DELAYED_RELEASE_TABLET | Freq: Two times a day (BID) | ORAL | 0 refills | Status: DC

## 2017-02-14 NOTE — ED Triage Notes (Addendum)
8/11 patients dog knocked patient over.  Patient went to ed -xrayed, but patient not pleased with where she was xrayed, patient was prescribed with naproxen.  Currently pain in right hip, hurts with movement.    Patient did not get naproxen and convinced something more significant is wrong.  Patient thinks the wrong area was x-rayed

## 2017-02-14 NOTE — ED Provider Notes (Signed)
  [place]   [id number] 02/14/17 Arrival Time: 1005   SUBJECTIVE:  Gina Ross is a 46 y.o. female who presents to the urgent care with complaint of right leg pain since 02/05/2017. She states that on that day, she was walking her dog, when her dog knocked her over causing her to fall on her right side. She was evaluated at the emergency department, and had x-rays done of her leg hip and elbow, however she is unsatisfied with these images, and states "I think it's broke" she was given naproxen for pain, and states that this is done nothing for her pain. She is able to walk, she works from home via the computer, has no other significant history  ROS: As per HPI, remainder of ROS negative.   OBJECTIVE:  Vitals:   02/14/17 1037  BP: (!) 147/80  Pulse: 84  Resp: 16  Temp: 98.7 F (37.1 C)  TempSrc: Oral  SpO2: 98%     General appearance: alert; no distress, appears stated age HEENT: normocephalic; atraumatic; conjunctivae normal;  Musculoskeletal: No patellar instability, sensation intact distally from the side of her injury, pulses in the right foot are +2, there is no bruising seen on the leg.  Skin: warm and dry Neurologic: Grossly normal, fluid gait Psychological:  alert and cooperative; normal mood and affect     ASSESSMENT & PLAN:  1. Right leg pain     Meds ordered this encounter  Medications  . diclofenac (VOLTAREN) 75 MG EC tablet    Sig: Take 1 tablet (75 mg total) by mouth 2 (two) times daily.    Dispense:  20 tablet    Refill:  0    Order Specific Question:   Supervising Provider    Answer:   Robyn Haber [[id number]]   Images from her ER visit on 8/11 were reviewed, along with radiology report, there is no evidence of underlying fracture to the leg. We will switch her anti-inflammatories, follow up with orthopedics as needed  Reviewed expectations re: course of current medical issues. Questions answered. Outlined signs and symptoms  indicating need for more acute intervention. Patient verbalized understanding. After Visit Summary given.    Procedures:     Labs Reviewed - No data to display  No results found.  Allergies  Allergen Reactions  . Penicillins     PMHx, SurgHx, SocialHx, Medications, and Allergies were reviewed in the Visit Navigator and updated as appropriate.       [name], NP 02/14/17 1106

## 2017-02-14 NOTE — Discharge Instructions (Signed)
I have reviewed the X-rays from the emergency department and the radiology reports, you have no fracture, no dislocations, or other visible bony injury to your leg. I will switch your medicine, stop taking the naproxen, I will prescribe diclofenac one tablet twice a day, you can take Tylenol with this. If pain persist follow up with orthopedics

## 2017-02-15 ENCOUNTER — Ambulatory Visit (INDEPENDENT_AMBULATORY_CARE_PROVIDER_SITE_OTHER): Payer: Medicaid Other | Admitting: Orthopedic Surgery

## 2017-02-21 ENCOUNTER — Ambulatory Visit (INDEPENDENT_AMBULATORY_CARE_PROVIDER_SITE_OTHER): Payer: Self-pay

## 2017-02-21 ENCOUNTER — Encounter (HOSPITAL_COMMUNITY): Payer: Self-pay | Admitting: Emergency Medicine

## 2017-02-21 ENCOUNTER — Ambulatory Visit (HOSPITAL_COMMUNITY)
Admission: EM | Admit: 2017-02-21 | Discharge: 2017-02-21 | Disposition: A | Discharge status: Home / Self Care | Payer: Self-pay | Attending: Internal Medicine | Admitting: Internal Medicine

## 2017-02-21 DIAGNOSIS — S92154A Nondisplaced avulsion fracture (chip fracture) of right talus, initial encounter for closed fracture: Secondary | ICD-10-CM

## 2017-02-21 DIAGNOSIS — S93401A Sprain of unspecified ligament of right ankle, initial encounter: Secondary | ICD-10-CM

## 2017-02-21 MED ORDER — HYDROCODONE-ACETAMINOPHEN 5-325 MG PO TABS
ORAL_TABLET | ORAL | Status: AC
  Filled 2017-02-21: qty 2

## 2017-02-21 MED ORDER — MELOXICAM 15 MG PO TABS: 15.0000 mg | ORAL_TABLET | Freq: Every day | ORAL | 0 refills | Status: DC

## 2017-02-21 MED ORDER — HYDROCODONE-ACETAMINOPHEN 5-325 MG PO TABS: 1.0000 | ORAL_TABLET | ORAL | 0 refills | Status: DC | PRN

## 2017-02-21 MED ORDER — HYDROCODONE-ACETAMINOPHEN 5-325 MG PO TABS
2.0000 | ORAL_TABLET | Freq: Once | ORAL | Status: AC
  Administered 2017-02-21: 2 via ORAL

## 2017-02-21 NOTE — ED Provider Notes (Signed)
[place]   [id number] 02/21/17 Arrival Time: 1822   SUBJECTIVE:  Gina Ross is a 46 y.o. female who presents to the urgent care  with complaint of right foot and ankle pain. States that on Friday she missed a step in her foot bent under her and she fell. Did not hit her head, no loss of consciousness, no headaches. She has been walking on her foot states is been quite painful, she has had significant swelling. Denies other significant history.  ROS: As per HPI, remainder of ROS negative.   OBJECTIVE:  Vitals:   02/21/17 1930  BP: (!) 147/85  Pulse: 79  Resp: 18  Temp: 99 F (37.2 C)  TempSrc: Oral  SpO2: 100%     General appearance: alert; no distress HEENT: normocephalic; atraumatic; conjunctivae normal;  Neck: Trachea midline no JVD noted Lungs: clear to auscultation bilaterally Heart: regular rate and rhythm Abdomen: soft, non-tender;  Musculoskeletal: Tenderness over the lateral malleolus, noted swelling to the foot, dorsalis pedis and posterior tibial pulses are intact, capillary refills less than 2 seconds, sensory function remains intact throughout the foot, area of hematoma along the lateral aspect of the foot. Skin: warm and dry Neurologic: Grossly normal Psychological:  alert and cooperative; normal mood and affect     ASSESSMENT & PLAN:  1. Closed nondisplaced avulsion fracture of right talus, initial encounter     Meds ordered this encounter  Medications  . HYDROcodone-acetaminophen (NORCO/VICODIN) 5-325 MG per tablet 2 tablet  . DISCONTD: meloxicam (MOBIC) 15 MG tablet    Sig: Take 1 tablet (15 mg total) by mouth daily.    Dispense:  30 tablet    Refill:  0    Order Specific Question:   Supervising Provider    Answer:   Vanessa Kick [id number]  . DISCONTD: meloxicam (MOBIC) 15 MG tablet    Sig: Take 1 tablet (15 mg total) by mouth daily.    Dispense:  30 tablet    Refill:  0    Order Specific Question:   Supervising Provider      Answer:   Vanessa Kick [id number]  . HYDROcodone-acetaminophen (NORCO/VICODIN) 5-325 MG tablet    Sig: Take 1 tablet by mouth every 4 (four) hours as needed.    Dispense:  15 tablet    Refill:  0    Order Specific Question:   Supervising Provider    Answer:   Vanessa Kick [[id number]]    Avulsion fracture to the right talus, and referred to orthopedics for further management stressed the importance of maintaining non-weight-bearing status, encouraged use of crutches.  [place] controlled substances reporting system consulted prior to issuing prescription, with the following findings below:  No controlled substances issued in the last 6 months  Reviewed expectations re: course of current medical issues. Questions answered. Outlined signs and symptoms indicating need for more acute intervention. Patient verbalized understanding. After Visit Summary given.    Procedures:    Labs Reviewed - No data to display  Dg Ankle Complete Right  Result Date: 02/21/2017 CLINICAL DATA:  Missed bottom step on porch and rolled her RIGHT foot and ankle 3 days ago, bruising and swelling post fall EXAM: RIGHT ANKLE - COMPLETE 3+ VIEW COMPARISON:  03/16/2014 FINDINGS: Diffuse soft tissue swelling. Low normal osseous mineralization. Joint spaces preserved. Avulsion fracture identified at the dorsal margin of the distal talus question capsular avulsion from talonavicular joint. No additional fracture, dislocation or bone destruction. IMPRESSION: Avulsion fracture from the dorsal margin  of distal RIGHT talus. Electronically Signed   By: Lavonia Dana M.D.   On: 02/21/2017 20:18   Dg Foot Complete Right  Result Date: 02/21/2017 CLINICAL DATA:  Missed bottom step on porch and rolled her RIGHT foot and ankle 3 days ago, bruising and swelling post fall EXAM: RIGHT FOOT COMPLETE - 3+ VIEW COMPARISON:  12/09/2005 FINDINGS: Borderline decreased osseous mineralization. Joint spaces preserved. Dorsal soft tissue  swelling of RIGHT foot near the ankle joint. Avulsion fracture identified from the dorsal margin of the distal talus. No additional fracture, dislocation, or bone destruction. IMPRESSION: Avulsion fracture from the dorsal margin of distal RIGHT talus. Electronically Signed   By: Lavonia Dana M.D.   On: 02/21/2017 20:20    Allergies  Allergen Reactions  . Penicillins     PMHx, SurgHx, SocialHx, Medications, and Allergies were reviewed in the Visit Navigator and updated as appropriate.       [name], NP 02/21/17 2057

## 2017-02-21 NOTE — Discharge Instructions (Signed)
You have a avulsion fracture of your right talus. This is a weightbearing bone, do not walk, or bear any weight at all until you are seen by an orthopedist. These often require surgical management, this important you follow-up with orthopedics. I have also prescribed a medicine for pain called hydrocodone, this medicine is a narcotic, it will cause drowsiness, and it is addictive. Do not take more than what is necessary, do not drink alcohol while taking, and do not operate any heavy machinery while taking this medicine.  I recommend rest, ice, continuous wearing of your splint, and elevate your ankle as much as possible. For follow-up care

## 2017-02-21 NOTE — ED Triage Notes (Signed)
On Friday, patient missed a step and reports foot was bent under when she fell.  Right foot and ankle is swollen and painful, visible bruising to lateral ankle.  Patient has 2 plus pulses

## 2017-02-22 MED FILL — HYDROCODON-APAP 5-325: 5-325 | 7 days supply | Qty: 15 | Fill #0

## 2017-02-23 ENCOUNTER — Ambulatory Visit (INDEPENDENT_AMBULATORY_CARE_PROVIDER_SITE_OTHER): Payer: Self-pay | Admitting: Family

## 2017-02-23 ENCOUNTER — Encounter (INDEPENDENT_AMBULATORY_CARE_PROVIDER_SITE_OTHER): Payer: Self-pay | Admitting: Family

## 2017-02-23 DIAGNOSIS — S92151A Displaced avulsion fracture (chip fracture) of right talus, initial encounter for closed fracture: Secondary | ICD-10-CM

## 2017-02-23 NOTE — Progress Notes (Signed)
Office Visit Note   Patient: Gina Ross           Date of Birth: September 09, 1970           MRN: [id number] Visit Date: 02/23/2017              Requested by: No referring provider defined for this encounter. PCP: System, Provider Not In  Chief Complaint  Patient presents with  . Right Ankle - Pain      HPI: The patient is a 46 year old woman who presents today complaining of right ankle pain she had a fall tripping over her dog last Friday she's had swelling and pain. Was evaluated at the [facility] urgent care radiographs revealed a talus avulsion fracture. Is in a fracture boot with crutches has been nonweightbearing. States her swelling is improving.  Assessment & Plan: Visit Diagnoses:  1. Displaced avulsion fracture (chip fracture) of right talus, initial encounter for closed fracture     Plan: Continue with the fracture boot continue nonweightbearing for 2 more weeks. Follow-up in office for radiographs of the right ankle. Continue with elevation and may continue using ice for swelling.  Follow-Up Instructions: Return in about 4 weeks (around 03/23/2017).   Right Ankle Exam  Swelling: moderate  Tenderness  The patient is experiencing tenderness in the ATF, CF and deltoid.   Other  Erythema: absent Pulse: present       Patient is alert, oriented, no adenopathy, well-dressed, normal affect, normal respiratory effort.   Imaging: No results found. No images are attached to the encounter.  Labs: Lab Results  Component Value Date   REPTSTATUS 04/14/2016 FINAL 04/12/2016   GRAMSTAIN  11/01/2008    RARE WBC PRESENT, PREDOMINANTLY PMN NO SQUAMOUS EPITHELIAL CELLS SEEN RARE GRAM POSITIVE COCCI IN PAIRS   CULT NO GROUP A STREP (S.PYOGENES) ISOLATED 04/12/2016    Orders:  No orders of the defined types were placed in this encounter.  No orders of the defined types were placed in this encounter.    Procedures: No procedures performed  Clinical Data: No  additional findings.  ROS:  All other systems negative, except as noted in the HPI. Review of Systems  Constitutional: Negative for chills and fever.  Musculoskeletal: Positive for arthralgias, gait problem and joint swelling.  Skin: Positive for color change.    Objective: Vital Signs: There were no vitals taken for this visit.  Specialty Comments:  No specialty comments available.  PMFS History: Patient Active Problem List   Diagnosis Date Noted  . Unilateral primary osteoarthritis, right knee 01/31/2017   Past Medical History:  Diagnosis Date  . Asthma    as a child    Family History  Problem Relation Age of Onset  . Hypertension Mother   . Diabetes Mother   . Hypertension Father     Past Surgical History:  Procedure Laterality Date  . COLONOSCOPY    . HARDWARE REMOVAL Right 09/11/2014   Procedure: Removal Deep Hardware Right Knee;  Surgeon: Newt Minion, MD;  Location: [facility];  Service: Orthopedics;  Laterality: Right;  . ORIF PATELLA Right 03/22/2014   Procedure: Open Reduction Internal Fixation Right Patella ;  Surgeon: Newt Minion, MD;  Location: [place] [facility];  Service: Orthopedics;  Laterality: Right;   Social History   Occupational History  . Not on file.   Social History Main Topics  . Smoking status: Current Every Day Smoker    Packs/day: 0.10    Types: Cigarettes  . Smokeless  tobacco: Never Used  . Alcohol use Yes     Comment: socailly  . Drug use: No  . Sexual activity: Yes    Birth control/ protection: None

## 2017-03-09 ENCOUNTER — Ambulatory Visit (INDEPENDENT_AMBULATORY_CARE_PROVIDER_SITE_OTHER): Payer: Medicaid Other | Admitting: Family

## 2017-03-23 ENCOUNTER — Ambulatory Visit (INDEPENDENT_AMBULATORY_CARE_PROVIDER_SITE_OTHER): Payer: Self-pay

## 2017-03-23 ENCOUNTER — Ambulatory Visit (INDEPENDENT_AMBULATORY_CARE_PROVIDER_SITE_OTHER): Payer: Medicaid Other | Admitting: Family

## 2017-03-23 ENCOUNTER — Encounter (INDEPENDENT_AMBULATORY_CARE_PROVIDER_SITE_OTHER): Payer: Self-pay | Admitting: Family

## 2017-03-23 DIAGNOSIS — S92151A Displaced avulsion fracture (chip fracture) of right talus, initial encounter for closed fracture: Secondary | ICD-10-CM

## 2017-03-23 NOTE — Progress Notes (Signed)
Office Visit Note   Patient: Gina Ross           Date of Birth: [birth year]           MRN: [id number] Visit Date: 03/23/2017              Requested by: No referring provider defined for this encounter. PCP: System, Provider Not In  Chief Complaint  Patient presents with  . Right Ankle - Follow-up      HPI: The patient is a 46 year old woman who presents today in follow up for a talus avulsion fracture is 4 weeks out.. Is in a fracture boot with full weightbearing. States her swelling is improving. Pain improving slowly. Complains the boot is painful and heavy. Would like a smaller different boot to improve her ambulation.   Assessment & Plan: Visit Diagnoses:  1. Displaced avulsion fracture (chip fracture) of right talus, initial encounter for closed fracture     Plan: Continue with the fracture boot continue for 2 more weeks. Continue with elevation and may continue using ice for swelling. Hope to advance to regular shoe wear in 2 weeks.   Follow-Up Instructions: Return in about 2 weeks (around 04/06/2017).   Right Ankle Exam  Swelling: mild  Tenderness  The patient is experiencing tenderness in the ATF.   Other  Erythema: absent Pulse: present   Comments:  Tender over dorsum of foot and ankle      Patient is alert, oriented, no adenopathy, well-dressed, normal affect, normal respiratory effort.   Imaging: Xr Ankle Complete Right  Result Date: 03/23/2017 Radiographs of the right ankle show congruent mortise. the small talar avulsion fracture is stable. there were no complicating features  No images are attached to the encounter.  Labs: Lab Results  Component Value Date   REPTSTATUS 04/14/2016 FINAL 04/12/2016   GRAMSTAIN  11/01/2008    RARE WBC PRESENT, PREDOMINANTLY PMN NO SQUAMOUS EPITHELIAL CELLS SEEN RARE GRAM POSITIVE COCCI IN PAIRS   CULT NO GROUP A STREP (S.PYOGENES) ISOLATED 04/12/2016    Orders:  Orders Placed This Encounter    Procedures  . XR Ankle Complete Right   No orders of the defined types were placed in this encounter.    Procedures: No procedures performed  Clinical Data: No additional findings.  ROS:  All other systems negative, except as noted in the HPI. Review of Systems  Constitutional: Negative for chills and fever.  Musculoskeletal: Positive for arthralgias, gait problem and joint swelling.  Skin: Positive for color change.    Objective: Vital Signs: There were no vitals taken for this visit.  Specialty Comments:  No specialty comments available.  PMFS History: Patient Active Problem List   Diagnosis Date Noted  . Unilateral primary osteoarthritis, right knee 01/31/2017   Past Medical History:  Diagnosis Date  . Asthma    as a child    Family History  Problem Relation Age of Onset  . Hypertension Mother   . Diabetes Mother   . Hypertension Father     Past Surgical History:  Procedure Laterality Date  . COLONOSCOPY    . HARDWARE REMOVAL Right 09/11/2014   Procedure: Removal Deep Hardware Right Knee;  Surgeon: Newt Minion, MD;  Location: [place];  Service: Orthopedics;  Laterality: Right;  . ORIF PATELLA Right 03/22/2014   Procedure: Open Reduction Internal Fixation Right Patella ;  Surgeon: Newt Minion, MD;  Location: [place];  Service: Orthopedics;  Laterality: Right;   Social History  Occupational History  . Not on file.   Social History Main Topics  . Smoking status: Current Every Day Smoker    Packs/day: 0.10    Types: Cigarettes  . Smokeless tobacco: Never Used  . Alcohol use Yes     Comment: socailly  . Drug use: No  . Sexual activity: Yes    Birth control/ protection: None

## 2017-03-24 ENCOUNTER — Telehealth (INDEPENDENT_AMBULATORY_CARE_PROVIDER_SITE_OTHER): Payer: Self-pay | Admitting: Family

## 2017-03-24 MED ORDER — IBUPROFEN 800 MG PO TABS: 800.0000 mg | ORAL_TABLET | Freq: Three times a day (TID) | ORAL | 0 refills | Status: DC | PRN

## 2017-03-24 NOTE — Telephone Encounter (Signed)
Patient called inquiring about the prescription that was suppose to be called into her pharmacy.  CB#[phone].  Thank  You.

## 2017-03-24 NOTE — Telephone Encounter (Signed)
I called patient to ask what rx and what pharmacy. She states she just wants something for pain. Advised she would not be getting narcotic. She could get ibuprofen she has taken in the past, rx sent to [facility] on [address].

## 2017-04-05 ENCOUNTER — Ambulatory Visit (INDEPENDENT_AMBULATORY_CARE_PROVIDER_SITE_OTHER): Payer: Self-pay

## 2017-04-05 ENCOUNTER — Ambulatory Visit (HOSPITAL_COMMUNITY)
Admission: EM | Admit: 2017-04-05 | Discharge: 2017-04-05 | Disposition: A | Discharge status: Home / Self Care | Payer: Self-pay | Attending: Family Medicine | Admitting: Family Medicine

## 2017-04-05 ENCOUNTER — Encounter (HOSPITAL_COMMUNITY): Payer: Self-pay | Admitting: Emergency Medicine

## 2017-04-05 DIAGNOSIS — S20211A Contusion of right front wall of thorax, initial encounter: Secondary | ICD-10-CM

## 2017-04-05 DIAGNOSIS — R0781 Pleurodynia: Secondary | ICD-10-CM

## 2017-04-05 MED ORDER — KETOROLAC TROMETHAMINE 60 MG/2ML IM SOLN
45.0000 mg | Freq: Once | INTRAMUSCULAR | Status: AC
  Administered 2017-04-05: 45 mg via INTRAMUSCULAR

## 2017-04-05 MED ORDER — KETOROLAC TROMETHAMINE 60 MG/2ML IM SOLN
INTRAMUSCULAR | Status: AC
  Filled 2017-04-05: qty 2

## 2017-04-05 MED ORDER — TRAMADOL HCL 50 MG PO TABS: 50.0000 mg | ORAL_TABLET | Freq: Four times a day (QID) | ORAL | 0 refills | Status: DC | PRN

## 2017-04-05 MED ORDER — NAPROXEN 375 MG PO TABS: 375.0000 mg | ORAL_TABLET | Freq: Two times a day (BID) | ORAL | 0 refills | Status: DC
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
## 2017-04-05 NOTE — Discharge Instructions (Signed)
Apply ice to the area of the ribs that are painful.do this for the next 3-4 days off and on. Take the medication as directed and needed for pain. Follow-up with your primary care doctor for additional pain control. If you develop cough, shortness of breath or coughing up blood go to the emergency department. Expect to have soreness in the ribs for several more days. Xrays are normal.
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
## 2017-04-06 ENCOUNTER — Ambulatory Visit (INDEPENDENT_AMBULATORY_CARE_PROVIDER_SITE_OTHER): Payer: Self-pay | Admitting: Family

## 2017-04-14 ENCOUNTER — Ambulatory Visit (INDEPENDENT_AMBULATORY_CARE_PROVIDER_SITE_OTHER): Payer: Self-pay | Admitting: Orthopedic Surgery

## 2017-04-14 ENCOUNTER — Ambulatory Visit (INDEPENDENT_AMBULATORY_CARE_PROVIDER_SITE_OTHER): Payer: Self-pay

## 2017-04-14 DIAGNOSIS — S92151D Displaced avulsion fracture (chip fracture) of right talus, subsequent encounter for fracture with routine healing: Secondary | ICD-10-CM

## 2017-04-14 DIAGNOSIS — M25531 Pain in right wrist: Secondary | ICD-10-CM

## 2017-04-14 DIAGNOSIS — M79601 Pain in right arm: Secondary | ICD-10-CM
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
## 2017-04-28 ENCOUNTER — Encounter (HOSPITAL_COMMUNITY): Payer: Self-pay | Admitting: Family Medicine

## 2017-04-28 ENCOUNTER — Ambulatory Visit (INDEPENDENT_AMBULATORY_CARE_PROVIDER_SITE_OTHER): Payer: Self-pay

## 2017-04-28 ENCOUNTER — Ambulatory Visit (HOSPITAL_COMMUNITY)
Admission: EM | Admit: 2017-04-28 | Discharge: 2017-04-28 | Disposition: A | Discharge status: Home / Self Care | Payer: Self-pay | Attending: Emergency Medicine | Admitting: Emergency Medicine

## 2017-04-28 DIAGNOSIS — R2231 Localized swelling, mass and lump, right upper limb: Secondary | ICD-10-CM

## 2017-04-28 DIAGNOSIS — S20212A Contusion of left front wall of thorax, initial encounter: Secondary | ICD-10-CM

## 2017-04-28 DIAGNOSIS — W500XXA Accidental hit or strike by another person, initial encounter: Secondary | ICD-10-CM

## 2017-04-28 MED ORDER — NAPROXEN 375 MG PO TABS
375.0000 mg | ORAL_TABLET | Freq: Two times a day (BID) | ORAL | 0 refills | Status: DC
Start: 2017-04-28 — End: 2017-05-10
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
## 2017-05-10 ENCOUNTER — Encounter (HOSPITAL_COMMUNITY): Payer: Self-pay | Admitting: Emergency Medicine

## 2017-05-10 ENCOUNTER — Ambulatory Visit (HOSPITAL_COMMUNITY)
Admission: EM | Admit: 2017-05-10 | Discharge: 2017-05-10 | Disposition: A | Discharge status: Home / Self Care | Payer: Self-pay | Attending: Internal Medicine | Admitting: Internal Medicine

## 2017-05-10 ENCOUNTER — Ambulatory Visit (INDEPENDENT_AMBULATORY_CARE_PROVIDER_SITE_OTHER): Payer: Self-pay

## 2017-05-10 DIAGNOSIS — L209 Atopic dermatitis, unspecified: Secondary | ICD-10-CM

## 2017-05-10 DIAGNOSIS — S62600A Fracture of unspecified phalanx of right index finger, initial encounter for closed fracture: Secondary | ICD-10-CM

## 2017-05-10 MED ORDER — TRIAMCINOLONE ACETONIDE 0.1 % EX CREA: 1.0000 "application " | TOPICAL_CREAM | Freq: Two times a day (BID) | CUTANEOUS | 2 refills | Status: AC

## 2017-05-10 MED ORDER — METHYLPREDNISOLONE SODIUM SUCC 125 MG IJ SOLR
INTRAMUSCULAR | Status: AC
  Filled 2017-05-10: qty 2

## 2017-05-10 MED ORDER — IBUPROFEN 800 MG PO TABS: 800.0000 mg | ORAL_TABLET | Freq: Three times a day (TID) | ORAL | 0 refills | Status: DC

## 2017-05-10 MED ORDER — METHYLPREDNISOLONE SODIUM SUCC 125 MG IJ SOLR
125.0000 mg | Freq: Once | INTRAMUSCULAR | Status: AC
  Administered 2017-05-10: 125 mg via INTRAMUSCULAR

## 2017-05-10 NOTE — ED Triage Notes (Signed)
Pt here with rash to neck and in between legs that is itching

## 2017-05-10 NOTE — ED Provider Notes (Signed)
[place]    CSN: [id number] Arrival date & time: 05/10/17  1002     History   Chief Complaint Chief Complaint  Patient presents with  . Rash    HPI Gina Ross is a 46 y.o. female.   Presenting today for rash on neck and bilateral inner thigh. Rash been present for a long time and comes and goes but flared up 3 days ago. Rash is very itchy. No fever reported. No drainage reported. Patient denies changes in household or personal product.   She also jammed her right 2nd digit finger 3 days ago and the finger is now red and swollen with limited ROM.  Pain is moderate.   HPI  Past Medical History:  Diagnosis Date  . Asthma    as a child    Patient Active Problem List   Diagnosis Date Noted  . Unilateral primary osteoarthritis, right knee 01/31/2017    Past Surgical History:  Procedure Laterality Date  . COLONOSCOPY      OB History    No data available       Home Medications    Prior to Admission medications   Medication Sig Start Date End Date Taking? Authorizing Provider  HYDROcodone-acetaminophen (NORCO/VICODIN) 5-325 MG tablet Take 1 tablet by mouth every 4 (four) hours as needed. 02/21/17   [name], NP  ibuprofen (ADVIL,MOTRIN) 800 MG tablet Take 1 tablet (800 mg total) by mouth every 8 (eight) hours as needed. 03/24/17   Suzan Slick, NP  naproxen (NAPROSYN) 375 MG tablet Take 1 tablet (375 mg total) by mouth 2 (two) times daily. 04/28/17   Zigmund Gottron, NP  traMADol (ULTRAM) 50 MG tablet Take 1 tablet (50 mg total) by mouth every 6 (six) hours as needed. 04/05/17   Janne Napoleon, NP    Family History Family History  Problem Relation Age of Onset  . Hypertension Mother   . Diabetes Mother   . Hypertension Father     Social History Social History   Tobacco Use  . Smoking status: Current Every Day Smoker    Packs/day: 0.10    Types: Cigarettes  . Smokeless tobacco: Never Used  Substance Use Topics  . Alcohol use:  Yes    Comment: socailly  . Drug use: No     Allergies   Penicillins   Review of Systems Review of Systems  Constitutional:       See HPI     Physical Exam Triage Vital Signs ED Triage Vitals [05/10/17 1023]  Enc Vitals Group     BP (!) 148/103     Pulse Rate 95     Resp 18     Temp 98.5 F (36.9 C)     Temp Source Oral     SpO2 99 %     Weight      Height      Head Circumference      Peak Flow      Pain Score 5     Pain Loc      Pain Edu?      Excl. in [place]?    No data found.  Updated Vital Signs BP (!) 148/103 (BP Location: Right Arm)   Pulse 95   Temp 98.5 F (36.9 C) (Oral)   Resp 18   LMP 04/13/2017 (Exact Date)   SpO2 99%   Visual Acuity Right Eye Distance:   Left Eye Distance:   Bilateral Distance:    Right Eye  Near:   Left Eye Near:    Bilateral Near:     Physical Exam  Constitutional: She is oriented to person, place, and time. She appears well-developed and well-nourished.  Cardiovascular: Normal rate, regular rhythm and normal heart sounds.  Pulmonary/Chest: Effort normal and breath sounds normal. She has no wheezes.  Musculoskeletal:  Right 2nd digit finger is swollen and tender at the PIP joint with limited ROM. Sensation intact.  Neurological: She is alert and oriented to person, place, and time.  Skin:  See picture below; same rash on bilateral inner thigh.   Nursing note and vitals reviewed.      UC Treatments / Results  Labs (all labs ordered are listed, but only abnormal results are displayed) Labs Reviewed - No data to display  EKG  EKG Interpretation None       Radiology Dg Finger Index Right  Result Date: 05/10/2017 CLINICAL DATA:  Injury second digit. EXAM: RIGHT INDEX FINGER 2+V COMPARISON:  No recent prior . FINDINGS: Slightly displaced fracture noted along the base of the middle phalanx of the right second digit. The fracture may extend into the proximal interphalangeal joint space. Soft tissue swelling  noted. No radiopaque foreign body. IMPRESSION: Slight displaced fracture of the base of the middle phalanx of the right second digit. The fracture may extend into the proximal interphalangeal joint space. Electronically Signed   By: Marcello Moores  Register   On: 05/10/2017 11:22    Procedures Procedures (including critical care time)  Medications Ordered in UC Medications  methylPREDNISolone sodium succinate (SOLU-MEDROL) 125 mg/2 mL injection 125 mg (125 mg Intramuscular Given 05/10/17 1109)     Initial Impression / Assessment and Plan / UC Course  I have reviewed the triage vital signs and the nursing notes.  Pertinent labs & imaging results that were available during my care of the patient were reviewed by me and considered in my medical decision making (see chart for details).  Final Clinical Impressions(s) / UC Diagnoses   Final diagnoses:  Atopic dermatitis, unspecified type  Closed displaced fracture of phalanx of right index finger, unspecified phalanx, initial encounter   Atopic Dermatitis: Patient requesting for steroid injection; 125 mg solumedrol given. Send home with topical steroid to apply topically (see below). Apply moisturizer frequently. Applying Aquaphor will help as well. Avoid any known triggers.   Finger pain: Xray shows slightly displaced fracture of the base of the middle phalanx of the right second digit. Aluminum finger splint applied. Take ibuprofen for pain relief. F/u with Orthopedic next week.  ED Discharge Orders    None     Controlled Substance Prescriptions [place] Controlled Substance Registry consulted? Not Applicable   Barry Dienes, NP 05/10/17 1139

## 2017-05-12 ENCOUNTER — Ambulatory Visit (INDEPENDENT_AMBULATORY_CARE_PROVIDER_SITE_OTHER): Payer: Self-pay | Admitting: Family

## 2017-05-12 ENCOUNTER — Encounter (INDEPENDENT_AMBULATORY_CARE_PROVIDER_SITE_OTHER): Payer: Self-pay | Admitting: Family

## 2017-05-12 VITALS — Ht 69.0 in | Wt 160.0 lb

## 2017-05-12 DIAGNOSIS — S62600G Fracture of unspecified phalanx of right index finger, subsequent encounter for fracture with delayed healing: Secondary | ICD-10-CM
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
## 2017-05-26 ENCOUNTER — Ambulatory Visit (INDEPENDENT_AMBULATORY_CARE_PROVIDER_SITE_OTHER): Payer: Self-pay | Admitting: Family

## 2017-05-30 ENCOUNTER — Encounter (INDEPENDENT_AMBULATORY_CARE_PROVIDER_SITE_OTHER): Payer: Self-pay | Admitting: Family

## 2017-05-30 ENCOUNTER — Ambulatory Visit (INDEPENDENT_AMBULATORY_CARE_PROVIDER_SITE_OTHER): Payer: Self-pay

## 2017-05-30 ENCOUNTER — Ambulatory Visit (INDEPENDENT_AMBULATORY_CARE_PROVIDER_SITE_OTHER): Payer: Self-pay | Admitting: Family

## 2017-05-30 VITALS — Ht 69.0 in | Wt 160.0 lb

## 2017-05-30 DIAGNOSIS — M79644 Pain in right finger(s): Secondary | ICD-10-CM

## 2017-05-30 NOTE — Progress Notes (Signed)
Office Visit Note   Patient: Gina Ross           Date of Birth: 1970/12/07           MRN: [id number] Visit Date: 05/30/2017              Requested by: No referring provider defined for this encounter. PCP: Patient, [name]  Chief Complaint  Patient presents with  . Right Hand - Follow-up    DOI 05/10/17 right closed displaced fx of phalanx index finger       HPI: The patient is a 46 year old woman seen today in follow up for slightly displaced fracture of the base of the middle phalanx of her second finger. She has been in an aluminum splint which is held in place by Coban.   Complains of stiffness and pain.   Assessment & Plan: Visit Diagnoses:  1. Finger pain, right     Plan: May discontinue splint. Follow up in office in as needed. Work on ROM.  Follow-Up Instructions: No Follow-up on file.   Right Hand Exam   Range of Motion  Hand  PIP Index: normal   Other  Erythema: absent  Comments:  PIP of right index stiff, tender and swollen, cap refil < 2      Patient is alert, oriented, no adenopathy, well-dressed, normal affect, normal respiratory effort. Minimal swelling to right second finger. No breakdown of skin.   Imaging: No results found. No images are attached to the encounter.  Labs: Lab Results  Component Value Date   REPTSTATUS 04/14/2016 FINAL 04/12/2016   GRAMSTAIN  11/01/2008    RARE WBC PRESENT, PREDOMINANTLY PMN NO SQUAMOUS EPITHELIAL CELLS SEEN RARE GRAM POSITIVE COCCI IN PAIRS   CULT NO GROUP A STREP (S.PYOGENES) ISOLATED 04/12/2016    Orders:  Orders Placed This Encounter  Procedures  . XR Finger Index Right   No orders of the defined types were placed in this encounter.    Procedures: No procedures performed  Clinical Data: No additional findings.  ROS:  All other systems negative, except as noted in the HPI. Review of Systems  Constitutional: Negative for chills and fever.  Musculoskeletal: Positive for  arthralgias.  Skin: Negative for color change.    Objective: Vital Signs: Ht 5\' 9"  (1.753 m)   Wt 160 lb (72.6 kg)   BMI 23.63 kg/m   Specialty Comments:  No specialty comments available.  PMFS History: Patient Active Problem List   Diagnosis Date Noted  . Unilateral primary osteoarthritis, right knee 01/31/2017   Past Medical History:  Diagnosis Date  . Asthma    as a child    Family History  Problem Relation Age of Onset  . Hypertension Mother   . Diabetes Mother   . Hypertension Father     Past Surgical History:  Procedure Laterality Date  . COLONOSCOPY    . HARDWARE REMOVAL Right 09/11/2014   Procedure: Removal Deep Hardware Right Knee;  Surgeon: Newt Minion, MD;  Location: [place];  Service: Orthopedics;  Laterality: Right;  . ORIF PATELLA Right 03/22/2014   Procedure: Open Reduction Internal Fixation Right Patella ;  Surgeon: Newt Minion, MD;  Location: [place];  Service: Orthopedics;  Laterality: Right;   Social History   Occupational History  . Not on file  Tobacco Use  . Smoking status: Current Every Day Smoker    Packs/day: 0.10    Types: Cigarettes  . Smokeless tobacco: Never Used  Substance and Sexual Activity  . Alcohol use: Yes    Comment: socailly  . Drug use: No  . Sexual activity: Yes    Birth control/protection: None

## 2017-09-12 ENCOUNTER — Telehealth (INDEPENDENT_AMBULATORY_CARE_PROVIDER_SITE_OTHER): Payer: Self-pay | Admitting: Orthopedic Surgery

## 2017-09-12 NOTE — Telephone Encounter (Signed)
Patient called wanting to find out what the name of the injection she received in her right knee.  She is not talking about the cortisone she is talking about the gel injection.  CB#[phone].  Thank you.

## 2017-09-13 NOTE — Telephone Encounter (Signed)
Called pt to advise that she received a monovisc injection 02/16/16

## 2017-09-27 ENCOUNTER — Ambulatory Visit (INDEPENDENT_AMBULATORY_CARE_PROVIDER_SITE_OTHER): Payer: Self-pay | Admitting: Orthopedic Surgery

## 2017-11-27 ENCOUNTER — Ambulatory Visit (HOSPITAL_COMMUNITY)
Admission: EM | Admit: 2017-11-27 | Discharge: 2017-11-27 | Disposition: A | Discharge status: Home / Self Care | Payer: Medicaid Other | Attending: Family Medicine | Admitting: Family Medicine

## 2017-11-27 ENCOUNTER — Encounter (HOSPITAL_COMMUNITY): Payer: Self-pay | Admitting: Family Medicine

## 2017-11-27 DIAGNOSIS — L03111 Cellulitis of right axilla: Secondary | ICD-10-CM

## 2017-11-27 DIAGNOSIS — L309 Dermatitis, unspecified: Secondary | ICD-10-CM

## 2017-11-27 MED ORDER — DOXYCYCLINE HYCLATE 100 MG PO TABS
ORAL_TABLET | ORAL | Status: AC
  Filled 2017-11-27: qty 1

## 2017-11-27 MED ORDER — DOXYCYCLINE HYCLATE 100 MG PO TABS
100.0000 mg | ORAL_TABLET | Freq: Once | ORAL | Status: AC
  Administered 2017-11-27: 100 mg via ORAL

## 2017-11-27 MED ORDER — SULFAMETHOXAZOLE-TRIMETHOPRIM 800-160 MG PO TABS: 1.0000 | ORAL_TABLET | Freq: Two times a day (BID) | ORAL | 0 refills | Status: AC

## 2017-11-27 MED ORDER — METHYLPREDNISOLONE ACETATE 80 MG/ML IJ SUSP
INTRAMUSCULAR | Status: AC
  Filled 2017-11-27: qty 1

## 2017-11-27 MED ORDER — METHYLPREDNISOLONE ACETATE 80 MG/ML IJ SUSP
80.0000 mg | Freq: Once | INTRAMUSCULAR | Status: AC
  Administered 2017-11-27: 80 mg via INTRAMUSCULAR

## 2017-11-27 NOTE — ED Triage Notes (Signed)
Pt with hx of eczema here for rash under her right axilla. She sts oozing and itching.

## 2017-11-27 NOTE — Discharge Instructions (Signed)
We gave you a shot for the eczema  This will reduce the pain and inflammation form the rash Take the antibiotic for a week This will take down the infection I expect improvement in a couple of days

## 2017-11-27 NOTE — ED Provider Notes (Signed)
[facility]    CSN: [id number] Arrival date & time: 11/27/17  1210     History   Chief Complaint Chief Complaint  Patient presents with  . Rash    HPI Gina Ross is a 47 y.o. female.   HPI  Patient with known eczema.  She states that usually is on the insides of her elbows and behind her knees.  At times it breaks out in other places.  Currently she has a rash in both axilla.  The right side is swollen, painful, and draining.  It has multiple pustules.  She feels like it is infected.  The rash on her elbows is also inflamed and bothering her.  No known exposure to new soap lotion powder or product.  She is not currently treating the rash with any cream or lotion.  The importance of emollients regularly is discussed  Past Medical History:  Diagnosis Date  . Asthma    as a child    Patient Active Problem List   Diagnosis Date Noted  . Unilateral primary osteoarthritis, right knee 01/31/2017    Past Surgical History:  Procedure Laterality Date  . COLONOSCOPY    . HARDWARE REMOVAL Right 09/11/2014   Procedure: Removal Deep Hardware Right Knee;  Surgeon: Newt Minion, MD;  Location: [facility];  Service: Orthopedics;  Laterality: Right;  . ORIF PATELLA Right 03/22/2014   Procedure: Open Reduction Internal Fixation Right Patella ;  Surgeon: Newt Minion, MD;  Location: [facility];  Service: Orthopedics;  Laterality: Right;    OB History   None      Home Medications    Prior to Admission medications   Medication Sig Start Date End Date Taking? Authorizing Provider  ibuprofen (ADVIL,MOTRIN) 800 MG tablet Take 1 tablet (800 mg total) 3 (three) times daily by mouth. 05/10/17   Barry Dienes, NP  sulfamethoxazole-trimethoprim (BACTRIM DS,SEPTRA DS) 800-160 MG tablet Take 1 tablet by mouth 2 (two) times daily for 7 days. 11/27/17 12/04/17  Raylene Everts, MD    Family History Family History  Problem Relation Age of Onset  . Hypertension Mother   . Diabetes Mother     . Hypertension Father     Social History Social History   Tobacco Use  . Smoking status: Current Every Day Smoker    Packs/day: 0.10    Types: Cigarettes  . Smokeless tobacco: Never Used  Substance Use Topics  . Alcohol use: Yes    Comment: socailly  . Drug use: No     Allergies   Penicillins   Review of Systems Review of Systems  Constitutional: Negative for chills and fever.  HENT: Negative for ear pain and sore throat.   Eyes: Negative for pain and visual disturbance.  Respiratory: Negative for cough and shortness of breath.   Cardiovascular: Negative for chest pain and palpitations.  Gastrointestinal: Negative for abdominal pain and vomiting.  Genitourinary: Negative for dysuria and hematuria.  Musculoskeletal: Negative for arthralgias and back pain.  Skin: Positive for rash. Negative for color change.  Neurological: Negative for seizures and syncope.  All other systems reviewed and are negative.    Physical Exam Triage Vital Signs ED Triage Vitals  Enc Vitals Group     BP 11/27/17 1242 (!) 130/97     Pulse Rate 11/27/17 1242 90     Resp 11/27/17 1242 18     Temp 11/27/17 1242 98.7 F (37.1 C)     Temp Source 11/27/17 1242 Oral  SpO2 11/27/17 1242 100 %     Weight --      Height --      Head Circumference --      Peak Flow --      Pain Score 11/27/17 1241 2     Pain Loc --      Pain Edu? --      Excl. in [place]? --    No data found.  Updated Vital Signs BP (!) 130/97   Pulse 90   Temp 98.7 F (37.1 C) (Oral)   Resp 18   LMP 10/29/2017   SpO2 100%   Visual Acuity Right Eye Distance:   Left Eye Distance:   Bilateral Distance:    Right Eye Near:   Left Eye Near:    Bilateral Near:     Physical Exam  Constitutional: She appears well-developed and well-nourished. No distress.  HENT:  Head: Normocephalic and atraumatic.  Mouth/Throat: Oropharynx is clear and moist.  Eyes: Pupils are equal, round, and reactive to light. Conjunctivae are  normal.  Neck: Normal range of motion.  Cardiovascular: Normal rate.  Pulmonary/Chest: Effort normal. No respiratory distress.  Abdominal: Soft. She exhibits no distension.  Musculoskeletal: Normal range of motion. She exhibits no edema.  Neurological: She is alert.  Skin: Skin is warm and dry. Rash noted.  Flexor creases both elbows have hyperpigmentation, erythema, multiple papular areas.  No vesicles.  The right axilla has multiple pustules, weeping at patches with surrounding erythema.  Tender.  No adenopathy     UC Treatments / Results  Labs (all labs ordered are listed, but only abnormal results are displayed) Labs Reviewed - No data to display  EKG None  Radiology No results found.  Procedures Procedures (including critical care time)  Medications Ordered in UC Medications  doxycycline (VIBRA-TABS) tablet 100 mg (100 mg Oral Given 11/27/17 1307)  methylPREDNISolone acetate (DEPO-MEDROL) injection 80 mg (80 mg Intramuscular Given 11/27/17 1330)    Initial Impression / Assessment and Plan / UC Course  I have reviewed the triage vital signs and the nursing notes.  Pertinent labs & imaging results that were available during my care of the patient were reviewed by me and considered in my medical decision making (see chart for details).     Discussed that she has underlying eczema.  This needs to be treated with tepid baths, frequent lotions, occasional cortisone creams.  Avoiding any scented products. She has superficial cellulitis in addition. Final Clinical Impressions(s) / UC Diagnoses   Final diagnoses:  Eczema of both upper extremities  Cellulitis of right axilla     Discharge Instructions     We gave you a shot for the eczema  This will reduce the pain and inflammation form the rash Take the antibiotic for a week This will take down the infection I expect improvement in a couple of days   ED Prescriptions    Medication Sig Dispense Auth. Provider    sulfamethoxazole-trimethoprim (BACTRIM DS,SEPTRA DS) 800-160 MG tablet Take 1 tablet by mouth 2 (two) times daily for 7 days. 14 tablet Raylene Everts, MD     Controlled Substance Prescriptions [place] Controlled Substance Registry consulted? No   Raylene Everts, MD 11/27/17 [phone]

## 2018-05-09 ENCOUNTER — Telehealth (INDEPENDENT_AMBULATORY_CARE_PROVIDER_SITE_OTHER): Payer: Self-pay | Admitting: Orthopedic Surgery

## 2018-05-09 NOTE — Telephone Encounter (Signed)
Ok reorder gel injection

## 2018-05-09 NOTE — Telephone Encounter (Signed)
Ok to reorder gel injection for right knee? Last injection 02/16/16

## 2018-05-09 NOTE — Telephone Encounter (Signed)
Patient requesting gel injection.  Please call patient to advise [phone]

## 2018-05-10 NOTE — Telephone Encounter (Signed)
Noted  

## 2018-05-10 NOTE — Telephone Encounter (Signed)
See message below. Can we order gel injection for right knee OA for this pt.

## 2018-05-29 ENCOUNTER — Encounter (HOSPITAL_COMMUNITY): Payer: Self-pay | Admitting: Emergency Medicine

## 2018-05-29 ENCOUNTER — Ambulatory Visit (HOSPITAL_COMMUNITY)
Admission: EM | Admit: 2018-05-29 | Discharge: 2018-05-29 | Disposition: A | Discharge status: Home / Self Care | Payer: Self-pay | Attending: Family Medicine | Admitting: Family Medicine

## 2018-05-29 ENCOUNTER — Ambulatory Visit (INDEPENDENT_AMBULATORY_CARE_PROVIDER_SITE_OTHER): Payer: Self-pay

## 2018-05-29 DIAGNOSIS — S90121A Contusion of right lesser toe(s) without damage to nail, initial encounter: Secondary | ICD-10-CM

## 2018-05-29 DIAGNOSIS — S8391XA Sprain of unspecified site of right knee, initial encounter: Secondary | ICD-10-CM

## 2018-05-29 DIAGNOSIS — W19XXXA Unspecified fall, initial encounter: Secondary | ICD-10-CM

## 2018-05-29 DIAGNOSIS — W2209XA Striking against other stationary object, initial encounter: Secondary | ICD-10-CM

## 2018-05-29 MED ORDER — IBUPROFEN 800 MG PO TABS: 800.0000 mg | ORAL_TABLET | Freq: Three times a day (TID) | ORAL | 0 refills | Status: DC

## 2018-05-29 NOTE — ED Triage Notes (Signed)
Pt sts right knee pain from trip and fall yesterday; pt sts right pinky toe pain after kicking wall

## 2018-05-29 NOTE — ED Provider Notes (Signed)
[facility]    CSN: [id number] Arrival date & time: 05/29/18  1146     History   Chief Complaint Chief Complaint  Patient presents with  . Foot Pain  . Knee Pain    HPI Gina Ross is a 47 y.o. female.   HPI  Patient states that she has a bad knee on the right.  She is had fractures.  She is had surgeries.  She had trouble with it for years.  She states she has significant arthritis and is going to need a knee replacement.  Yesterday she fell on her flexed knee.  Her knee is very painful today.  She states that when she stood up she was very uncomfortable, very angry, and kicked the wall.  She injured her fifth toe when she kicked the wall.  She is here to have both of them looked at.  Past Medical History:  Diagnosis Date  . Asthma    as a child    Patient Active Problem List   Diagnosis Date Noted  . Unilateral primary osteoarthritis, right knee 01/31/2017    Past Surgical History:  Procedure Laterality Date  . COLONOSCOPY    . HARDWARE REMOVAL Right 09/11/2014   Procedure: Removal Deep Hardware Right Knee;  Surgeon: Newt Minion, MD;  Location: [facility];  Service: Orthopedics;  Laterality: Right;  . ORIF PATELLA Right 03/22/2014   Procedure: Open Reduction Internal Fixation Right Patella ;  Surgeon: Newt Minion, MD;  Location: [facility] [place];  Service: Orthopedics;  Laterality: Right;    OB History   None      Home Medications    Prior to Admission medications   Medication Sig Start Date End Date Taking? Authorizing Provider  ibuprofen (ADVIL,MOTRIN) 800 MG tablet Take 1 tablet (800 mg total) by mouth 3 (three) times daily. 05/29/18   Raylene Everts, MD    Family History Family History  Problem Relation Age of Onset  . Hypertension Mother   . Diabetes Mother   . Hypertension Father     Social History Social History   Tobacco Use  . Smoking status: Current Every Day Smoker    Packs/day: 0.10    Types: Cigarettes  . Smokeless tobacco:  Never Used  Substance Use Topics  . Alcohol use: Yes    Comment: socailly  . Drug use: No     Allergies   Penicillins   Review of Systems Review of Systems  Constitutional: Negative for chills and fever.  HENT: Negative for ear pain and sore throat.   Eyes: Negative for pain and visual disturbance.  Respiratory: Negative for cough and shortness of breath.   Cardiovascular: Negative for chest pain and palpitations.  Gastrointestinal: Negative for abdominal pain and vomiting.  Genitourinary: Negative for dysuria and hematuria.  Musculoskeletal: Positive for arthralgias and gait problem. Negative for back pain.  Skin: Negative for color change and rash.  Neurological: Negative for seizures and syncope.  Psychiatric/Behavioral: Positive for behavioral problems.  All other systems reviewed and are negative.    Physical Exam Triage Vital Signs ED Triage Vitals  Enc Vitals Group     BP 05/29/18 1249 (!) 142/93     Pulse Rate 05/29/18 1249 86     Resp 05/29/18 1249 18     Temp 05/29/18 1249 98.5 F (36.9 C)     Temp Source 05/29/18 1249 Oral     SpO2 05/29/18 1249 100 %     Weight --  Height --      Head Circumference --      Peak Flow --      Pain Score 05/29/18 1250 8     Pain Loc --      Pain Edu? --      Excl. in [place]? --    No data found.  Updated Vital Signs BP (!) 142/93 (BP Location: Right Arm)   Pulse 86   Temp 98.5 F (36.9 C) (Oral)   Resp 18   LMP 05/01/2018 (Exact Date)   SpO2 100%   Visual Acuity Right Eye Distance:   Left Eye Distance:   Bilateral Distance:    Right Eye Near:   Left Eye Near:    Bilateral Near:     Physical Exam  Constitutional: She appears well-developed and well-nourished. No distress.  HENT:  Head: Normocephalic and atraumatic.  Mouth/Throat: Oropharynx is clear and moist.  Eyes: Pupils are equal, round, and reactive to light. Conjunctivae are normal.  Neck: Normal range of motion.  Cardiovascular: Normal rate.    Pulmonary/Chest: Effort normal. No respiratory distress.  Abdominal: Soft. She exhibits no distension.  Musculoskeletal: Normal range of motion. She exhibits no edema.  The right knee is examined.  There is no bruising.  There is tenderness diffusely.  Mostly concentrated over the patella.  She has full but slow range of motion.  No instability.  The right foot is examined.  She has some swelling of the fifth toe.  No deformity.  She resists full palpation.  Neurological: She is alert.  Skin: Skin is warm and dry.  Psychiatric: She has a normal mood and affect. Her behavior is normal.     UC Treatments / Results  Labs (all labs ordered are listed, but only abnormal results are displayed) Labs Reviewed - No data to display  EKG None  Radiology Dg Foot Complete Right  Result Date: 05/29/2018 CLINICAL DATA:  47 y/o F; fall downstairs and kicked a wall. Pain of the fifth digit and metatarsal. EXAM: RIGHT FOOT COMPLETE - 3+ VIEW COMPARISON:  None. FINDINGS: There is no evidence of fracture or dislocation. There is no evidence of arthropathy or other focal bone abnormality. Soft tissues are unremarkable. IMPRESSION: Negative. Electronically Signed   By: Kristine Garbe M.D.   On: 05/29/2018 13:59   Dg Knee Ap/lat W/sunrise Right  Result Date: 05/29/2018 CLINICAL DATA:  [place] yesterday. Pain in right knee on anterior aspect of knee on patella. Pt sts she usually has knee pain due to diminished joint space.Previous FX and surgery to RT patella. Pt sts patella broke into 3 pieces x 3 years ago. EXAM: RIGHT KNEE 3 VIEWS COMPARISON:  None. FINDINGS: No acute fracture. There is a step-off along the dorsal margin of the patella from the previous transverse patellar fracture. Fracture is healed. Joints are normally spaced and aligned.  No bone lesions. No joint effusion.  Soft tissues are unremarkable. IMPRESSION: No acute fracture or dislocation. Electronically Signed   By: Lajean Manes M.D.    On: 05/29/2018 13:57    Procedures Procedures (including critical care time)  Medications Ordered in UC Medications - No data to display  Initial Impression / Assessment and Plan / UC Course  I have reviewed the triage vital signs and the nursing notes.  Pertinent labs & imaging results that were available during my care of the patient were reviewed by me and considered in my medical decision making (see chart for details).  No fractures.  Discussed conservative care.  Follow-up with your orthopedist Final Clinical Impressions(s) / UC Diagnoses   Final diagnoses:  Contusion of fifth toe of right foot, initial encounter  Sprain of right knee, unspecified ligament, initial encounter     Discharge Instructions     See Dr Sharol Given for follow up Wear brace/shoe as needed for comfort Decrease activity / walking while leg painful Use ice to reduce pain and swelling   ED Prescriptions    Medication Sig Dispense Auth. Provider   ibuprofen (ADVIL,MOTRIN) 800 MG tablet Take 1 tablet (800 mg total) by mouth 3 (three) times daily. 21 tablet Raylene Everts, MD     Controlled Substance Prescriptions [place] Controlled Substance Registry consulted? Not Applicable   Raylene Everts, MD 05/29/18 2123

## 2018-05-29 NOTE — Discharge Instructions (Addendum)
See Dr Sharol Given for follow up Wear brace/shoe as needed for comfort Decrease activity / walking while leg painful Use ice to reduce pain and swelling

## 2018-11-21 ENCOUNTER — Other Ambulatory Visit: Payer: Self-pay

## 2018-11-21 ENCOUNTER — Ambulatory Visit (INDEPENDENT_AMBULATORY_CARE_PROVIDER_SITE_OTHER): Payer: Self-pay

## 2018-11-21 ENCOUNTER — Ambulatory Visit (HOSPITAL_COMMUNITY)
Admission: EM | Admit: 2018-11-21 | Discharge: 2018-11-21 | Disposition: A | Discharge status: Home / Self Care | Payer: Self-pay | Attending: Family Medicine | Admitting: Family Medicine

## 2018-11-21 ENCOUNTER — Encounter (HOSPITAL_COMMUNITY): Payer: Self-pay | Admitting: Emergency Medicine

## 2018-11-21 DIAGNOSIS — S90121A Contusion of right lesser toe(s) without damage to nail, initial encounter: Secondary | ICD-10-CM

## 2018-11-21 DIAGNOSIS — W2209XA Striking against other stationary object, initial encounter: Secondary | ICD-10-CM

## 2018-11-21 DIAGNOSIS — S99921A Unspecified injury of right foot, initial encounter: Secondary | ICD-10-CM

## 2018-11-21 MED ORDER — IBUPROFEN 800 MG PO TABS: 800.0000 mg | ORAL_TABLET | Freq: Three times a day (TID) | ORAL | 0 refills | Status: DC

## 2018-11-21 NOTE — Discharge Instructions (Signed)
No fracture Use anti-inflammatories for pain/swelling. You may take up to 800 mg Ibuprofen every 8 hours with food. You may supplement Ibuprofen with Tylenol [phone] mg every 8 hours.  Ice and elevate May buddy tape toe to keep pinky supported.   Follow up if not getting better

## 2018-11-21 NOTE — ED Triage Notes (Signed)
Right foot pain after, catching little toe on a metal frame to bed last night.  Right foot with 2+ pulse.  Patient has pain behind right little toe and along edge of foot.

## 2018-11-21 NOTE — ED Provider Notes (Signed)
[place]    CSN: [id number] Arrival date & time: 11/21/18  0831     History   Chief Complaint Chief Complaint  Patient presents with  . Foot Pain    HPI Gina Ross is a 48 y.o. female no contributing past medical history presenting today for evaluation of right foot injury.  Patient injured her right foot last night on a metal bed frame.  She caught her little toe and since has had pain to her little toe and is extended along the lateral base of her foot.  Has had pain with weightbearing since.  Patient had similar injury in December.  States that she felt a pop after it happened.  HPI  Past Medical History:  Diagnosis Date  . Asthma    as a child    Patient Active Problem List   Diagnosis Date Noted  . Unilateral primary osteoarthritis, right knee 01/31/2017    Past Surgical History:  Procedure Laterality Date  . COLONOSCOPY    . HARDWARE REMOVAL Right 09/11/2014   Procedure: Removal Deep Hardware Right Knee;  Surgeon: Newt Minion, MD;  Location: [place];  Service: Orthopedics;  Laterality: Right;  . ORIF PATELLA Right 03/22/2014   Procedure: Open Reduction Internal Fixation Right Patella ;  Surgeon: Newt Minion, MD;  Location: [facility];  Service: Orthopedics;  Laterality: Right;    OB History   No obstetric history on file.      Home Medications    Prior to Admission medications   Medication Sig Start Date End Date Taking? Authorizing Provider  acetaminophen (TYLENOL) 325 MG tablet Take 650 mg by mouth every 6 (six) hours as needed.   Yes [provider]  ibuprofen (ADVIL) 800 MG tablet Take 1 tablet (800 mg total) by mouth 3 (three) times daily. 11/21/18   Wieters, Elesa Hacker, PA-C    Family History Family History  Problem Relation Age of Onset  . Hypertension Mother   . Diabetes Mother   . Hypertension Father     Social History Social History   Tobacco Use  . Smoking status: Current Every Day Smoker    Packs/day: 0.10   Types: Cigarettes  . Smokeless tobacco: Never Used  Substance Use Topics  . Alcohol use: Yes    Comment: socailly  . Drug use: No     Allergies   Penicillins   Review of Systems Review of Systems  Constitutional: Negative for fatigue and fever.  Eyes: Negative for visual disturbance.  Respiratory: Negative for shortness of breath.   Cardiovascular: Negative for chest pain.  Gastrointestinal: Negative for abdominal pain, nausea and vomiting.  Musculoskeletal: Positive for arthralgias, gait problem and joint swelling.  Skin: Negative for color change, rash and wound.  Neurological: Negative for dizziness, weakness, light-headedness and headaches.     Physical Exam Triage Vital Signs ED Triage Vitals  Enc Vitals Group     BP 11/21/18 0855 (!) 135/99     Pulse Rate 11/21/18 0855 93     Resp 11/21/18 0855 18     Temp 11/21/18 0855 98.6 F (37 C)     Temp Source 11/21/18 0855 Oral     SpO2 11/21/18 0855 100 %     Weight --      Height --      Head Circumference --      Peak Flow --      Pain Score 11/21/18 0852 10     Pain Loc --  Pain Edu? --      Excl. in [place]? --    No data found.  Updated Vital Signs BP (!) 135/99 (BP Location: Left Arm)   Pulse 93   Temp 98.6 F (37 C) (Oral)   Resp 18   LMP 10/27/2018   SpO2 100%   Visual Acuity Right Eye Distance:   Left Eye Distance:   Bilateral Distance:    Right Eye Near:   Left Eye Near:    Bilateral Near:     Physical Exam Vitals signs and nursing note reviewed.  Constitutional:      Appearance: She is well-developed.     Comments: No acute distress  HENT:     Head: Normocephalic and atraumatic.     Nose: Nose normal.  Eyes:     Conjunctiva/sclera: Conjunctivae normal.  Neck:     Musculoskeletal: Neck supple.  Cardiovascular:     Rate and Rhythm: Normal rate.  Pulmonary:     Effort: Pulmonary effort is normal. No respiratory distress.  Abdominal:     General: There is no distension.   Musculoskeletal: Normal range of motion.     Comments: Right foot: No obvious deformity or swelling, small bruising to lateral aspect of the fifth toe.  Nontender along medial lateral malleolus, nontender throughout first through third metatarsal, tenderness begins along fourth metatarsal increase is going over fifth and to little toe.  Sensation intact distally Dorsalis pedis 2+  Skin:    General: Skin is warm and dry.  Neurological:     Mental Status: She is alert and oriented to person, place, and time.      UC Treatments / Results  Labs (all labs ordered are listed, but only abnormal results are displayed) Labs Reviewed - No data to display  EKG None  Radiology Dg Foot Complete Right  Result Date: 11/21/2018 CLINICAL DATA:  Right foot injury.  Pain in 5th digit. EXAM: RIGHT FOOT COMPLETE - 3+ VIEW COMPARISON:  02/21/2017 FINDINGS: There is no evidence of fracture or dislocation. There is no evidence of arthropathy or other focal bone abnormality. Soft tissues are unremarkable. IMPRESSION: Negative. Electronically Signed   By: Rolm Baptise M.D.   On: 11/21/2018 09:05    Procedures Procedures (including critical care time)  Medications Ordered in UC Medications - No data to display  Initial Impression / Assessment and Plan / UC Course  I have reviewed the triage vital signs and the nursing notes.  Pertinent labs & imaging results that were available during my care of the patient were reviewed by me and considered in my medical decision making (see chart for details).     No fracture noted on x-ray.  Most likely contusion.  Will treat with anti-inflammatories, ice and elevation.  Buddy tape as healing.Discussed strict return precautions. Patient verbalized understanding and is agreeable with plan.  Final Clinical Impressions(s) / UC Diagnoses   Final diagnoses:  Contusion of lesser toe of right foot without damage to nail, initial encounter     Discharge  Instructions     No fracture Use anti-inflammatories for pain/swelling. You may take up to 800 mg Ibuprofen every 8 hours with food. You may supplement Ibuprofen with Tylenol [phone] mg every 8 hours.  Ice and elevate May buddy tape toe to keep pinky supported.   Follow up if not getting better    ED Prescriptions    Medication Sig Dispense Auth. Provider   ibuprofen (ADVIL) 800 MG tablet Take 1 tablet (800 mg  total) by mouth 3 (three) times daily. 21 tablet Wieters, Melvin C, PA-C     Controlled Substance Prescriptions [place] Controlled Substance Registry consulted? Not Applicable   Janith Lima, [place] 11/21/18 4431

## 2019-02-19 ENCOUNTER — Other Ambulatory Visit: Payer: Self-pay | Admitting: *Deleted

## 2019-02-19 ENCOUNTER — Telehealth: Payer: Self-pay | Admitting: Orthopedic Surgery

## 2019-02-19 DIAGNOSIS — R5381 Other malaise: Secondary | ICD-10-CM

## 2019-02-19 NOTE — Telephone Encounter (Signed)
Patient called and would like to get a gel injection in her knee.  CB#[phone].  Thank you

## 2019-02-19 NOTE — Telephone Encounter (Signed)
Hey Gina Ross,  We was supposed to order this patient a gel injection before, Can we do that now? Thank you

## 2019-02-21 ENCOUNTER — Ambulatory Visit: Payer: Medicaid Other | Admitting: Family

## 2019-02-23 NOTE — Telephone Encounter (Signed)
Noted, will mail patient a [employer] application, due to patient having Medicaid.

## 2019-02-23 NOTE — Telephone Encounter (Signed)
Patient was mailed a [employer] application in December, 2019, but I never received the application back form the patient.  Patient will need to complete application and return it to the office.

## 2019-02-27 ENCOUNTER — Telehealth: Payer: Self-pay

## 2019-02-27 NOTE — Telephone Encounter (Signed)
Mailed [employer] application for patient to complete and return to the office for Monovisc, right knee.  Mailed to address listed in patient's chart.

## 2019-02-28 NOTE — Telephone Encounter (Signed)
Patient was called and informed that a packet was sent out to her and she should fill it out and mail it back. I also informed patient that the injection has to be approved through her insurance and upon approval she will be notified for acceptance and scheduled for an appointment.

## 2019-05-22 ENCOUNTER — Ambulatory Visit: Payer: Medicaid Other | Admitting: Physician Assistant

## 2019-06-14 ENCOUNTER — Ambulatory Visit (INDEPENDENT_AMBULATORY_CARE_PROVIDER_SITE_OTHER): Payer: Self-pay | Admitting: Orthopedic Surgery

## 2019-06-14 ENCOUNTER — Ambulatory Visit (INDEPENDENT_AMBULATORY_CARE_PROVIDER_SITE_OTHER): Payer: Self-pay

## 2019-06-14 ENCOUNTER — Other Ambulatory Visit: Payer: Self-pay

## 2019-06-14 ENCOUNTER — Encounter: Payer: Self-pay | Admitting: Orthopedic Surgery

## 2019-06-14 VITALS — Ht 69.0 in | Wt 160.0 lb

## 2019-06-14 DIAGNOSIS — M1711 Unilateral primary osteoarthritis, right knee: Secondary | ICD-10-CM

## 2019-06-15 DIAGNOSIS — M1711 Unilateral primary osteoarthritis, right knee: Secondary | ICD-10-CM

## 2019-06-15 MED ORDER — LIDOCAINE HCL (PF) 1 % IJ SOLN
5.0000 mL | INTRAMUSCULAR | Status: AC | PRN
  Administered 2019-06-15: 5 mL

## 2019-06-15 MED ORDER — METHYLPREDNISOLONE ACETATE 40 MG/ML IJ SUSP
40.0000 mg | INTRAMUSCULAR | Status: AC | PRN
  Administered 2019-06-15: 40 mg via INTRA_ARTICULAR

## 2019-06-15 NOTE — Progress Notes (Signed)
Office Visit Note   Patient: Gina Ross           Date of Birth: 1970/12/03           MRN: [id number] Visit Date: 06/14/2019              Requested by: No referring provider defined for this encounter. PCP: Patient, [name]  Chief Complaint  Patient presents with  . Right Knee - Pain      HPI: Patient is a 48 year old woman with osteoarthritis of her right knee.  Patient's pain are primarily in the patellofemoral joint she states she has throbbing unbearable pain in the patellofemoral joint.  Patient states she is interested in a hyaluronic acid injection.  Patient states that recently she missed a step and landed on her right knee.  Assessment & Plan: Visit Diagnoses:  1. Unilateral primary osteoarthritis, right knee     Plan: Right knee was injected she tolerated this well we will evaluate for possible hyaluronic acid injection in the future.  Follow-Up Instructions: Return if symptoms worsen or fail to improve.   Ortho Exam  Patient is alert, oriented, no adenopathy, well-dressed, normal affect, normal respiratory effort. Examination patient has an antalgic gait.  She has crepitation and pain to palpation in the patellofemoral joint there is no subluxation or dislocation of the patella.  Medial lateral joint lines are nontender to palpation collaterals and cruciates are stable.  Imaging: No results found. No images are attached to the encounter.  Labs: Lab Results  Component Value Date   REPTSTATUS 04/14/2016 FINAL 04/12/2016   GRAMSTAIN  11/01/2008    RARE WBC PRESENT, PREDOMINANTLY PMN NO SQUAMOUS EPITHELIAL CELLS SEEN RARE GRAM POSITIVE COCCI IN PAIRS   CULT NO GROUP A STREP (S.PYOGENES) ISOLATED 04/12/2016     Lab Results  Component Value Date   ALBUMIN 4.1 09/03/2014   ALBUMIN 3.8 03/22/2014    No results found for: MG No results found for: VD25OH  No results found for: PREALBUMIN CBC EXTENDED Latest Ref Rng & Units 09/03/2014 03/22/2014  12/11/2008  WBC 4.0 - 10.5 K/uL 4.7 5.5 3.7(L)  RBC 3.87 - 5.11 MIL/uL 4.38 4.21 4.10  HGB 12.0 - 15.0 g/dL 13.9 13.1 13.6  HCT 36.0 - 46.0 % 40.5 37.5 38.7  PLT 150 - 400 K/uL 240 259 219     Body mass index is 23.63 kg/m.  Orders:  Orders Placed This Encounter  Procedures  . XR Knee 1-2 Views Right   No orders of the defined types were placed in this encounter.    Procedures: Large Joint Inj: R knee on 06/15/2019 4:20 PM Indications: pain and diagnostic evaluation Details: 22 G 1.5 in needle, anteromedial approach  Arthrogram: No  Medications: 5 mL lidocaine (PF) 1 %; 40 mg methylPREDNISolone acetate 40 MG/ML Outcome: tolerated well, no immediate complications Procedure, treatment alternatives, risks and benefits explained, specific risks discussed. Consent was given by the patient. Immediately prior to procedure a time out was called to verify the correct patient, procedure, equipment, support staff and site/side marked as required. Patient was prepped and draped in the usual sterile fashion.      Clinical Data: No additional findings.  ROS:  All other systems negative, except as noted in the HPI. Review of Systems  Objective: Vital Signs: Ht 5\' 9"  (1.753 m)   Wt 160 lb (72.6 kg)   BMI 23.63 kg/m   Specialty Comments:  No specialty comments available.  PMFS History: Patient  Active Problem List   Diagnosis Date Noted  . Unilateral primary osteoarthritis, right knee 01/31/2017   Past Medical History:  Diagnosis Date  . Asthma    as a child    Family History  Problem Relation Age of Onset  . Hypertension Mother   . Diabetes Mother   . Hypertension Father     Past Surgical History:  Procedure Laterality Date  . COLONOSCOPY    . HARDWARE REMOVAL Right 09/11/2014   Procedure: Removal Deep Hardware Right Knee;  Surgeon: Newt Minion, MD;  Location: [facility];  Service: Orthopedics;  Laterality: Right;  . ORIF PATELLA Right 03/22/2014   Procedure: Open  Reduction Internal Fixation Right Patella ;  Surgeon: Newt Minion, MD;  Location: [place];  Service: Orthopedics;  Laterality: Right;   Social History   Occupational History  . Not on file  Tobacco Use  . Smoking status: Current Every Day Smoker    Packs/day: 0.10    Types: Cigarettes  . Smokeless tobacco: Never Used  Substance and Sexual Activity  . Alcohol use: Yes    Comment: socailly  . Drug use: No  . Sexual activity: Yes    Birth control/protection: None

## 2019-07-05 ENCOUNTER — Ambulatory Visit: Payer: Self-pay | Admitting: Orthopedic Surgery

## 2019-07-12 ENCOUNTER — Ambulatory Visit: Payer: Medicaid Other | Admitting: Orthopedic Surgery

## 2019-07-30 DIAGNOSIS — C801 Malignant (primary) neoplasm, unspecified: Secondary | ICD-10-CM

## 2019-07-30 HISTORY — DX: Malignant (primary) neoplasm, unspecified: C80.1

## 2019-07-31 ENCOUNTER — Other Ambulatory Visit: Payer: Self-pay

## 2019-07-31 ENCOUNTER — Encounter (HOSPITAL_COMMUNITY): Payer: Self-pay

## 2019-07-31 ENCOUNTER — Ambulatory Visit (HOSPITAL_COMMUNITY)
Admission: EM | Admit: 2019-07-31 | Discharge: 2019-07-31 | Disposition: A | Discharge status: Home / Self Care | Payer: Medicaid Other | Attending: Family Medicine | Admitting: Family Medicine

## 2019-07-31 ENCOUNTER — Other Ambulatory Visit: Payer: Self-pay | Admitting: Family Medicine

## 2019-07-31 DIAGNOSIS — N61 Mastitis without abscess: Secondary | ICD-10-CM | POA: Insufficient documentation

## 2019-07-31 MED ORDER — IBUPROFEN 800 MG PO TABS: 800.0000 mg | ORAL_TABLET | Freq: Three times a day (TID) | ORAL | 0 refills | Status: DC

## 2019-07-31 MED ORDER — TRAMADOL HCL 50 MG PO TABS: 50.0000 mg | ORAL_TABLET | Freq: Four times a day (QID) | ORAL | 0 refills | Status: DC | PRN

## 2019-07-31 MED ORDER — SULFAMETHOXAZOLE-TRIMETHOPRIM 800-160 MG PO TABS: 1.0000 | ORAL_TABLET | Freq: Two times a day (BID) | ORAL | 0 refills | Status: AC

## 2019-07-31 NOTE — ED Triage Notes (Signed)
Pt c/o lump to right breast and under arm/axilla  since Sunday with pain. States had some yellow discharge from right nipple area. Denies fever, chills, n/v.  Pt tearful and states "I'm scared".

## 2019-07-31 NOTE — Discharge Instructions (Addendum)
Treating you for possible infection.  Take the antibiotics as prescribed.  800 mg of ibuprofen 3 times a day for mild to moderate pain.  Tramadol for severe pain as needed every 6 hours You need to call [phone] to get things going with the ultrasound.  I have already ordered the mammogram and ultrasound.  If symptoms worsen or you are unable to get in to get the ultrasound mammogram in a timely  manner please follow-up

## 2019-08-01 ENCOUNTER — Telehealth (HOSPITAL_COMMUNITY): Payer: Self-pay | Admitting: Family Medicine

## 2019-08-01 ENCOUNTER — Other Ambulatory Visit: Payer: Self-pay | Admitting: Family Medicine

## 2019-08-01 DIAGNOSIS — N6459 Other signs and symptoms in breast: Secondary | ICD-10-CM

## 2019-08-01 DIAGNOSIS — N6310 Unspecified lump in the right breast, unspecified quadrant: Secondary | ICD-10-CM

## 2019-08-01 NOTE — Telephone Encounter (Signed)
Request chang for lab order

## 2019-08-01 NOTE — ED Provider Notes (Signed)
[place]    CSN: [id number] Arrival date & time: 07/31/19  1136      History   Chief Complaint Chief Complaint  Patient presents with  . lump under right arm and breast    HPI Gina Ross is a 49 y.o. female.   Patient is a 49 year old female who presents today with right breast pain, swelling, redness and swelling under the right axilla.  Reporting symptoms have been constant and worsening for the past 3 days.  Noticed the swelling to the axilla about a week or more prior to all of this starting.  She has had some yellow drainage from the right nipple.  Reporting previously was getting regular mammograms.  Unsure of last one.  No associated fever, chills, body aches  or night sweats.  ROS per HPI      Past Medical History:  Diagnosis Date  . Asthma    as a child    Patient Active Problem List   Diagnosis Date Noted  . Unilateral primary osteoarthritis, right knee 01/31/2017    Past Surgical History:  Procedure Laterality Date  . COLONOSCOPY    . HARDWARE REMOVAL Right 09/11/2014   Procedure: Removal Deep Hardware Right Knee;  Surgeon: Newt Minion, MD;  Location: [facility];  Service: Orthopedics;  Laterality: Right;  . ORIF PATELLA Right 03/22/2014   Procedure: Open Reduction Internal Fixation Right Patella ;  Surgeon: Newt Minion, MD;  Location: [facility];  Service: Orthopedics;  Laterality: Right;    OB History   No obstetric history on file.      Home Medications    Prior to Admission medications   Medication Sig Start Date End Date Taking? Authorizing Provider  acetaminophen (TYLENOL) 325 MG tablet Take 650 mg by mouth every 6 (six) hours as needed.    [provider]  ibuprofen (ADVIL) 800 MG tablet Take 1 tablet (800 mg total) by mouth 3 (three) times daily. 07/31/19   Loura Halt A, NP  sulfamethoxazole-trimethoprim (BACTRIM DS) 800-160 MG tablet Take 1 tablet by mouth 2 (two) times daily for 10 days. 07/31/19 08/10/19  Loura Halt A,  NP  traMADol (ULTRAM) 50 MG tablet Take 1 tablet (50 mg total) by mouth every 6 (six) hours as needed. 07/31/19   Orvan July, NP    Family History Family History  Problem Relation Age of Onset  . Hypertension Mother   . Diabetes Mother   . Hypertension Father     Social History Social History   Tobacco Use  . Smoking status: Current Every Day Smoker    Packs/day: 0.10  . Smokeless tobacco: Never Used  . Tobacco comment: marijuana  Substance Use Topics  . Alcohol use: Yes    Comment: socailly  . Drug use: Yes    Types: Marijuana     Allergies   Penicillins   Review of Systems Review of Systems   Physical Exam Triage Vital Signs ED Triage Vitals  Enc Vitals Group     BP 07/31/19 1221 (!) 141/102     Pulse Rate 07/31/19 1221 97     Resp 07/31/19 1221 20     Temp 07/31/19 1221 98.5 F (36.9 C)     Temp Source 07/31/19 1221 Oral     SpO2 07/31/19 1221 98 %     Weight --      Height --      Head Circumference --      Peak Flow --  Pain Score 07/31/19 1218 8     Pain Loc --      Pain Edu? --      Excl. in [place]? --    No data found.  Updated Vital Signs BP (!) 141/98 (BP Location: Left Arm) Comment: re-evaluated in left arm; pt visibly tearful/upset  Pulse 97   Temp 98.5 F (36.9 C) (Oral)   Resp 20   LMP 06/01/2019   SpO2 98%   Visual Acuity Right Eye Distance:   Left Eye Distance:   Bilateral Distance:    Right Eye Near:   Left Eye Near:    Bilateral Near:     Physical Exam Constitutional:      Comments: Pt tearful   HENT:     Head: Normocephalic and atraumatic.     Nose: Nose normal.  Eyes:     Conjunctiva/sclera: Conjunctivae normal.  Pulmonary:     Effort: Pulmonary effort is normal.  Chest:     Breasts:        Right: Swelling, inverted nipple, mass, skin change and tenderness present.        Left: Normal.       Comments: Hard fixed mass palpated on entire right side of the right breast. Generalized erythema Large axillary  lymph node vs mass  with tenderness.  Soft fluctuant area to 7 o'clock below the right nipple.  Right nipple inverted and orange peel appearance  Musculoskeletal:     Cervical back: Normal range of motion.  Lymphadenopathy:     Upper Body:     Right upper body: Axillary adenopathy present.  Skin:    General: Skin is warm and dry.  Neurological:     Mental Status: She is alert.  Psychiatric:        Mood and Affect: Mood normal.     Comments: Anxious        UC Treatments / Results  Labs (all labs ordered are listed, but only abnormal results are displayed) Labs Reviewed  AEROBIC CULTURE (SUPERFICIAL SPECIMEN)  CYTOLOGY - NON PAP    EKG   Radiology No results found.  Procedures Procedures (including critical care time)  Medications Ordered in UC Medications - No data to display  Initial Impression / Assessment and Plan / UC Course  I have reviewed the triage vital signs and the nursing notes.  Pertinent labs & imaging results that were available during my care of the patient were reviewed by me and considered in my medical decision making (see chart for details).     Large breast abscess vs inflammatory breast cancer.  Attempted to aspirate the soft fluctuant area below the nipple with bloody aspirate material. About 3 cc.  Sent sample for testing.  Ordered pt mammogram and [place].  Placing on bactrim or abx coverage in case this is infectious in nature.  Pain medication prescribed Instruction given on follow up.   Follow up as needed for continued or worsening symptoms  Final Clinical Impressions(s) / UC Diagnoses   Final diagnoses:  Breast infection in female     Discharge Instructions     Treating you for possible infection.  Take the antibiotics as prescribed.  800 mg of ibuprofen 3 times a day for mild to moderate pain.  Tramadol for severe pain as needed every 6 hours You need to call [phone] to get things going with the ultrasound.  I have  already ordered the mammogram and ultrasound.  If symptoms worsen or you are unable to get  in to get the ultrasound mammogram in a timely  manner please follow-up    ED Prescriptions    Medication Sig Dispense Auth. Provider   sulfamethoxazole-trimethoprim (BACTRIM DS) 800-160 MG tablet Take 1 tablet by mouth 2 (two) times daily for 10 days. 20 tablet Westley Blass A, NP   ibuprofen (ADVIL) 800 MG tablet Take 1 tablet (800 mg total) by mouth 3 (three) times daily. 21 tablet Wilbert Hayashi A, NP   traMADol (ULTRAM) 50 MG tablet Take 1 tablet (50 mg total) by mouth every 6 (six) hours as needed. 12 tablet Rajanae Mantia A, NP     I have reviewed the PDMP during this encounter.   Loura Halt A, NP 08/01/19 1320

## 2019-08-02 LAB — AEROBIC CULTURE W GRAM STAIN (SUPERFICIAL SPECIMEN): Culture: NO GROWTH

## 2019-08-02 LAB — CYTOLOGY - NON PAP

## 2019-08-03 ENCOUNTER — Other Ambulatory Visit (HOSPITAL_COMMUNITY): Payer: Self-pay

## 2019-08-03 DIAGNOSIS — N611 Abscess of the breast and nipple: Secondary | ICD-10-CM

## 2019-08-07 ENCOUNTER — Other Ambulatory Visit: Payer: Medicaid Other

## 2019-08-07 ENCOUNTER — Telehealth (HOSPITAL_COMMUNITY): Payer: Self-pay

## 2019-08-07 ENCOUNTER — Ambulatory Visit (HOSPITAL_COMMUNITY): Payer: Medicaid Other

## 2019-08-07 NOTE — Telephone Encounter (Signed)
I contacted patient after she missed appointment with [facility], please see the next telephone notes.

## 2019-08-07 NOTE — Telephone Encounter (Signed)
I contacted patient after she missed 8:15 am [facility] appointment. Patient states her transportation was late picking her up and caused her to miss her appointment. Patient verbalized that she is scared and knows she needs to be seen. Patient informed to continue all antibiotics and treatment plan, appointment rescheduled to 08/14/19 @ 8:15 am  per Jerene Pitch and patient was also offered transportation services to appointment, then call disconnected.

## 2019-08-14 ENCOUNTER — Ambulatory Visit
Admission: RE | Admit: 2019-08-14 | Discharge: 2019-08-14 | Disposition: A | Discharge status: Home / Self Care | Payer: No Typology Code available for payment source | Source: Ambulatory Visit | Attending: Obstetrics and Gynecology | Admitting: Obstetrics and Gynecology

## 2019-08-14 ENCOUNTER — Other Ambulatory Visit: Payer: Self-pay

## 2019-08-14 ENCOUNTER — Other Ambulatory Visit: Payer: Self-pay | Admitting: Obstetrics and Gynecology

## 2019-08-14 ENCOUNTER — Ambulatory Visit
Admission: RE | Admit: 2019-08-14 | Discharge: 2019-08-14 | Disposition: A | Discharge status: Home / Self Care | Payer: Medicaid Other | Source: Ambulatory Visit | Attending: Obstetrics and Gynecology | Admitting: Obstetrics and Gynecology

## 2019-08-14 ENCOUNTER — Encounter: Payer: Self-pay | Admitting: Advanced Practice Midwife

## 2019-08-14 ENCOUNTER — Ambulatory Visit: Payer: Self-pay | Admitting: Advanced Practice Midwife

## 2019-08-14 VITALS — BP 144/118 | Temp 98.0°F | Wt 150.0 lb

## 2019-08-14 DIAGNOSIS — N6310 Unspecified lump in the right breast, unspecified quadrant: Secondary | ICD-10-CM

## 2019-08-14 DIAGNOSIS — N611 Abscess of the breast and nipple: Secondary | ICD-10-CM

## 2019-08-14 DIAGNOSIS — N63 Unspecified lump in unspecified breast: Secondary | ICD-10-CM

## 2019-08-14 NOTE — Progress Notes (Signed)
[name] is a 49 y.o. No obstetric history on file. female who presents to [facility] clinic today with complaint of breast pain and swelling. She was seen at the ED on 07/31/19 and had needle aspiration. Since then swelling has increased. She was given abx, but reports that there has been no change to the area.     Pap Smear:  Last Pap smear was 02/2019 at [facility] clinic and was normal per patient, will get records . Per patient has remote hx  of an abnormal Pap smear. Last Pap smear result is not available in Epic.   Physical exam: Breasts Right Breast: Large area consuming most of the lower portion of the breast below the nipple with associated skin changes. Also a small area in the axilla marked with a pink marker for Mammo today.  Left breast: Otherwise normal, no skin changes. Area marked around 2:00 from the nipple.         Pelvic/Bimanual Pap is not indicated today    Smoking History: Patient has is a current smoker at 1/4 packs per day referred to quit line.    Patient Navigation: Patient education provided. Access to services provided for patient through [facility] program.   Colorectal Cancer Screening: Per patient has never had colonoscopy completed No complaints today.    Breast and Cervical Cancer Risk Assessment: Patient has family history of breast cancer, known genetic mutations, or radiation treatment to the chest before age 2. Patient does not have history of cervical dysplasia, immunocompromised, or DES exposure in-utero.  Risk Assessment    Risk Scores      08/14/2019   Last edited by: Demetrius Revel, LPN   5-year risk: 1.8 %   Lifetime risk: 13.7 %          A: [facility] exam with pap smear Complaint of right breast pain and lump  P: Referred patient to the [facility] for a diagnostic mammogram. Appointment scheduled 08/14/19 at 9:40.  Marcille Buffy DNP, CNM  08/14/19  8:59 AM

## 2019-08-15 ENCOUNTER — Telehealth: Payer: Self-pay

## 2019-08-15 ENCOUNTER — Other Ambulatory Visit: Payer: Self-pay | Admitting: Obstetrics and Gynecology

## 2019-08-15 ENCOUNTER — Encounter: Payer: Self-pay | Admitting: *Deleted

## 2019-08-15 DIAGNOSIS — C50919 Malignant neoplasm of unspecified site of unspecified female breast: Secondary | ICD-10-CM

## 2019-08-15 NOTE — Telephone Encounter (Signed)
Medicaid application started for patient. Patient to sign and complete application on [id number] prior to 12 pm oncology appointment.

## 2019-08-16 ENCOUNTER — Telehealth: Payer: Self-pay | Admitting: *Deleted

## 2019-08-16 NOTE — Telephone Encounter (Signed)
Called pt to give navigation resources and contact information as well as discuss [place] on 2/24. Scheduled and confirmed appt for breast MRI at [facility] on 2/19 at 9:30am. Denies further questions at this time.

## 2019-08-17 ENCOUNTER — Ambulatory Visit (HOSPITAL_COMMUNITY): Payer: Self-pay

## 2019-08-17 ENCOUNTER — Other Ambulatory Visit: Payer: Self-pay | Admitting: *Deleted

## 2019-08-17 DIAGNOSIS — C50811 Malignant neoplasm of overlapping sites of right female breast: Secondary | ICD-10-CM | POA: Insufficient documentation

## 2019-08-21 NOTE — Progress Notes (Signed)
[facility] CONSULT NOTE  Patient Care Team: Patient, [name] as PCP - General (General Practice) [name], RN as Oncology Nurse Navigator [name], RN as Oncology Nurse Navigator [name], MD as Consulting Physician (General Surgery) [name], MD as Consulting Physician (Hematology and Oncology) [name], MD as Consulting Physician (Radiation Oncology)  CHIEF COMPLAINTS/PURPOSE OF CONSULTATION:  Newly diagnosed breast cancer  HISTORY OF PRESENTING ILLNESS:  Gina Ross 49 y.o. female is here because of recent diagnosis of invasive ductal carcinoma of the right breast. Patient palpated a right breast mass and right axilla mass and noted skin redness x2 weeks. She went to the ED and had an abscess drained with no improvement. Diagnostic mammogram and [place] on 08/14/19 showed a 7.3cm right breast mass extending into all 4 quadrants and partially into overlying skin and 12 abnormal right axillary lymph nodes. Biopsy on 08/14/19 showed invasive ductal carcinoma with necrosis in the breast and axilla, grade 3, HER-2 equivocal by IHC, negative by FISH, ER+ 40% weak, PR negative, Ki67 90%. She presents to the clinic today for initial evaluation and discussion of treatment options.  Patient has had extensive involvement of the right breast with malignancy.  Since the biopsy performed at the breast center there is oozing of serous fluid from the breast.  She is here today accompanied by her sister.  She is very emotional and tearful and has cried completely throughout the interview.  I reviewed her records extensively and collaborated the history with the patient.  SUMMARY OF ONCOLOGIC HISTORY: Oncology History  Malignant neoplasm of overlapping sites of right breast in female, estrogen receptor positive ([place])  08/17/2019 Initial Diagnosis   Patient palpated a right breast mass and right axilla mass and noted skin redness x2 weeks. She went to the  ED and had an abscess drained with no improvement. Diagnostic mammogram and US showed a 7.3cm right breast mass extending into all 4 quadrants and partially into overlying skin with 12 abnormal right axillary lymph nodes. Biopsy  showed IDC with necrosis in the breast and axilla, grade 3, HER-2 equivocal by IHC, negative by FISH, ER+ 40% weak, PR -, Ki67 90%.    08/28/2019 -  Chemotherapy   The patient had DOXOrubicin (ADRIAMYCIN) chemo injection 110 mg, 60 mg/m2 = 110 mg, Intravenous,  Once, 0 of 4 cycles palonosetron (ALOXI) injection 0.25 mg, 0.25 mg, Intravenous,  Once, 0 of 4 cycles pegfilgrastim-jmdb (FULPHILA) injection 6 mg, 6 mg, Subcutaneous,  Once, 0 of 4 cycles cyclophosphamide (CYTOXAN) 1,100 mg in sodium chloride 0.9 % 250 mL chemo infusion, 600 mg/m2 = 1,100 mg, Intravenous,  Once, 0 of 4 cycles PACLitaxel (TAXOL) 144 mg in sodium chloride 0.9 % 250 mL chemo infusion (</= 24m/m2), 80 mg/m2 = 144 mg, Intravenous,  Once, 0 of 12 cycles fosaprepitant (EMEND) 150 mg in sodium chloride 0.9 % 145 mL IVPB, 150 mg, Intravenous,  Once, 0 of 4 cycles  for chemotherapy treatment.      MEDICAL HISTORY:  Past Medical History:  Diagnosis Date  . Asthma    as a child    SURGICAL HISTORY: Past Surgical History:  Procedure Laterality Date  . COLONOSCOPY    . HARDWARE REMOVAL Right 09/11/2014   Procedure: Removal Deep Hardware Right Knee;  Surgeon: MNewt Minion MD;  Location[place] [facility]  Service: Orthopedics;  Laterality: Right;  . ORIF PATELLA Right 03/22/2014   Procedure: Open Reduction Internal Fixation Right Patella ;  Surgeon:  Newt Minion, MD;  Location: [facility];  Service: Orthopedics;  Laterality: Right;    SOCIAL HISTORY: Social History   Socioeconomic History  . Marital status: Legally Separated    Spouse name: Not on file  . Number of children: Not on file  . Years of education: Not on file  . Highest education level: Professional school degree (e.g., MD, DDS, DVM, JD)    Occupational History  . Not on file  Tobacco Use  . Smoking status: Current Every Day Smoker    Packs/day: 0.10  . Smokeless tobacco: Never Used  . Tobacco comment: marijuana  Substance and Sexual Activity  . Alcohol use: Yes    Comment: socailly  . Drug use: Yes    Types: Marijuana  . Sexual activity: Yes    Birth control/protection: None  Other Topics Concern  . Not on file  Social History Narrative  . Not on file   Social Determinants of Health   Financial Resource Strain:   . Difficulty of Paying Living Expenses: Not on file  Food Insecurity:   . Worried About Charity fundraiser in the Last Year: Not on file  . Ran Out of Food in the Last Year: Not on file  Transportation Needs: Unmet Transportation Needs  . Lack of Transportation (Medical): Yes  . Lack of Transportation (Non-Medical): Yes  Physical Activity:   . Days of Exercise per Week: Not on file  . Minutes of Exercise per Session: Not on file  Stress:   . Feeling of Stress : Not on file  Social Connections:   . Frequency of Communication with Friends and Family: Not on file  . Frequency of Social Gatherings with Friends and Family: Not on file  . Attends Religious Services: Not on file  . Active Member of Clubs or Organizations: Not on file  . Attends Archivist Meetings: Not on file  . Marital Status: Not on file  Intimate Partner Violence:   . Fear of Current or Ex-Partner: Not on file  . Emotionally Abused: Not on file  . Physically Abused: Not on file  . Sexually Abused: Not on file    FAMILY HISTORY: Family History  Problem Relation Age of Onset  . Hypertension Mother   . Diabetes Mother   . Breast cancer Mother   . Hypertension Father     ALLERGIES:  is allergic to penicillins.  MEDICATIONS:  Current Outpatient Medications  Medication Sig Dispense Refill  . acetaminophen (TYLENOL) 325 MG tablet Take 650 mg by mouth every 6 (six) hours as needed.    Marland Kitchen ibuprofen (ADVIL) 800 MG  tablet Take 1 tablet (800 mg total) by mouth 3 (three) times daily. 21 tablet 0  . traMADol (ULTRAM) 50 MG tablet Take 1 tablet (50 mg total) by mouth every 6 (six) hours as needed. 12 tablet 0  . dexamethasone (DECADRON) 4 MG tablet Take 1 tablet (4 mg total) by mouth daily for 2 days. Take 1 tablet day after chemo and 1 tablet 2 days after chemo with food. 8 tablet 0  . lidocaine-prilocaine (EMLA) cream Apply to affected area once 30 g 3  . LORazepam (ATIVAN) 0.5 MG tablet Take 1 tablet (0.5 mg total) by mouth at bedtime as needed (Nausea or vomiting). 30 tablet 0  . ondansetron (ZOFRAN) 8 MG tablet Take 1 tablet (8 mg total) by mouth 2 (two) times daily as needed. Start on the third day after chemotherapy. 30 tablet 1  . prochlorperazine (  COMPAZINE) 10 MG tablet Take 1 tablet (10 mg total) by mouth every 6 (six) hours as needed (Nausea or vomiting). 30 tablet 1   No current facility-administered medications for this visit.    REVIEW OF SYSTEMS:   Constitutional: Denies fevers, chills or abnormal night sweats Eyes: Denies blurriness of vision, double vision or watery eyes Ears, nose, mouth, throat, and face: Denies mucositis or sore throat Respiratory: Denies cough, dyspnea or wheezes Cardiovascular: Denies palpitation, chest discomfort or lower extremity swelling Gastrointestinal:  Denies nausea, heartburn or change in bowel habits Skin: Denies abnormal skin rashes Lymphatics: Denies new lymphadenopathy or easy bruising Neurological:Denies numbness, tingling or new weaknesses Behavioral/Psych: Mood is stable, no new changes  Breast: Palpable right breast and axilla masses All other systems were reviewed with the patient and are negative.  PHYSICAL EXAMINATION: ECOG PERFORMANCE STATUS: 2 - Symptomatic, <50% confined to bed  Vitals:   08/22/19 1256  BP: (!) 149/110  Pulse: 92  Resp: 18  Temp: 98.5 F (36.9 C)  SpO2: 100%   Filed Weights   08/22/19 1256  Weight: 149 lb 4.8 oz  (67.7 kg)    GENERAL:alert, no distress and comfortable SKIN: skin color, texture, turgor are normal, no rashes or significant lesions EYES: normal, conjunctiva are pink and non-injected, sclera clear OROPHARYNX:no exudate, no erythema and lips, buccal mucosa, and tongue normal  NECK: supple, thyroid normal size, non-tender, without nodularity LYMPH:  no palpable lymphadenopathy in the cervical, axillary or inguinal LUNGS: clear to auscultation and percussion with normal breathing effort HEART: regular rate & rhythm and no murmurs and no lower extremity edema ABDOMEN:abdomen soft, non-tender and normal bowel sounds Musculoskeletal:no cyanosis of digits and no clubbing  PSYCH: alert & oriented x 3 with fluent speech NEURO: no focal motor/sensory deficits BREAST: Left breast markedly distended and tender to palpation.  There is liquid oozing out of the recent biopsy site.  (exam performed in the presence of a chaperone)   LABORATORY DATA:  I have reviewed the data as listed Lab Results  Component Value Date   WBC 3.9 (L) 08/22/2019   HGB 12.1 08/22/2019   HCT 34.7 (L) 08/22/2019   MCV 88.3 08/22/2019   PLT 240 08/22/2019   Lab Results  Component Value Date   NA 141 08/22/2019   K 3.4 (L) 08/22/2019   CL 103 08/22/2019   CO2 28 08/22/2019    RADIOGRAPHIC STUDIES: I have personally reviewed the radiological reports and agreed with the findings in the report.  ASSESSMENT AND PLAN:  Malignant neoplasm of overlapping sites of right breast in female, estrogen receptor positive ([place]) 08/17/19: Patient palpated a right breast mass and right axilla mass and noted skin redness x2 weeks. She went to the ED and had an abscess drained with no improvement. Diagnostic mammogram and US showed a 7.3cm right breast mass extending into all 4 quadrants and partially into overlying skin with 12 abnormal right axillary lymph nodes. Biopsy  showed IDC with necrosis in the breast and axilla, grade 3,  HER-2 equivocal by IHC, negative by FISH, ER+ 40% weak, PR -, Ki67 90%.   Pathology and radiology counseling: Discussed with the patient, the details of pathology including the type of breast cancer,the clinical staging, the significance of ER, PR and HER-2/neu receptors and the implications for treatment. After reviewing the pathology in detail, we proceeded to discuss the different treatment options between surgery, radiation, chemotherapy, antiestrogen therapies.  Recommendation based on multidisciplinary tumor board: 1. Neoadjuvant chemotherapy with Adriamycin  and Cytoxan dose dense 4 followed by Taxol weekly 12 2. Followed by breast mastectomy and axillary lymph node dissection 3. Followed by adjuvant radiation therapy 4.  Follow-up adjuvant antiestrogen therapy  Chemotherapy Counseling: I discussed the risks and benefits of chemotherapy including the risks of nausea/ vomiting, risk of infection from low WBC count, fatigue due to chemo or anemia, bruising or bleeding due to low platelets, mouth sores, loss/ change in taste and decreased appetite. Liver and kidney function will be monitored through out chemotherapy as abnormalities in liver and kidney function may be a side effect of treatment. Cardiac dysfunction due to Adriamycin was discussed in detail. Risk of permanent bone marrow dysfunction due to chemo were also discussed.  Plan: 1. Port placement  2. Echocardiogram 3. Chemotherapy class 4. Breast MRI 5. CT chest abdomen pelvis and bone scan for staging Genetic counseling will also be arranged  Inflammatory breast cancer changes: Stage IIIb At the CT scan showed distant metastatic disease and we may have to come up with a different treatment plan we are Return to clinic in 1 week to start chemotherapy.      All questions were answered. The patient knows to call the clinic with any problems, questions or concerns.   Rulon Eisenmenger, MD, MPH 08/22/2019    I, Molly  Dorshimer, am acting as scribe for [name], MD.  I have reviewed the above documentation for accuracy and completeness, and I agree with the above.

## 2019-08-22 ENCOUNTER — Other Ambulatory Visit: Payer: Self-pay | Admitting: *Deleted

## 2019-08-22 ENCOUNTER — Encounter: Payer: Self-pay | Admitting: Genetic Counselor

## 2019-08-22 ENCOUNTER — Inpatient Hospital Stay: Payer: Medicaid Other | Attending: Hematology and Oncology | Admitting: Hematology and Oncology

## 2019-08-22 ENCOUNTER — Inpatient Hospital Stay: Payer: Medicaid Other

## 2019-08-22 ENCOUNTER — Ambulatory Visit
Admission: RE | Admit: 2019-08-22 | Discharge: 2019-08-22 | Disposition: A | Discharge status: Home / Self Care | Payer: Medicaid Other | Source: Ambulatory Visit | Attending: Radiation Oncology | Admitting: Radiation Oncology

## 2019-08-22 ENCOUNTER — Encounter: Payer: Self-pay | Admitting: Physical Therapy

## 2019-08-22 ENCOUNTER — Encounter: Payer: Self-pay | Admitting: Licensed Clinical Social Worker

## 2019-08-22 ENCOUNTER — Encounter: Payer: Self-pay | Admitting: *Deleted

## 2019-08-22 ENCOUNTER — Other Ambulatory Visit: Payer: Self-pay

## 2019-08-22 ENCOUNTER — Ambulatory Visit (HOSPITAL_BASED_OUTPATIENT_CLINIC_OR_DEPARTMENT_OTHER): Payer: Medicaid Other | Admitting: Genetic Counselor

## 2019-08-22 ENCOUNTER — Ambulatory Visit: Payer: Medicaid Other | Attending: General Surgery | Admitting: Physical Therapy

## 2019-08-22 VITALS — BP 149/110 | HR 92 | Temp 98.5°F | Resp 18 | Ht 69.0 in | Wt 149.3 lb

## 2019-08-22 DIAGNOSIS — Z803 Family history of malignant neoplasm of breast: Secondary | ICD-10-CM

## 2019-08-22 DIAGNOSIS — F17210 Nicotine dependence, cigarettes, uncomplicated: Secondary | ICD-10-CM | POA: Diagnosis not present

## 2019-08-22 DIAGNOSIS — Z800 Family history of malignant neoplasm of digestive organs: Secondary | ICD-10-CM

## 2019-08-22 DIAGNOSIS — C773 Secondary and unspecified malignant neoplasm of axilla and upper limb lymph nodes: Secondary | ICD-10-CM | POA: Diagnosis not present

## 2019-08-22 DIAGNOSIS — Z8042 Family history of malignant neoplasm of prostate: Secondary | ICD-10-CM

## 2019-08-22 DIAGNOSIS — C50811 Malignant neoplasm of overlapping sites of right female breast: Secondary | ICD-10-CM | POA: Diagnosis not present

## 2019-08-22 DIAGNOSIS — R293 Abnormal posture: Secondary | ICD-10-CM | POA: Insufficient documentation

## 2019-08-22 DIAGNOSIS — Z170 Estrogen receptor positive status [ER+]: Secondary | ICD-10-CM

## 2019-08-22 LAB — CBC WITH DIFFERENTIAL (CANCER CENTER ONLY)
Abs Immature Granulocytes: 0 10*3/uL (ref 0.00–0.07)
Basophils Absolute: 0 10*3/uL (ref 0.0–0.1)
Basophils Relative: 1 %
Eosinophils Absolute: 0.1 10*3/uL (ref 0.0–0.5)
Eosinophils Relative: 2 %
HCT: 34.7 % — ABNORMAL LOW (ref 36.0–46.0)
Hemoglobin: 12.1 g/dL (ref 12.0–15.0)
Immature Granulocytes: 0 %
Lymphocytes Relative: 37 %
Lymphs Abs: 1.4 10*3/uL (ref 0.7–4.0)
MCH: 30.8 pg (ref 26.0–34.0)
MCHC: 34.9 g/dL (ref 30.0–36.0)
MCV: 88.3 fL (ref 80.0–100.0)
Monocytes Absolute: 0.5 10*3/uL (ref 0.1–1.0)
Monocytes Relative: 12 %
Neutro Abs: 1.9 10*3/uL (ref 1.7–7.7)
Neutrophils Relative %: 48 %
Platelet Count: 240 10*3/uL (ref 150–400)
RBC: 3.93 MIL/uL (ref 3.87–5.11)
RDW: 11.6 % (ref 11.5–15.5)
WBC Count: 3.9 10*3/uL — ABNORMAL LOW (ref 4.0–10.5)
nRBC: 0 % (ref 0.0–0.2)

## 2019-08-22 LAB — CMP (CANCER CENTER ONLY)
ALT: 11 U/L (ref 0–44)
AST: 18 U/L (ref 15–41)
Albumin: 3.9 g/dL (ref 3.5–5.0)
Alkaline Phosphatase: 59 U/L (ref 38–126)
Anion gap: 10 (ref 5–15)
BUN: 9 mg/dL (ref 6–20)
CO2: 28 mmol/L (ref 22–32)
Calcium: 8.9 mg/dL (ref 8.9–10.3)
Chloride: 103 mmol/L (ref 98–111)
Creatinine: 0.86 mg/dL (ref 0.44–1.00)
GFR, Est AFR Am: >60 mL/min (ref >60)
GFR, Estimated: >60 mL/min (ref >60)
Glucose, Bld: 104 mg/dL — ABNORMAL HIGH (ref 70–99)
Potassium: 3.4 mmol/L — ABNORMAL LOW (ref 3.5–5.1)
Sodium: 141 mmol/L (ref 135–145)
Total Bilirubin: 0.2 mg/dL — ABNORMAL LOW (ref 0.3–1.2)
Total Protein: 7.8 g/dL (ref 6.5–8.1)

## 2019-08-22 LAB — GENETIC SCREENING ORDER

## 2019-08-22 MED ORDER — PROCHLORPERAZINE MALEATE 10 MG PO TABS: 10.0000 mg | ORAL_TABLET | Freq: Four times a day (QID) | ORAL | 1 refills | Status: DC | PRN

## 2019-08-22 MED ORDER — LORAZEPAM 0.5 MG PO TABS: 0.5000 mg | ORAL_TABLET | Freq: Every evening | ORAL | 0 refills | Status: DC | PRN

## 2019-08-22 MED ORDER — DEXAMETHASONE 4 MG PO TABS: 4.0000 mg | ORAL_TABLET | Freq: Every day | ORAL | 0 refills | Status: DC

## 2019-08-22 MED ORDER — LIDOCAINE-PRILOCAINE 2.5-2.5 % EX CREA: TOPICAL_CREAM | CUTANEOUS | 3 refills | Status: DC

## 2019-08-22 MED ORDER — ONDANSETRON HCL 8 MG PO TABS: 8.0000 mg | ORAL_TABLET | Freq: Two times a day (BID) | ORAL | 1 refills | Status: DC | PRN

## 2019-08-22 NOTE — Patient Instructions (Signed)

## 2019-08-22 NOTE — Assessment & Plan Note (Addendum)
08/17/19: Patient palpated a right breast mass and right axilla mass and noted skin redness x2 weeks. She went to the ED and had an abscess drained with no improvement. Diagnostic mammogram and US showed a 7.3cm right breast mass extending into all 4 quadrants and partially into overlying skin with 12 abnormal right axillary lymph nodes. Biopsy  showed IDC with necrosis in the breast and axilla, grade 3, HER-2 equivocal by IHC, negative by FISH, ER+ 40% weak, PR -, Ki67 90%.   Pathology and radiology counseling: Discussed with the patient, the details of pathology including the type of breast cancer,the clinical staging, the significance of ER, PR and HER-2/neu receptors and the implications for treatment. After reviewing the pathology in detail, we proceeded to discuss the different treatment options between surgery, radiation, chemotherapy, antiestrogen therapies.  Recommendation based on multidisciplinary tumor board: 1. Neoadjuvant chemotherapy with Adriamycin and Cytoxan dose dense 4 followed by Taxol weekly 12 2. Followed by breast mastectomy and axillary lymph node dissection 3. Followed by adjuvant radiation therapy 4.  Follow-up adjuvant antiestrogen therapy  Chemotherapy Counseling: I discussed the risks and benefits of chemotherapy including the risks of nausea/ vomiting, risk of infection from low WBC count, fatigue due to chemo or anemia, bruising or bleeding due to low platelets, mouth sores, loss/ change in taste and decreased appetite. Liver and kidney function will be monitored through out chemotherapy as abnormalities in liver and kidney function may be a side effect of treatment. Cardiac dysfunction due to Adriamycin was discussed in detail. Risk of permanent bone marrow dysfunction due to chemo were also discussed.  Plan: 1. Port placement  2. Echocardiogram 3. Chemotherapy class 4. Breast MRI 5. CT chest abdomen pelvis and bone scan for staging Genetic counseling will also be  arranged  Return to clinic in 1 week to start chemotherapy.

## 2019-08-22 NOTE — Progress Notes (Signed)
i

## 2019-08-22 NOTE — Progress Notes (Signed)
REFERRING PROVIDER: [name], MD [place],  [place] [id number]  PRIMARY PROVIDER:  Patient, [name]  PRIMARY REASON FOR VISIT:  1. Malignant neoplasm of overlapping sites of right breast in female, estrogen receptor positive ([place])   2. Family history of breast cancer   3. Family history of prostate cancer   4. Family history of colon cancer      I connected with Gina Ross on 08/22/2019 at 3:00 pm EDT by Webex video conference and verified that I am speaking with the correct person using two identifiers.   Patient location: [facility] clinic Provider location: [facility] office  HISTORY OF PRESENT ILLNESS:   [name], a 49 y.o. female, was seen for a  cancer genetics consultation at the request of Dr. Lindi Adie due to a personal and family history of breast cancer.  Gina Ross presents to clinic today to discuss the possibility of a hereditary predisposition to cancer, genetic testing, and to further clarify her future cancer risks, as well as potential cancer risks for family members.   In 2021, at the age of 12, Gina Ross was diagnosed with IDC, ER+/PR-/Her2-, of the right breast. The treatment plan includes chemotherapy, surgery (mastectomy), adjuvant radiation therapy, and adjuvant antiestrogen therapy.    CANCER HISTORY:  Oncology History  Malignant neoplasm of overlapping sites of right breast in female, estrogen receptor positive ([place])  08/17/2019 Initial Diagnosis   Patient palpated a right breast mass and right axilla mass and noted skin redness x2 weeks. She went to the ED and had an abscess drained with no improvement. Diagnostic mammogram and US showed a 7.3cm right breast mass extending into all 4 quadrants and partially into overlying skin with 12 abnormal right axillary lymph nodes. Biopsy  showed IDC with necrosis in the breast and axilla, grade 3, HER-2 equivocal by IHC, negative by FISH, ER+ 40% weak, PR -, Ki67 90%.    08/28/2019 -   Chemotherapy   The patient had DOXOrubicin (ADRIAMYCIN) chemo injection 110 mg, 60 mg/m2 = 110 mg, Intravenous,  Once, 0 of 4 cycles palonosetron (ALOXI) injection 0.25 mg, 0.25 mg, Intravenous,  Once, 0 of 4 cycles pegfilgrastim-jmdb (FULPHILA) injection 6 mg, 6 mg, Subcutaneous,  Once, 0 of 4 cycles cyclophosphamide (CYTOXAN) 1,100 mg in sodium chloride 0.9 % 250 mL chemo infusion, 600 mg/m2 = 1,100 mg, Intravenous,  Once, 0 of 4 cycles PACLitaxel (TAXOL) 144 mg in sodium chloride 0.9 % 250 mL chemo infusion (</= 62m/m2), 80 mg/m2 = 144 mg, Intravenous,  Once, 0 of 12 cycles fosaprepitant (EMEND) 150 mg in sodium chloride 0.9 % 145 mL IVPB, 150 mg, Intravenous,  Once, 0 of 4 cycles  for chemotherapy treatment.       RISK FACTORS:  Menarche was at age 49  First live birth at age 49  OCP use for approximately 2-3 years.  Ovaries intact: yes.  Hysterectomy: no.  Menopausal status: premenopausal.  HRT use: 0 years. Colonoscopy: yes; approximately 20 years ago, normal per patient. Mammogram within the last year: yes. Number of breast biopsies: 1. Any excessive radiation exposure in the past: no  Past Medical History:  Diagnosis Date  . Asthma    as a child  . Family history of breast cancer   . Family history of colon cancer   . Family history of prostate cancer     Past Surgical History:  Procedure Laterality Date  . COLONOSCOPY    . HARDWARE REMOVAL Right 09/11/2014  Procedure: Removal Deep Hardware Right Knee;  Surgeon: Newt Minion, MD;  Location: [place];  Service: Orthopedics;  Laterality: Right;  . ORIF PATELLA Right 03/22/2014   Procedure: Open Reduction Internal Fixation Right Patella ;  Surgeon: Newt Minion, MD;  Location: [place];  Service: Orthopedics;  Laterality: Right;    Social History   Socioeconomic History  . Marital status: Legally Separated    Spouse name: Not on file  . Number of children: Not on file  . Years of education: Not on file  . Highest  education level: Professional school degree (e.g., MD, DDS, DVM, JD)  Occupational History  . Not on file  Tobacco Use  . Smoking status: Current Every Day Smoker    Packs/day: 0.10  . Smokeless tobacco: Never Used  . Tobacco comment: marijuana  Substance and Sexual Activity  . Alcohol use: Yes    Comment: socailly  . Drug use: Yes    Types: Marijuana  . Sexual activity: Yes    Birth control/protection: None  Other Topics Concern  . Not on file  Social History Narrative  . Not on file   Social Determinants of Health   Financial Resource Strain:   . Difficulty of Paying Living Expenses: Not on file  Food Insecurity:   . Worried About Charity fundraiser in the Last Year: Not on file  . Ran Out of Food in the Last Year: Not on file  Transportation Needs: Unmet Transportation Needs  . Lack of Transportation (Medical): Yes  . Lack of Transportation (Non-Medical): Yes  Physical Activity:   . Days of Exercise per Week: Not on file  . Minutes of Exercise per Session: Not on file  Stress:   . Feeling of Stress : Not on file  Social Connections:   . Frequency of Communication with Friends and Family: Not on file  . Frequency of Social Gatherings with Friends and Family: Not on file  . Attends Religious Services: Not on file  . Active Member of Clubs or Organizations: Not on file  . Attends Archivist Meetings: Not on file  . Marital Status: Not on file     FAMILY HISTORY:  We obtained a detailed, 4-generation family history.  Significant diagnoses are listed below: Family History  Problem Relation Age of Onset  . Hypertension Mother   . Diabetes Mother   . Breast cancer Mother        dx. 74s  . Hypertension Father   . Colon cancer Paternal Aunt        dx. 29s  . Breast cancer Maternal Grandmother        bilateral  . Cervical cancer Maternal Grandmother        tumor on cervix  . Prostate cancer Maternal Uncle        dx. 33s  . Breast cancer Cousin         dx. late 35s  . Breast cancer Cousin        dx. <50   Gina Ross has one son who is 43, and one sister, neither of whom have had cancer.   Gina Ross mother is currently living at age 75, and has a history of breast cancer diagnosed in her 11s. Gina Ross has three maternal uncles and one maternal aunt. One of her uncles had prostate cancer diagnosed in his 61s. She has a maternal cousin who had breast cancer diagnosed in her late 18s. Gina Ross maternal grandmother had bilateral  breast cancer diagnosed at an unknown age, and another cancer (tumor on her cervix) in her 46s. There are no other known diagnoses of cancer on the maternal side of the family.  Gina Ross father is currently living at the age of 50 and has not had cancer. She has two paternal aunts. One of her aunts is deceased and had colon cancer in her 32s. She also has a paternal cousin who had breast cancer diagnosed younger than 50 years old. Her paternal grandmother died at age 69 and she is unsure how old her grandfather was when he died. There are no other known diagnoses of cancer on the paternal side of the family.  Gina Ross is unaware of previous family history of genetic testing for hereditary cancer risks.  GENETIC COUNSELING ASSESSMENT: Gina Ross is a 49 y.o. female with a personal and family history of breast cancer which is somewhat suggestive of a hereditary cancer syndrome and predisposition to cancer. We, therefore, discussed and recommended the following at today's visit.   DISCUSSION: We discussed that 5 - 10% of breast cancer is hereditary, with most cases associated with the BRCA1 and BRCA2 genes.  There are other genes that can be associated with hereditary breast cancer cancer syndromes.  These include ATM, CHEK2, PALB2, etc.  We discussed that testing is beneficial for several reasons including knowing about other cancer risks, identifying potential screening and risk-reduction options that may be  appropriate, and to understand if other family members could be at risk for cancer and allow them to undergo genetic testing.   We reviewed the characteristics, features and inheritance patterns of hereditary cancer syndromes. We also discussed genetic testing, including the appropriate family members to test, the process of testing, insurance coverage and turn-around-time for results. We discussed the implications of a negative, positive and/or variant of uncertain significant result. We recommended Gina Ross pursue genetic testing for the [facility] Common Hereditary Cancers panel.   The Common Hereditary Cancers Panel offered by [facility] includes sequencing and/or deletion duplication testing of the following 48 genes: APC, ATM, AXIN2, BARD1, BMPR1A, BRCA1, BRCA2, BRIP1, CDH1, CDK4, CDKN2A (p14ARF), CDKN2A (p16INK4a), CHEK2, CTNNA1, DICER1, EPCAM (Deletion/duplication testing only), GREM1 (promoter region deletion/duplication testing only), KIT, MEN1, MLH1, MSH2, MSH3, MSH6, MUTYH, NBN, NF1, NHTL1, PALB2, PDGFRA, PMS2, POLD1, POLE, PTEN, RAD50, RAD51C, RAD51D, RNF43, SDHB, SDHC, SDHD, SMAD4, SMARCA4. STK11, TP53, TSC1, TSC2, and VHL.  The following genes are evaluated for sequence changes only: SDHA and HOXB13 c.251G>A variant only.   Based on Gina Ross personal and family history of cancer, she meets medical criteria for genetic testing. Despite that she meets criteria, she may still have an out of pocket cost. We discussed that she qualifies for [facility]'s patient assistance program, which will waive the cost of her genetic testing if she provides her most recent federal tax return form. Otherwise, the out of pocket cost may be $250.   PLAN: After considering the risks, benefits, and limitations, Gina Ross provided informed consent to pursue genetic testing and the blood sample was sent to [facility] for analysis of the Common Hereditary Cancers panel. Results should be available within  approximately two-three weeks' time, at which point they will be disclosed by telephone to Gina Ross, as will any additional recommendations warranted by these results. Gina Ross will receive a summary of her genetic counseling visit and a copy of her results once available. This information will also be available in Epic.   Gina Ross questions were answered  to her satisfaction today. Our contact information was provided should additional questions or concerns arise. Thank you for the referral and allowing [place] to share in the care of your patient.   Clint Guy, MS, [facility] Genetic Counselor [email or web address] Phone: [phone]  The patient was seen for a total of 20 minutes in face-to-face genetic counseling.  This patient was discussed with Drs. Magrinat, Lindi Adie and/or Burr Medico who agrees with the above.    _______________________________________________________________________ For Office Staff:  Number of people involved in session: 1 Was an Intern/ student involved with case: no

## 2019-08-22 NOTE — Progress Notes (Signed)
[place]  RADIATION ONCOLOGY CONSULT NOTE  CHIEF COMPLAINTS/PURPOSE OF CONSULTATION:  Newly diagnosed breast cancer  HISTORY OF PRESENTING ILLNESS:  Gina Ross 49 y.o. female is here because of recent diagnosis of invasive ductal carcinoma of the right breast. Patient palpated a right breast mass and right axilla mass and noted skin redness x2 weeks. She went to the ED and had an abscess drained with no improvement. Diagnostic mammogram and [place] on 08/14/19 showed a 7.3cm right breast mass extending into all 4 quadrants and partially into overlying skin and 12 abnormal right axillary lymph nodes. Biopsy on 08/14/19 showed invasive ductal carcinoma with necrosis in the breast and axilla, grade 3, HER-2 equivocal by IHC, negative by FISH, ER+ 40% weak, PR negative, Ki67 90%. She presents to the clinic today for initial evaluation and discussion of treatment options.  Patient has had extensive involvement of the right breast with malignancy.  Since the biopsy performed at the breast center there is oozing of serous fluid from the breast.  She is here today accompanied by her sister.  She is very emotional today    I reviewed her records extensively and collaborated the history with the patient.  SUMMARY OF ONCOLOGIC HISTORY: Oncology History  Malignant neoplasm of overlapping sites of right breast in female, estrogen receptor positive ([place])  08/17/2019 Initial Diagnosis   Patient palpated a right breast mass and right axilla mass and noted skin redness x2 weeks. She went to the ED and had an abscess drained with no improvement. Diagnostic mammogram and US showed a 7.3cm right breast mass extending into all 4 quadrants and partially into overlying skin with 12 abnormal right axillary lymph nodes. Biopsy  showed IDC with necrosis in the breast and axilla, grade 3, HER-2 equivocal by IHC, negative by FISH, ER+ 40% weak, PR -, Ki67 90%.    08/28/2019 -  Chemotherapy   The patient had  DOXOrubicin (ADRIAMYCIN) chemo injection 110 mg, 60 mg/m2 = 110 mg, Intravenous,  Once, 0 of 4 cycles palonosetron (ALOXI) injection 0.25 mg, 0.25 mg, Intravenous,  Once, 0 of 4 cycles pegfilgrastim-jmdb (FULPHILA) injection 6 mg, 6 mg, Subcutaneous,  Once, 0 of 4 cycles cyclophosphamide (CYTOXAN) 1,100 mg in sodium chloride 0.9 % 250 mL chemo infusion, 600 mg/m2 = 1,100 mg, Intravenous,  Once, 0 of 4 cycles PACLitaxel (TAXOL) 144 mg in sodium chloride 0.9 % 250 mL chemo infusion (</= 4m/m2), 80 mg/m2 = 144 mg, Intravenous,  Once, 0 of 12 cycles fosaprepitant (EMEND) 150 mg in sodium chloride 0.9 % 145 mL IVPB, 150 mg, Intravenous,  Once, 0 of 4 cycles  for chemotherapy treatment.      MEDICAL HISTORY:  Past Medical History:  Diagnosis Date  . Asthma    as a child  . Family history of breast cancer   . Family history of colon cancer   . Family history of prostate cancer     SURGICAL HISTORY: Past Surgical History:  Procedure Laterality Date  . COLONOSCOPY    . HARDWARE REMOVAL Right 09/11/2014   Procedure: Removal Deep Hardware Right Knee;  Surgeon: MNewt Minion MD;  Location[place]  Service: Orthopedics;  Laterality: Right;  . ORIF PATELLA Right 03/22/2014   Procedure: Open Reduction Internal Fixation Right Patella ;  Surgeon: MNewt Minion MD;  Location[place]  Service: Orthopedics;  Laterality: Right;    SOCIAL HISTORY: Social History   Socioeconomic History  . Marital status: Legally Separated  Spouse name: Not on file  . Number of children: Not on file  . Years of education: Not on file  . Highest education level: Professional school degree (e.g., MD, DDS, DVM, JD)  Occupational History  . Not on file  Tobacco Use  . Smoking status: Current Every Day Smoker    Packs/day: 0.10  . Smokeless tobacco: Never Used  . Tobacco comment: marijuana  Substance and Sexual Activity  . Alcohol use: Yes    Comment: socailly  . Drug use: Yes    Types: Marijuana  . Sexual  activity: Yes    Birth control/protection: None  Other Topics Concern  . Not on file  Social History Narrative  . Not on file   Social Determinants of Health   Financial Resource Strain:   . Difficulty of Paying Living Expenses: Not on file  Food Insecurity:   . Worried About Charity fundraiser in the Last Year: Not on file  . Ran Out of Food in the Last Year: Not on file  Transportation Needs: Unmet Transportation Needs  . Lack of Transportation (Medical): Yes  . Lack of Transportation (Non-Medical): Yes  Physical Activity:   . Days of Exercise per Week: Not on file  . Minutes of Exercise per Session: Not on file  Stress:   . Feeling of Stress : Not on file  Social Connections:   . Frequency of Communication with Friends and Family: Not on file  . Frequency of Social Gatherings with Friends and Family: Not on file  . Attends Religious Services: Not on file  . Active Member of Clubs or Organizations: Not on file  . Attends Archivist Meetings: Not on file  . Marital Status: Not on file  Intimate Partner Violence:   . Fear of Current or Ex-Partner: Not on file  . Emotionally Abused: Not on file  . Physically Abused: Not on file  . Sexually Abused: Not on file    FAMILY HISTORY: Family History  Problem Relation Age of Onset  . Hypertension Mother   . Diabetes Mother   . Breast cancer Mother        dx. 26s  . Hypertension Father   . Colon cancer Paternal Aunt        dx. 54s  . Breast cancer Maternal Grandmother        bilateral  . Cervical cancer Maternal Grandmother        tumor on cervix  . Prostate cancer Maternal Uncle        dx. 35s  . Breast cancer Cousin        dx. late 47s  . Breast cancer Cousin        dx. <50    ALLERGIES:  is allergic to penicillins.  MEDICATIONS:  Current Outpatient Medications  Medication Sig Dispense Refill  . acetaminophen (TYLENOL) 325 MG tablet Take 650 mg by mouth every 6 (six) hours as needed.    Marland Kitchen  dexamethasone (DECADRON) 4 MG tablet Take 1 tablet (4 mg total) by mouth daily for 2 days. Take 1 tablet day after chemo and 1 tablet 2 days after chemo with food. 8 tablet 0  . ibuprofen (ADVIL) 800 MG tablet Take 1 tablet (800 mg total) by mouth 3 (three) times daily. 21 tablet 0  . lidocaine-prilocaine (EMLA) cream Apply to affected area once 30 g 3  . LORazepam (ATIVAN) 0.5 MG tablet Take 1 tablet (0.5 mg total) by mouth at bedtime as needed (Nausea or  vomiting). 30 tablet 0  . ondansetron (ZOFRAN) 8 MG tablet Take 1 tablet (8 mg total) by mouth 2 (two) times daily as needed. Start on the third day after chemotherapy. 30 tablet 1  . prochlorperazine (COMPAZINE) 10 MG tablet Take 1 tablet (10 mg total) by mouth every 6 (six) hours as needed (Nausea or vomiting). 30 tablet 1  . traMADol (ULTRAM) 50 MG tablet Take 1 tablet (50 mg total) by mouth every 6 (six) hours as needed. 12 tablet 0   No current facility-administered medications for this encounter.    REVIEW OF SYSTEMS:   Constitutional: Denies fevers, chills or abnormal night sweats Eyes: Denies blurriness of vision, double vision or watery eyes Ears, nose, mouth, throat, and face: Denies mucositis or sore throat Respiratory: Denies cough, dyspnea or wheezes Cardiovascular: Denies palpitation, chest discomfort or lower extremity swelling Gastrointestinal:  Denies nausea, heartburn or change in bowel habits Skin: Denies abnormal skin rashes Lymphatics: Denies new lymphadenopathy or easy bruising Neurological:Denies numbness, tingling or new weaknesses Behavioral/Psych: Mood is stable, no new changes  Breast: Palpable right breast and axilla masses All other systems were reviewed with the patient and are negative.  PHYSICAL EXAMINATION: ECOG PERFORMANCE STATUS: 2 - Symptomatic, <50% confined to bed   Vitals - 1 value per visit 8/78/6767  SYSTOLIC 209  DIASTOLIC 470  Pulse 92  Temperature 98.5  Respirations 18  Weight (lb) 149.3   Height _0   BMI 22.05  VISIT REPORT     GENERAL:alert, no distress, teary-eyed SKIN: skin color, texture, turgor are normal, no rashes or significant lesions EYES: normal, conjunctiva are pink and non-injected, sclera clear NECK: supple, thyroid normal size, non-tender, without nodularity LYMPH:  no palpable lymphadenopathy in the cervical, axillary LUNGS: clear to auscultation and percussion with normal breathing effort HEART: regular rate & rhythm and no murmurs and no lower extremity edema ABDOMEN:abdomen soft, non-tender and normal bowel sounds Musculoskeletal:no cyanosis of digits and no clubbing  PSYCH: alert & oriented x 3 with fluent speech NEURO: no focal motor/sensory deficits BREAST: right breast markedly distended and tender to palpation.  Firm mass occupies approximately three quarters of the breast there is liquid oozing out of the recent biopsy site.  Dressing in place along the lateral aspect of the breast.  Nipple is retracted.  Patient has matted nodes in the right axillary region extending over approximately 4 x 5 cm.  LABORATORY DATA:  I have reviewed the data as listed Lab Results  Component Value Date   WBC 3.9 (L) 08/22/2019   HGB 12.1 08/22/2019   HCT 34.7 (L) 08/22/2019   MCV 88.3 08/22/2019   PLT 240 08/22/2019   Lab Results  Component Value Date   NA 141 08/22/2019   K 3.4 (L) 08/22/2019   CL 103 08/22/2019   CO2 28 08/22/2019    RADIOGRAPHIC STUDIES: I have personally reviewed the radiological reports and agreed with the findings in the report.  ASSESSMENT AND PLAN:  Malignant neoplasm of overlapping sites of right breast in female, estrogen receptor positive ([place]) 08/17/19: Patient palpated a right breast mass and right axilla mass and noted skin redness x2 weeks. She went to the ED and had an abscess drained with no improvement. Diagnostic mammogram and US showed a 7.3cm right breast mass extending into all 4 quadrants and partially into  overlying skin with 12 abnormal right axillary lymph nodes. Biopsy  showed IDC with necrosis in the breast and axilla, grade 3, HER-2 equivocal by IHC, negative  by FISH, ER+ 40% weak, PR -, Ki67 90%.  Clinical stage IIIc  Pathology and radiology counseling: Discussed with the patient, the details of pathology including the type of breast cancer,the clinical staging, the significance of ER, PR and HER-2/neu receptors and the implications for treatment. After reviewing the pathology in detail, we proceeded to discuss the different treatment options between surgery, radiation, chemotherapy, antiestrogen therapies.  Recommendation based on multidisciplinary tumor board: 1. Neoadjuvant chemotherapy with Adriamycin and Cytoxan dose dense 4 followed by Taxol weekly 12 2. Followed by breast mastectomy and axillary lymph node dissection 3. Followed by adjuvant postmastectomy radiation therapy 4.  Follow-up adjuvant antiestrogen therapy   Plan: 1. Port placement  2. Echocardiogram 3. Chemotherapy class 4. Breast MRI 5. CT chest abdomen pelvis and bone scan for staging Genetic counseling will also be arranged   -----------------------------------  Blair Promise, PhD, MD

## 2019-08-22 NOTE — Therapy (Signed)
[place], [place], [address] Phone: [phone]   Fax:  [phone]  Physical Therapy Evaluation  Patient Details  Name: Gina Ross MRN: [id number] Date of Birth: [birth year] Referring Provider (PT): Dr. [name]   Encounter Date: 08/22/2019  PT End of Session - 08/22/19 1740    Visit Number  1    Number of Visits  2    Date for PT Re-Evaluation  02/19/20    PT Start Time  2878    PT Stop Time  6767   Also saw pt from 1447-1456 for a total of 22 min   PT Time Calculation (min)  13 min    Activity Tolerance  Patient tolerated treatment well    Behavior During Therapy  [facility] for tasks assessed/performed       Past Medical History:  Diagnosis Date  . Asthma    as a child  . Family history of breast cancer   . Family history of colon cancer   . Family history of prostate cancer     Past Surgical History:  Procedure Laterality Date  . COLONOSCOPY    . HARDWARE REMOVAL Right 09/11/2014   Procedure: Removal Deep Hardware Right Knee;  Surgeon: Newt Minion, MD;  Location: [facility];  Service: Orthopedics;  Laterality: Right;  . ORIF PATELLA Right 03/22/2014   Procedure: Open Reduction Internal Fixation Right Patella ;  Surgeon: Newt Minion, MD;  Location: [facility];  Service: Orthopedics;  Laterality: Right;    There were no vitals filed for this visit.   Subjective Assessment - 08/22/19 1720    Subjective  Patient reports she is here today to be seen by her medical team for her newly diagnosed right breast cancer.    Patient is accompained by:  Family member    Pertinent History  Patient was diganosed on 08/14/2019 with right invasive ductal carcinoma breast cancer. It measures 7.3 cm and is located in various quadrants. It is ER positive, PR negative, and HER2 negative with a Ki67 of 90%.    Patient Stated Goals  Reduce lymphedema risk and learn post op shoulder ROM HEP    Currently in  Pain?  No/denies         [facility] PT Assessment - 08/22/19 0001      Assessment   Medical Diagnosis  Right breast cancer    Referring Provider (PT)  Dr. [name]    Onset Date/Surgical Date  08/14/19    Hand Dominance  Right    Prior Therapy  none      Precautions   Precautions  Other (comment)    Precaution Comments  active cancer      Restrictions   Weight Bearing Restrictions  No      Balance Screen   Has the patient fallen in the past 6 months  No    Has the patient had a decrease in activity level because of a fear of falling?   No    Is the patient reluctant to leave their home because of a fear of falling?   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 [place]  She does not exercise      Cognition   Overall Cognitive Status  Within Functional Limits for tasks assessed    Behaviors  Other (comment)   Pt very tearful during eval     Posture/Postural Control   Posture/Postural Control  Postural limitations    Postural Limitations  Rounded Shoulders;Forward head      ROM / Strength   AROM / PROM / Strength  AROM;Strength      AROM   Overall AROM Comments  Cervical AROM is WNL    AROM Assessment Site  Shoulder    Right/Left Shoulder  Right;Left    Right Shoulder Extension  54 Degrees    Right Shoulder Flexion  134 Degrees    Right Shoulder ABduction  165 Degrees    Right Shoulder Internal Rotation  68 Degrees    Right Shoulder External Rotation  80 Degrees    Left Shoulder Extension  49 Degrees    Left Shoulder Flexion  145 Degrees    Left Shoulder ABduction  166 Degrees    Left Shoulder Internal Rotation  70 Degrees    Left Shoulder External Rotation  90 Degrees      Strength   Overall Strength   Within functional limits for tasks performed        LYMPHEDEMA/ONCOLOGY QUESTIONNAIRE - 08/22/19 1738      Type   Cancer Type  Right breast cancer      Lymphedema Assessments   Lymphedema Assessments  Upper extremities      Right Upper Extremity Lymphedema   10 cm Proximal to Olecranon Process  23 cm    Olecranon Process  21.8 cm    10 cm Proximal to Ulnar Styloid Process  16.5 cm    Just Proximal to Ulnar Styloid Process  13.3 cm    Across Hand at [employer]  16.9 cm    At [place] of 2nd Digit  5.5 cm      Left Upper Extremity Lymphedema   10 cm Proximal to Olecranon Process  23 cm    Olecranon Process  22.2 cm    10 cm Proximal to Ulnar Styloid Process  16.3 cm    Just Proximal to Ulnar Styloid Process  13 cm    Across Hand at [employer]  17 cm    At [place] of 2nd Digit  5.5 cm          Quick Dash - 08/22/19 0001    Open a tight or new jar  Mild difficulty    Do heavy household chores (wash walls, wash floors)  No difficulty    Carry a shopping bag or briefcase  No difficulty    Wash your back  Moderate difficulty    Use a knife to cut food  No difficulty    Recreational activities in which you take some force or impact through your arm, shoulder, or hand (golf, hammering, tennis)  No difficulty    During the past week, to what extent has your arm, shoulder or hand problem interfered with your normal social activities with family, friends, neighbors, or groups?  Slightly    During the past week, to what extent has your arm, shoulder or hand problem limited your work or other regular daily activities  Not at all    Arm, shoulder, or hand pain.  Mild    Tingling (pins and needles) in your arm, shoulder, or hand  None    Difficulty Sleeping  Mild difficulty    DASH Score  13.64 %        Objective measurements completed on examination: See above findings.      Patient was instructed today in a home exercise program today for post op shoulder range of motion.  These included active assist shoulder flexion in sitting, scapular retraction, wall walking with shoulder abduction, and hands behind head external rotation.  She was encouraged to do these twice a day, holding 3 seconds and repeating 5 times when permitted by her physician.            PT Education - 08/22/19 1739    Education Details  Lymphedema risk reduction and post op shoulder ROM HEP    Person(s) Educated  Patient;Other (comment)   Sister   Methods  Explanation;Demonstration;Handout    Comprehension  Returned demonstration;Verbalized understanding          PT Long Term Goals - 08/22/19 1746      PT LONG TERM GOAL #1   Title  Patient will demonstrate she has regained full shoulder ROM and function post operatively compared to baselines.    Baseline  See objective findings at eval    Time  6    Period  Months    Status  New    Target Date  02/19/20      Breast Clinic Goals - 08/22/19 1744      Patient will be able to verbalize understanding of pertinent lymphedema risk reduction practices relevant to her diagnosis specifically related to skin care.   Baseline  No knowledge    Time  1    Period  Days    Status  Achieved    Target Date  08/22/19      Patient will be able to return demonstrate and/or verbalize understanding of the post-op home exercise program related to regaining shoulder range of motion.   Baseline  No knowledge    Time  1    Period  Days    Status  Achieved    Target Date  08/22/19      Patient will be able to verbalize understanding of the importance of attending the postoperative After Breast Cancer Class for further lymphedema risk reduction education and therapeutic exercise.   Baseline  No knowledge    Time  1    Period  Days    Status  Achieved    Target Date  08/22/19            Plan - 08/22/19 1741    Clinical Impression Statement  Patient was diganosed on 08/14/2019 with right invasive ductal carcinoma breast cancer. It  measures 7.3 cm and is located in various quadrants. It is ER positive, PR negative, and HER2 negative with a Ki67 of 90%. Her multidisciplinary medical team met prior to her assessments to determine a recommended treatment plan. She is planning to have neoadjuvnat chemotherapy followed by a right modified radical mastectomy, radiation, and anti-estrogen therapy. She will benefit from post op PT to reassessment and determine needs.    Personal Factors and Comorbidities  Social Background   Lives alone   Stability/Clinical Decision Making  Stable/Uncomplicated    Clinical Decision Making  Low    Rehab Potential  Good    PT Frequency  --   Eval and 1 post op f/u   PT Treatment/Interventions  ADLs/Self Care Home Management;Therapeutic exercise;Patient/family education    PT Next Visit Plan  Will  reassess 3-4 weeks post op to determine needs    PT Home Exercise Plan  Post op shoulder ROM HEP    Consulted and Agree with Plan of Care  Patient;Family member/caregiver    Family Member Consulted  sister       Patient will benefit from skilled therapeutic intervention in order to improve the following deficits and impairments:  Postural dysfunction, Decreased range of motion, Impaired UE functional use, Pain, Decreased knowledge of precautions  Visit Diagnosis: Malignant neoplasm of overlapping sites of right breast in female, estrogen receptor positive ([place]) - Plan: PT plan of care cert/re-cert  Abnormal posture - Plan: PT plan of care cert/re-cert     Problem List Patient Active Problem List   Diagnosis Date Noted  . Family history of breast cancer   . Family history of prostate cancer   . Family history of colon cancer   . Malignant neoplasm of overlapping sites of right breast in female, estrogen receptor positive ([place]) 08/17/2019  . Unilateral primary osteoarthritis, right knee 01/31/2017   Annia Friendly, PT 08/22/19 5:49 PM  [place], [place], [address] Phone: [phone]   Fax:  [phone]  Name: Gina Ross MRN: [id number] Date of Birth: Aug 31, 1970

## 2019-08-22 NOTE — Progress Notes (Signed)
Clinical Social Work [place] Psychosocial Distress Screening [place]   Patient completed distress screening protocol and scored a 10 on the Psychosocial Distress Thermometer which indicates severe distress. Clinical Social Worker met with patient and patient's sister in [facility] to assess for distress and other psychosocial needs.  Patient stated she was feeling overwhelmed but felt slightly better after meeting with the treatment team and getting more information on her treatment plan.  Still feeling very stressed overall but tired and hungry at the end of clinic.  CSW and patient discussed common feeling and emotions when being diagnosed with cancer, and the importance of support during treatment.  CSW informed patient of the support team and support services at [facility].  CSW provided contact information and encouraged patient to call with any questions or concerns.  Patient is interested in an Network engineer. Referral made. Patient is also already in process for disability referral due to breaking her knee. She has already called her SSA contact and lawyer to let them know of her diagnosis.     Distress Screen: ONCBCN DISTRESS SCREENING 08/22/2019  Screening Type Initial Screening  Distress experienced in past week (1-10) 10  Emotional problem type Nervousness/Anxiety;Adjusting to illness  Referral to clinical social work Yes  Referral to support programs Yes    CSW will continue to follow and check-in on patient periodically throughout treatment to offer emotional and resource support.   [place]

## 2019-08-22 NOTE — Progress Notes (Signed)
START ON PATHWAY REGIMEN - Breast     Cycles 1 through 4: A cycle is every 14 days:     Doxorubicin      Cyclophosphamide      Pegfilgrastim-xxxx    Cycles 5 through 16: A cycle is every 7 days:     Paclitaxel   **Always confirm dose/schedule in your pharmacy ordering system**  Patient Characteristics: Preoperative or Nonsurgical Candidate (Clinical Staging), Neoadjuvant Therapy followed by Surgery, Invasive Disease, Chemotherapy, HER2 Negative/Unknown/Equivocal, ER Positive Therapeutic Status: Preoperative or Nonsurgical Candidate (Clinical Staging) AJCC M Category: cM0 AJCC Grade: G3 Breast Surgical Plan: Neoadjuvant Therapy followed by Surgery ER Status: Positive (+) AJCC 8 Stage Grouping: IIIC HER2 Status: Negative (-) AJCC T Category: cT4b AJCC N Category: cN1 PR Status: Negative (-) Intent of Therapy: Curative Intent, Discussed with Patient

## 2019-08-24 ENCOUNTER — Telehealth: Payer: Self-pay | Admitting: Hematology and Oncology

## 2019-08-24 NOTE — Telephone Encounter (Signed)
Scheduled appt per 2/25 sch message - pt aware of appts added

## 2019-08-28 ENCOUNTER — Encounter: Payer: Self-pay | Admitting: Licensed Clinical Social Worker

## 2019-08-28 ENCOUNTER — Telehealth: Payer: Self-pay | Admitting: *Deleted

## 2019-08-28 NOTE — Telephone Encounter (Signed)
Spoke with patient to follow up from [facility] and assess navigation needs.  Patient states she gets worried how she is going to feel and make it through chemo.  Provided encouragement and referral has been made for an Alight guide as well.  Will have patient and family support follow up with her as well.  She states that she is going to be ok and will just take one day at a time.  Encouraged her to call with any needs or concerns. Patient verbalized understanding of all her upcoming appointments.

## 2019-08-28 NOTE — Progress Notes (Signed)
[place] CSW Progress Note  CSW contacted pt by phone to check in. Patient reports feeling overwhelmed and scared that she will die. Trying to remind herself that she is not alone, many people get through this, and that she has a treatment plan. CSW validated feelings and helped patient identify ways to take each step one at a time.  Patient does receive support from friends, her sister, mom, and son (age 50). 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  Patient relayed need for help with transportation for appointments tomorrow as sister may not be able to drive her. CSW made referral to transportation department.  CSW will continue to check-in and provide emotional support throughout treatment. Will see pt during first infusion appt 3/9.  Edwinna Areola [place] Mar[place] LCSW

## 2019-08-29 ENCOUNTER — Encounter (HOSPITAL_COMMUNITY)
Admission: RE | Admit: 2019-08-29 | Discharge: 2019-08-29 | Disposition: A | Discharge status: Home / Self Care | Payer: Medicaid Other | Source: Ambulatory Visit | Attending: Hematology and Oncology | Admitting: Hematology and Oncology

## 2019-08-29 ENCOUNTER — Other Ambulatory Visit: Payer: Self-pay

## 2019-08-29 ENCOUNTER — Ambulatory Visit (HOSPITAL_COMMUNITY)
Admission: RE | Admit: 2019-08-29 | Discharge: 2019-08-29 | Disposition: A | Discharge status: Home / Self Care | Payer: Medicaid Other | Source: Ambulatory Visit | Attending: Hematology and Oncology | Admitting: Hematology and Oncology

## 2019-08-29 DIAGNOSIS — Z170 Estrogen receptor positive status [ER+]: Secondary | ICD-10-CM | POA: Diagnosis not present

## 2019-08-29 DIAGNOSIS — Z0189 Encounter for other specified special examinations: Secondary | ICD-10-CM | POA: Diagnosis not present

## 2019-08-29 DIAGNOSIS — C50811 Malignant neoplasm of overlapping sites of right female breast: Secondary | ICD-10-CM | POA: Insufficient documentation

## 2019-08-29 MED ORDER — TECHNETIUM TC 99M MEDRONATE IV KIT
18.6000 | PACK | Freq: Once | INTRAVENOUS | Status: AC
  Administered 2019-08-29: 10:00:00 18.6 via INTRAVENOUS

## 2019-08-29 NOTE — Progress Notes (Signed)
  Echocardiogram 2D Echocardiogram has been performed.  Gina Ross M 08/29/2019, 9:41 AM

## 2019-08-30 ENCOUNTER — Encounter: Payer: Self-pay | Admitting: *Deleted

## 2019-08-31 ENCOUNTER — Ambulatory Visit (HOSPITAL_COMMUNITY)
Admission: RE | Admit: 2019-08-31 | Discharge: 2019-08-31 | Disposition: A | Discharge status: Home / Self Care | Payer: Medicaid Other | Source: Ambulatory Visit | Attending: Hematology and Oncology | Admitting: Hematology and Oncology

## 2019-08-31 ENCOUNTER — Other Ambulatory Visit: Payer: Self-pay

## 2019-08-31 DIAGNOSIS — C50811 Malignant neoplasm of overlapping sites of right female breast: Secondary | ICD-10-CM

## 2019-08-31 DIAGNOSIS — Z170 Estrogen receptor positive status [ER+]: Secondary | ICD-10-CM | POA: Insufficient documentation

## 2019-08-31 MED ORDER — IOHEXOL 300 MG/ML  SOLN
100.0000 mL | Freq: Once | INTRAMUSCULAR | Status: AC | PRN
  Administered 2019-08-31: 100 mL via INTRAVENOUS

## 2019-09-02 NOTE — Progress Notes (Signed)
Patient Care Team: Default, Provider, MD as PCP - General [name], RN as Oncology Nurse Navigator [name], RN as Oncology Nurse Navigator [name], MD as Consulting Physician (General Surgery) [name], MD as Consulting Physician (Hematology and Oncology) [name], MD as Consulting Physician (Radiation Oncology)  DIAGNOSIS:    ICD-10-CM   1. Malignant neoplasm of overlapping sites of right breast in female, estrogen receptor positive ([place])  C50.811    Z17.0     SUMMARY OF ONCOLOGIC HISTORY: Oncology History  Malignant neoplasm of overlapping sites of right breast in female, estrogen receptor positive ([place])  08/17/2019 Initial Diagnosis   Patient palpated a right breast mass and right axilla mass and noted skin redness x2 weeks. She went to the ED and had an abscess drained with no improvement. Diagnostic mammogram and US showed a 7.3cm right breast mass extending into all 4 quadrants and partially into overlying skin with 12 abnormal right axillary lymph nodes. Biopsy  showed IDC with necrosis in the breast and axilla, grade 3, HER-2 equivocal by IHC, negative by FISH, ER+ 40% weak, PR -, Ki67 90%.    09/04/2019 -  Chemotherapy   The patient had DOXOrubicin (ADRIAMYCIN) chemo injection 110 mg, 60 mg/m2 = 110 mg, Intravenous,  Once, 0 of 4 cycles palonosetron (ALOXI) injection 0.25 mg, 0.25 mg, Intravenous,  Once, 0 of 4 cycles pegfilgrastim-jmdb (FULPHILA) injection 6 mg, 6 mg, Subcutaneous,  Once, 0 of 4 cycles cyclophosphamide (CYTOXAN) 1,100 mg in sodium chloride 0.9 % 250 mL chemo infusion, 600 mg/m2 = 1,100 mg, Intravenous,  Once, 0 of 4 cycles PACLitaxel (TAXOL) 144 mg in sodium chloride 0.9 % 250 mL chemo infusion (</= 55m/m2), 80 mg/m2 = 144 mg, Intravenous,  Once, 0 of 12 cycles fosaprepitant (EMEND) 150 mg in sodium chloride 0.9 % 145 mL IVPB, 150 mg, Intravenous,  Once, 0 of 4 cycles  for chemotherapy treatment.      CHIEF COMPLIANT:  Cycle 1 Adriamycin and Cytoxan will be started tomorrow  INTERVAL HISTORY: Gina LOSADAis a 49y.o. with above-mentioned history of right breast cancer currently on neoadjuvant chemotherapy with dose dense Adriamycin and Cytoxan. Echo on 08/29/19 showed an ejection fraction of 60-65%. Bone scan and CT CAP on 08/29/19 showed no evidence of metastatic disease. She presents to the clinic today for cycle 1.  She is extremely anxious today. Or she starts chemotherapy.  ALLERGIES:  is allergic to penicillins.  MEDICATIONS:  Current Outpatient Medications  Medication Sig Dispense Refill  . acetaminophen (TYLENOL) 325 MG tablet Take 650 mg by mouth every 6 (six) hours as needed.    .Marland Kitchenibuprofen (ADVIL) 800 MG tablet Take 1 tablet (800 mg total) by mouth 3 (three) times daily. 21 tablet 0  . lidocaine-prilocaine (EMLA) cream Apply to affected area once 30 g 3  . LORazepam (ATIVAN) 0.5 MG tablet Take 1 tablet (0.5 mg total) by mouth at bedtime as needed (Nausea or vomiting). 30 tablet 0  . ondansetron (ZOFRAN) 8 MG tablet Take 1 tablet (8 mg total) by mouth 2 (two) times daily as needed. Start on the third day after chemotherapy. 30 tablet 1  . prochlorperazine (COMPAZINE) 10 MG tablet Take 1 tablet (10 mg total) by mouth every 6 (six) hours as needed (Nausea or vomiting). 30 tablet 1  . traMADol (ULTRAM) 50 MG tablet Take 1 tablet (50 mg total) by mouth every 6 (six) hours as needed. 12 tablet 0   No current facility-administered  medications for this visit.    PHYSICAL EXAMINATION: ECOG PERFORMANCE STATUS: 1 - Symptomatic but completely ambulatory  Vitals:   09/03/19 1120  BP: (!) 161/113  Pulse: 95  Resp: 18  Temp: 98.2 F (36.8 C)  SpO2: 98%   There were no vitals filed for this visit.  LABORATORY DATA:  I have reviewed the data as listed CMP Latest Ref Rng & Units 08/22/2019 09/03/2014 03/22/2014  Glucose 70 - 99 mg/dL 104(H) 99 87  BUN 6 - 20 mg/dL _0 Creatinine 0.44 - 1.00 mg/dL  0.86 0.78 0.76  Sodium 135 - 145 mmol/L 141 138 137  Potassium 3.5 - 5.1 mmol/L 3.4(L) 4.0 4.1  Chloride 98 - 111 mmol/L 103 106 100  CO2 22 - 32 mmol/L _1 Calcium 8.9 - 10.3 mg/dL 8.9 9.0 9.0  Total Protein 6.5 - 8.1 g/dL 7.8 7.5 7.7  Total Bilirubin 0.3 - 1.2 mg/dL 0.2(L) 0.4 0.3  Alkaline Phos 38 - 126 U/L 59 61 49  AST 15 - 41 U/L _2 ALT 0 - 44 U/L _3 Lab Results  Component Value Date   WBC 3.9 (L) 08/22/2019   HGB 12.1 08/22/2019   HCT 34.7 (L) 08/22/2019   MCV 88.3 08/22/2019   PLT 240 08/22/2019   NEUTROABS 1.9 08/22/2019    ASSESSMENT & PLAN:  Malignant neoplasm of overlapping sites of right breast in female, estrogen receptor positive ([place]) 08/17/19: Patient palpated a right breast mass and right axilla mass and noted skin redness x2 weeks. She went to the ED and had an abscess drained with no improvement. Diagnostic mammogram and US showed a 7.3cm right breast mass extending into all 4 quadrants and partially into overlying skin with 12 abnormal right axillary lymph nodes. Biopsy  showed IDC with necrosis in the breast and axilla, grade 3, HER-2 equivocal by IHC, negative by FISH, ER+ 40% weak, PR -, Ki67 90%.  Treatment plan: 1. Neoadjuvant chemotherapy with Adriamycin and Cytoxan dose dense 4 followed by Taxol weekly 12 2. Followed by breast mastectomy and axillary lymph node dissection 3. Followed by adjuvant radiation therapy 4.  Follow-up adjuvant antiestrogen therapy  CT CAP 08/31/2019: Very large central right breast mass, bulky right axillary and subpectoral lymph nodes.  No evidence of distant metastatic disease.  Bulky uterine fibroids Bone scan 08/31/2019: No metastatic disease in the bones ---------------------------------------------------------------------------------------------------------------------------------------------------- Current treatment: Cycle 1 day 1 dose dense Adriamycin and Cytoxan will be tomorrow Echo: 08/29/2019: EF  60-65% She is relieved to hear that the CT scans do not show any evidence of metastatic disease. Goal of treatment: Cure Labs reviewed, antiemetics reviewed Chemo education completed and chemo consent obtained Return to clinic in 1 week for toxicity check.    No orders of the defined types were placed in this encounter.  The patient has a good understanding of the overall plan. she agrees with it. she will call with any problems that may develop before the next visit here.  Total time spent: 30 mins including face to face time and time spent for planning, charting and coordination of care  [name], MD 09/03/2019  I, Cloyde Reams Dorsh[place], am acting as scribe for Dr. [name].  I have reviewed the above documentation for accuracy and completeness, and I agree with the above.

## 2019-09-03 ENCOUNTER — Inpatient Hospital Stay (HOSPITAL_BASED_OUTPATIENT_CLINIC_OR_DEPARTMENT_OTHER): Payer: Medicaid Other | Admitting: Hematology and Oncology

## 2019-09-03 ENCOUNTER — Ambulatory Visit (HOSPITAL_COMMUNITY)
Admission: RE | Admit: 2019-09-03 | Discharge: 2019-09-03 | Disposition: A | Discharge status: Home / Self Care | Payer: Medicaid Other | Source: Ambulatory Visit | Attending: Hematology and Oncology | Admitting: Hematology and Oncology

## 2019-09-03 ENCOUNTER — Inpatient Hospital Stay: Payer: Medicaid Other | Attending: Hematology and Oncology

## 2019-09-03 ENCOUNTER — Telehealth: Payer: Self-pay | Admitting: Hematology and Oncology

## 2019-09-03 ENCOUNTER — Encounter: Payer: Self-pay | Admitting: *Deleted

## 2019-09-03 ENCOUNTER — Other Ambulatory Visit: Payer: Self-pay

## 2019-09-03 ENCOUNTER — Other Ambulatory Visit: Payer: Self-pay | Admitting: Radiology

## 2019-09-03 DIAGNOSIS — Z8042 Family history of malignant neoplasm of prostate: Secondary | ICD-10-CM | POA: Insufficient documentation

## 2019-09-03 DIAGNOSIS — Z5111 Encounter for antineoplastic chemotherapy: Secondary | ICD-10-CM | POA: Insufficient documentation

## 2019-09-03 DIAGNOSIS — R234 Changes in skin texture: Secondary | ICD-10-CM | POA: Diagnosis not present

## 2019-09-03 DIAGNOSIS — C778 Secondary and unspecified malignant neoplasm of lymph nodes of multiple regions: Secondary | ICD-10-CM | POA: Insufficient documentation

## 2019-09-03 DIAGNOSIS — C50811 Malignant neoplasm of overlapping sites of right female breast: Secondary | ICD-10-CM

## 2019-09-03 DIAGNOSIS — Z880 Allergy status to penicillin: Secondary | ICD-10-CM | POA: Diagnosis not present

## 2019-09-03 DIAGNOSIS — Z170 Estrogen receptor positive status [ER+]: Secondary | ICD-10-CM | POA: Diagnosis not present

## 2019-09-03 DIAGNOSIS — N6325 Unspecified lump in the left breast, overlapping quadrants: Secondary | ICD-10-CM | POA: Diagnosis not present

## 2019-09-03 DIAGNOSIS — Z803 Family history of malignant neoplasm of breast: Secondary | ICD-10-CM | POA: Insufficient documentation

## 2019-09-03 DIAGNOSIS — F17210 Nicotine dependence, cigarettes, uncomplicated: Secondary | ICD-10-CM | POA: Insufficient documentation

## 2019-09-03 DIAGNOSIS — Z8049 Family history of malignant neoplasm of other genital organs: Secondary | ICD-10-CM | POA: Insufficient documentation

## 2019-09-03 DIAGNOSIS — Z800 Family history of malignant neoplasm of digestive organs: Secondary | ICD-10-CM | POA: Diagnosis not present

## 2019-09-03 DIAGNOSIS — Z5189 Encounter for other specified aftercare: Secondary | ICD-10-CM | POA: Insufficient documentation

## 2019-09-03 MED ORDER — GADOBUTROL 1 MMOL/ML IV SOLN
6.0000 mL | Freq: Once | INTRAVENOUS | Status: AC | PRN
  Administered 2019-09-03: 6 mL via INTRAVENOUS

## 2019-09-03 NOTE — Assessment & Plan Note (Signed)
08/17/19: Patient palpated a right breast mass and right axilla mass and noted skin redness x2 weeks. She went to the ED and had an abscess drained with no improvement. Diagnostic mammogram and US showed a 7.3cm right breast mass extending into all 4 quadrants and partially into overlying skin with 12 abnormal right axillary lymph nodes. Biopsy  showed IDC with necrosis in the breast and axilla, grade 3, HER-2 equivocal by IHC, negative by FISH, ER+ 40% weak, PR -, Ki67 90%.  Treatment plan: 1. Neoadjuvant chemotherapy with Adriamycin and Cytoxan dose dense 4 followed by Taxol weekly 12 2. Followed by breast mastectomy and axillary lymph node dissection 3. Followed by adjuvant radiation therapy 4.  Follow-up adjuvant antiestrogen therapy  CT CAP 08/31/2019: Very large central right breast mass, bulky right axillary and subpectoral lymph nodes.  No evidence of distant metastatic disease.  Bulky uterine fibroids Bone scan 08/31/2019: No metastatic disease in the bones ---------------------------------------------------------------------------------------------------------------------------------------------------- Current treatment: Cycle 1 day 1 dose dense Adriamycin and Cytoxan Goal of treatment: Cure Labs reviewed, antiemetics reviewed Chemo education completed and chemo consent obtained Return to clinic in 1 week for toxicity check.

## 2019-09-03 NOTE — Telephone Encounter (Signed)
I talk with patient regarding schedule  

## 2019-09-04 ENCOUNTER — Other Ambulatory Visit: Payer: Self-pay | Admitting: *Deleted

## 2019-09-04 ENCOUNTER — Ambulatory Visit (HOSPITAL_COMMUNITY)
Admission: RE | Admit: 2019-09-04 | Discharge: 2019-09-04 | Disposition: A | Discharge status: Home / Self Care | Payer: Medicaid Other | Source: Ambulatory Visit | Attending: Hematology and Oncology | Admitting: Hematology and Oncology

## 2019-09-04 ENCOUNTER — Inpatient Hospital Stay: Payer: Medicaid Other

## 2019-09-04 ENCOUNTER — Inpatient Hospital Stay: Payer: Medicaid Other | Admitting: Licensed Clinical Social Worker

## 2019-09-04 ENCOUNTER — Encounter (HOSPITAL_COMMUNITY): Payer: Self-pay

## 2019-09-04 ENCOUNTER — Other Ambulatory Visit: Payer: Self-pay | Admitting: Hematology and Oncology

## 2019-09-04 VITALS — BP 125/90 | HR 75 | Temp 98.5°F | Resp 17

## 2019-09-04 DIAGNOSIS — Z170 Estrogen receptor positive status [ER+]: Secondary | ICD-10-CM

## 2019-09-04 DIAGNOSIS — R928 Other abnormal and inconclusive findings on diagnostic imaging of breast: Secondary | ICD-10-CM

## 2019-09-04 DIAGNOSIS — C50811 Malignant neoplasm of overlapping sites of right female breast: Secondary | ICD-10-CM

## 2019-09-04 DIAGNOSIS — Z5111 Encounter for antineoplastic chemotherapy: Secondary | ICD-10-CM | POA: Diagnosis not present

## 2019-09-04 HISTORY — PX: IR IMAGING GUIDED PORT INSERTION: IMG5740

## 2019-09-04 LAB — COMPREHENSIVE METABOLIC PANEL
ALT: 17 U/L (ref 0–44)
AST: 24 U/L (ref 15–41)
Albumin: 4.3 g/dL (ref 3.5–5.0)
Alkaline Phosphatase: 42 U/L (ref 38–126)
Anion gap: 10 (ref 5–15)
BUN: 8 mg/dL (ref 6–20)
CO2: 23 mmol/L (ref 22–32)
Calcium: 9.1 mg/dL (ref 8.9–10.3)
Chloride: 107 mmol/L (ref 98–111)
Creatinine, Ser: 0.63 mg/dL (ref 0.44–1.00)
GFR calc Af Amer: >60 mL/min (ref >60)
GFR calc non Af Amer: >60 mL/min (ref >60)
Glucose, Bld: 86 mg/dL (ref 70–99)
Potassium: 3.6 mmol/L (ref 3.5–5.1)
Sodium: 140 mmol/L (ref 135–145)
Total Bilirubin: 0.8 mg/dL (ref 0.3–1.2)
Total Protein: 8.2 g/dL — ABNORMAL HIGH (ref 6.5–8.1)

## 2019-09-04 LAB — CBC WITH DIFFERENTIAL/PLATELET
Abs Immature Granulocytes: 0.01 10*3/uL (ref 0.00–0.07)
Basophils Absolute: 0 10*3/uL (ref 0.0–0.1)
Basophils Relative: 0 %
Eosinophils Absolute: 0.1 10*3/uL (ref 0.0–0.5)
Eosinophils Relative: 2 %
HCT: 37.1 % (ref 36.0–46.0)
Hemoglobin: 12.6 g/dL (ref 12.0–15.0)
Immature Granulocytes: 0 %
Lymphocytes Relative: 36 %
Lymphs Abs: 1.3 10*3/uL (ref 0.7–4.0)
MCH: 30.6 pg (ref 26.0–34.0)
MCHC: 34 g/dL (ref 30.0–36.0)
MCV: 90 fL (ref 80.0–100.0)
Monocytes Absolute: 0.3 10*3/uL (ref 0.1–1.0)
Monocytes Relative: 9 %
Neutro Abs: 1.9 10*3/uL (ref 1.7–7.7)
Neutrophils Relative %: 53 %
Platelets: 265 10*3/uL (ref 150–400)
RBC: 4.12 MIL/uL (ref 3.87–5.11)
RDW: 11.9 % (ref 11.5–15.5)
WBC: 3.5 10*3/uL — ABNORMAL LOW (ref 4.0–10.5)
nRBC: 0 % (ref 0.0–0.2)

## 2019-09-04 LAB — PROTIME-INR
INR: 1 (ref 0.8–1.2)
Prothrombin Time: 12.7 seconds (ref 11.4–15.2)

## 2019-09-04 MED ORDER — MIDAZOLAM HCL 2 MG/2ML IJ SOLN
INTRAMUSCULAR | Status: AC
  Filled 2019-09-04: qty 4

## 2019-09-04 MED ORDER — SODIUM CHLORIDE 0.9 % IV SOLN
150.0000 mg | Freq: Once | INTRAVENOUS | Status: AC
  Administered 2019-09-04: 150 mg via INTRAVENOUS
  Filled 2019-09-04: qty 150

## 2019-09-04 MED ORDER — CLINDAMYCIN PHOSPHATE 900 MG/50ML IV SOLN
INTRAVENOUS | Status: AC
  Administered 2019-09-04: 12:00:00 900 mg via INTRAVENOUS
  Filled 2019-09-04: qty 50

## 2019-09-04 MED ORDER — SODIUM CHLORIDE 0.9 % IV SOLN
Freq: Once | INTRAVENOUS | Status: AC
  Filled 2019-09-04: qty 250

## 2019-09-04 MED ORDER — SODIUM CHLORIDE 0.9 % IV SOLN: INTRAVENOUS | Status: DC

## 2019-09-04 MED ORDER — PALONOSETRON HCL INJECTION 0.25 MG/5ML
INTRAVENOUS | Status: AC
  Filled 2019-09-04: qty 5

## 2019-09-04 MED ORDER — LIDOCAINE-EPINEPHRINE 1 %-1:100000 IJ SOLN
INTRAMUSCULAR | Status: AC
  Filled 2019-09-04: qty 1

## 2019-09-04 MED ORDER — DEXAMETHASONE SODIUM PHOSPHATE 10 MG/ML IJ SOLN
INTRAMUSCULAR | Status: AC
  Filled 2019-09-04: qty 1

## 2019-09-04 MED ORDER — DOXORUBICIN HCL CHEMO IV INJECTION 2 MG/ML
60.0000 mg/m2 | Freq: Once | INTRAVENOUS | Status: AC
  Administered 2019-09-04: 110 mg via INTRAVENOUS
  Filled 2019-09-04: qty 55

## 2019-09-04 MED ORDER — CLINDAMYCIN PHOSPHATE 900 MG/50ML IV SOLN: 900.0000 mg | Freq: Once | INTRAVENOUS | Status: AC

## 2019-09-04 MED ORDER — HEPARIN SOD (PORK) LOCK FLUSH 100 UNIT/ML IV SOLN
500.0000 [IU] | Freq: Once | INTRAVENOUS | Status: AC | PRN
  Administered 2019-09-04: 17:00:00 500 [IU]
  Filled 2019-09-04: qty 5

## 2019-09-04 MED ORDER — FENTANYL CITRATE (PF) 100 MCG/2ML IJ SOLN
INTRAMUSCULAR | Status: AC | PRN
  Administered 2019-09-04 (×4): 50 ug via INTRAVENOUS

## 2019-09-04 MED ORDER — SODIUM CHLORIDE 0.9% FLUSH
10.0000 mL | INTRAVENOUS | Status: DC | PRN
  Administered 2019-09-04: 10 mL
  Filled 2019-09-04: qty 10

## 2019-09-04 MED ORDER — PALONOSETRON HCL INJECTION 0.25 MG/5ML
0.2500 mg | Freq: Once | INTRAVENOUS | Status: AC
  Administered 2019-09-04: 15:00:00 0.25 mg via INTRAVENOUS

## 2019-09-04 MED ORDER — SODIUM CHLORIDE 0.9 % IV SOLN: 10.0000 mg | Freq: Once | INTRAVENOUS | Status: DC

## 2019-09-04 MED ORDER — FENTANYL CITRATE (PF) 100 MCG/2ML IJ SOLN
INTRAMUSCULAR | Status: AC
  Filled 2019-09-04: qty 2

## 2019-09-04 MED ORDER — SODIUM CHLORIDE 0.9 % IV SOLN
600.0000 mg/m2 | Freq: Once | INTRAVENOUS | Status: AC
  Administered 2019-09-04: 1100 mg via INTRAVENOUS
  Filled 2019-09-04: qty 55

## 2019-09-04 MED ORDER — DEXAMETHASONE SODIUM PHOSPHATE 10 MG/ML IJ SOLN
10.0000 mg | Freq: Once | INTRAMUSCULAR | Status: AC
  Administered 2019-09-04: 15:00:00 10 mg via INTRAVENOUS

## 2019-09-04 MED ORDER — HEPARIN SOD (PORK) LOCK FLUSH 100 UNIT/ML IV SOLN
INTRAVENOUS | Status: AC
  Filled 2019-09-04: qty 5

## 2019-09-04 MED ORDER — LIDOCAINE HCL (PF) 1 % IJ SOLN
INTRAMUSCULAR | Status: AC | PRN
  Administered 2019-09-04 (×2): 10 mL via INTRADERMAL

## 2019-09-04 MED ORDER — MIDAZOLAM HCL 2 MG/2ML IJ SOLN
INTRAMUSCULAR | Status: AC | PRN
  Administered 2019-09-04 (×4): 1 mg via INTRAVENOUS

## 2019-09-04 NOTE — Patient Instructions (Signed)
[facility] Discharge Instructions for Patients Receiving Chemotherapy  Today you received the following chemotherapy agents: Adriamycin, Cytoxin   To help prevent nausea and vomiting after your treatment, we encourage you to take your nausea medication as directed.    If you develop nausea and vomiting that is not controlled by your nausea medication, call the clinic.   BELOW ARE SYMPTOMS THAT SHOULD BE REPORTED IMMEDIATELY:  *FEVER GREATER THAN 100.5 F  *CHILLS WITH OR WITHOUT FEVER  NAUSEA AND VOMITING THAT IS NOT CONTROLLED WITH YOUR NAUSEA MEDICATION  *UNUSUAL SHORTNESS OF BREATH  *UNUSUAL BRUISING OR BLEEDING  TENDERNESS IN MOUTH AND THROAT WITH OR WITHOUT PRESENCE OF ULCERS  *URINARY PROBLEMS  *BOWEL PROBLEMS  UNUSUAL RASH Items with * indicate a potential emergency and should be followed up as soon as possible.  Feel free to call the clinic should you have any questions or concerns. The clinic phone number is [phone].  Please show the [facility] at check-in to the Emergency Department and triage nurse.  Doxorubicin injection What is this medicine? DOXORUBICIN (dox oh ROO bi sin) is a chemotherapy drug. It is used to treat many kinds of cancer like leukemia, lymphoma, neuroblastoma, sarcoma, and Wilms' tumor. It is also used to treat bladder cancer, breast cancer, lung cancer, ovarian cancer, stomach cancer, and thyroid cancer. This medicine may be used for other purposes; ask your health care provider or pharmacist if you have questions. COMMON BRAND NAME(S): Adriamycin, Adriamycin PFS, Adriamycin RDF, Rubex What should I tell my health care provider before I take this medicine? They need to know if you have any of these conditions:  heart disease  history of low blood counts caused by a medicine  liver disease  recent or ongoing radiation therapy  an unusual or allergic reaction to doxorubicin, other chemotherapy agents, other  medicines, foods, dyes, or preservatives  pregnant or trying to get pregnant  breast-feeding How should I use this medicine? This drug is given as an infusion into a vein. It is administered in a hospital or clinic by a specially trained health care professional. If you have pain, swelling, burning or any unusual feeling around the site of your injection, tell your health care professional right away. Talk to your pediatrician regarding the use of this medicine in children. Special care may be needed. Overdosage: If you think you have taken too much of this medicine contact a poison control center or emergency room at once. NOTE: This medicine is only for you. Do not share this medicine with others. What if I miss a dose? It is important not to miss your dose. Call your doctor or health care professional if you are unable to keep an appointment. What may interact with this medicine? This medicine may interact with the following medications:  6-mercaptopurine  paclitaxel  phenytoin  St. John's Wort  trastuzumab  verapamil This list may not describe all possible interactions. Give your health care provider a list of all the medicines, herbs, non-prescription drugs, or dietary supplements you use. Also tell them if you smoke, drink alcohol, or use illegal drugs. Some items may interact with your medicine. What should I watch for while using this medicine? This drug may make you feel generally unwell. This is not uncommon, as chemotherapy can affect healthy cells as well as cancer cells. Report any side effects. Continue your course of treatment even though you feel ill unless your doctor tells you to stop. There is a maximum  amount of this medicine you should receive throughout your life. The amount depends on the medical condition being treated and your overall health. Your doctor will watch how much of this medicine you receive in your lifetime. Tell your doctor if you have taken this  medicine before. You may need blood work done while you are taking this medicine. Your urine may turn red for a few days after your dose. This is not blood. If your urine is dark or brown, call your doctor. In some cases, you may be given additional medicines to help with side effects. Follow all directions for their use. Call your doctor or health care professional for advice if you get a fever, chills or sore throat, or other symptoms of a cold or flu. Do not treat yourself. This drug decreases your body's ability to fight infections. Try to avoid being around people who are sick. This medicine may increase your risk to bruise or bleed. Call your doctor or health care professional if you notice any unusual bleeding. Talk to your doctor about your risk of cancer. You may be more at risk for certain types of cancers if you take this medicine. Do not become pregnant while taking this medicine or for 6 months after stopping it. Women should inform their doctor if they wish to become pregnant or think they might be pregnant. Men should not father a child while taking this medicine and for 6 months after stopping it. There is a potential for serious side effects to an unborn child. Talk to your health care professional or pharmacist for more information. Do not breast-feed an infant while taking this medicine. This medicine has caused ovarian failure in some women and reduced sperm counts in some men This medicine may interfere with the ability to have a child. Talk with your doctor or health care professional if you are concerned about your fertility. This medicine may cause a decrease in Co-Enzyme Q-10. You should make sure that you get enough Co-Enzyme Q-10 while you are taking this medicine. Discuss the foods you eat and the vitamins you take with your health care professional. What side effects may I notice from receiving this medicine? Side effects that you should report to your doctor or health care  professional as soon as possible:  allergic reactions like skin rash, itching or hives, swelling of the face, lips, or tongue  breathing problems  chest pain  fast or irregular heartbeat  low blood counts - this medicine may decrease the number of white blood cells, red blood cells and platelets. You may be at increased risk for infections and bleeding.  pain, redness, or irritation at site where injected  signs of infection - fever or chills, cough, sore throat, pain or difficulty passing urine  signs of decreased platelets or bleeding - bruising, pinpoint red spots on the skin, black, tarry stools, blood in the urine  swelling of the ankles, feet, hands  tiredness  weakness Side effects that usually do not require medical attention (report to your doctor or health care professional if they continue or are bothersome):  diarrhea  hair loss  mouth sores  nail discoloration or damage  nausea  red colored urine  vomiting This list may not describe all possible side effects. Call your doctor for medical advice about side effects. You may report side effects to FDA at 1-800-FDA-1088. Where should I keep my medicine? This drug is given in a hospital or clinic and will not be stored at  home. NOTE: This sheet is a summary. It may not cover all possible information. If you have questions about this medicine, talk to your doctor, pharmacist, or health care provider.  2020 Elsevier/Gold Standard (2017-01-26 11:01:26)  Cyclophosphamide Injection What is this medicine? CYCLOPHOSPHAMIDE (sye kloe FOSS fa mide) is a chemotherapy drug. It slows the growth of cancer cells. This medicine is used to treat many types of cancer like lymphoma, myeloma, leukemia, breast cancer, and ovarian cancer, to name a few. This medicine may be used for other purposes; ask your health care provider or pharmacist if you have questions. COMMON BRAND NAME(S): Cytoxan, Neosar What should I tell my health  care provider before I take this medicine? They need to know if you have any of these conditions:  heart disease  history of irregular heartbeat  infection  kidney disease  liver disease  low blood counts, like white cells, platelets, or red blood cells  on hemodialysis  recent or ongoing radiation therapy  scarring or thickening of the lungs  trouble passing urine  an unusual or allergic reaction to cyclophosphamide, other medicines, foods, dyes, or preservatives  pregnant or trying to get pregnant  breast-feeding How should I use this medicine? This drug is usually given as an injection into a vein or muscle or by infusion into a vein. It is administered in a hospital or clinic by a specially trained health care professional. Talk to your pediatrician regarding the use of this medicine in children. Special care may be needed. Overdosage: If you think you have taken too much of this medicine contact a poison control center or emergency room at once. NOTE: This medicine is only for you. Do not share this medicine with others. What if I miss a dose? It is important not to miss your dose. Call your doctor or health care professional if you are unable to keep an appointment. What may interact with this medicine?  amphotericin B  azathioprine  certain antivirals for HIV or hepatitis  certain medicines for blood pressure, heart disease, irregular heart beat  certain medicines that treat or prevent blood clots like warfarin  certain other medicines for cancer  cyclosporine  etanercept  indomethacin  medicines that relax muscles for surgery  medicines to increase blood counts  metronidazole This list may not describe all possible interactions. Give your health care provider a list of all the medicines, herbs, non-prescription drugs, or dietary supplements you use. Also tell them if you smoke, drink alcohol, or use illegal drugs. Some items may interact with your  medicine. What should I watch for while using this medicine? Your condition will be monitored carefully while you are receiving this medicine. You may need blood work done while you are taking this medicine. Drink water or other fluids as directed. Urinate often, even at night. Some products may contain alcohol. Ask your health care professional if this medicine contains alcohol. Be sure to tell all health care professionals you are taking this medicine. Certain medicines, like metronidazole and disulfiram, can cause an unpleasant reaction when taken with alcohol. The reaction includes flushing, headache, nausea, vomiting, sweating, and increased thirst. The reaction can last from 30 minutes to several hours. Do not become pregnant while taking this medicine or for 1 year after stopping it. Women should inform their health care professional if they wish to become pregnant or think they might be pregnant. Men should not father a child while taking this medicine and for 4 months after stopping it. There  is potential for serious side effects to an unborn child. Talk to your health care professional for more information. Do not breast-feed an infant while taking this medicine or for 1 week after stopping it. This medicine has caused ovarian failure in some women. This medicine may make it more difficult to get pregnant. Talk to your health care professional if you are concerned about your fertility. This medicine has caused decreased sperm counts in some men. This may make it more difficult to father a child. Talk to your health care professional if you are concerned about your fertility. Call your health care professional for advice if you get a fever, chills, or sore throat, or other symptoms of a cold or flu. Do not treat yourself. This medicine decreases your body's ability to fight infections. Try to avoid being around people who are sick. Avoid taking medicines that contain aspirin, acetaminophen,  ibuprofen, naproxen, or ketoprofen unless instructed by your health care professional. These medicines may hide a fever. Talk to your health care professional about your risk of cancer. You may be more at risk for certain types of cancer if you take this medicine. If you are going to need surgery or other procedure, tell your health care professional that you are using this medicine. Be careful brushing or flossing your teeth or using a toothpick because you may get an infection or bleed more easily. If you have any dental work done, tell your dentist you are receiving this medicine. What side effects may I notice from receiving this medicine? Side effects that you should report to your doctor or health care professional as soon as possible:  allergic reactions like skin rash, itching or hives, swelling of the face, lips, or tongue  breathing problems  nausea, vomiting  signs and symptoms of bleeding such as bloody or black, tarry stools; red or dark brown urine; spitting up blood or brown material that looks like coffee grounds; red spots on the skin; unusual bruising or bleeding from the eyes, gums, or nose  signs and symptoms of heart failure like fast, irregular heartbeat, sudden weight gain; swelling of the ankles, feet, hands  signs and symptoms of infection like fever; chills; cough; sore throat; pain or trouble passing urine  signs and symptoms of kidney injury like trouble passing urine or change in the amount of urine  signs and symptoms of liver injury like dark yellow or brown urine; general ill feeling or flu-like symptoms; light-colored stools; loss of appetite; nausea; right upper belly pain; unusually weak or tired; yellowing of the eyes or skin Side effects that usually do not require medical attention (report to your doctor or health care professional if they continue or are bothersome):  confusion  decreased hearing  diarrhea  facial flushing  hair  loss  headache  loss of appetite  missed menstrual periods  signs and symptoms of low red blood cells or anemia such as unusually weak or tired; feeling faint or lightheaded; falls  skin discoloration This list may not describe all possible side effects. Call your doctor for medical advice about side effects. You may report side effects to FDA at 1-800-FDA-1088. Where should I keep my medicine? This drug is given in a hospital or clinic and will not be stored at home. NOTE: This sheet is a summary. It may not cover all possible information. If you have questions about this medicine, talk to your doctor, pharmacist, or health care provider.  2020 Elsevier/Gold Standard (2019-03-19 09:53:29)

## 2019-09-04 NOTE — Discharge Instructions (Signed)
Urgent Needs - IR MD on call [phone]  Wound - may remove dressing and shower in 24 to 48 hours.  Keep dressing clean and dry.  May replace dressing with bandaid. Do not submerge in tub / water until healed EMLA cream - may start when incision is healing well  After treatments complete, your provider should have you set up for monthly port flushes  Moderate Conscious Sedation, Adult, Care After These instructions provide you with information about caring for yourself after your procedure. Your health care provider may also give you more specific instructions. Your treatment has been planned according to current medical practices, but problems sometimes occur. Call your health care provider if you have any problems or questions after your procedure. What can I expect after the procedure? After your procedure, it is common:  To feel sleepy for several hours.  To feel clumsy and have poor balance for several hours.  To have poor judgment for several hours.  To vomit if you eat too soon. Follow these instructions at home: For at least 24 hours after the procedure:  Do not: ? Participate in activities where you could fall or become injured. ? Drive. ? Use heavy machinery. ? Drink alcohol. ? Take sleeping pills or medicines that cause drowsiness. ? Make important decisions or sign legal documents. ? Take care of children on your own.  Rest. Eating and drinking  Follow the diet recommended by your health care provider.  If you vomit: ? Drink water, juice, or soup when you can drink without vomiting. ? Make sure you have little or no nausea before eating solid foods. General instructions  Have a responsible adult stay with you until you are awake and alert.  Take over-the-counter and prescription medicines only as told by your health care provider.  If you smoke, do not smoke without supervision.  Keep all follow-up visits as told by your health care provider. This is  important. Contact a health care provider if:  You keep feeling nauseous or you keep vomiting.  You feel light-headed.  You develop a rash.  You have a fever. Get help right away if:  You have trouble breathing. This information is not intended to replace advice given to you by your health care provider. Make sure you discuss any questions you have with your health care provider. Document Revised: 05/27/2017 Document Reviewed: 10/04/2015 Elsevier Patient Education  [place] Insertion, Care After This sheet gives you information about how to care for yourself after your procedure. Your health care provider may also give you more specific instructions. If you have problems or questions, contact your health care provider. What can I expect after the procedure? After the procedure, it is common to have:  Discomfort at the port insertion site.  Bruising on the skin over the port. This should improve over 3-4 days. Follow these instructions at home: [facility] care  After your port is placed, you will get a manufacturer's information card. The card has information about your port. Keep this card with you at all times.  Take care of the port as told by your health care provider. Ask your health care provider if you or a family member can get training for taking care of the port at home. A home health care nurse may also take care of the port.  Make sure to remember what type of port you have. Incision care  Follow instructions from your health care provider about how to  take care of your port insertion site. Make sure you: ? Wash your hands with soap and water before and after you change your bandage (dressing). If soap and water are not available, use hand sanitizer. ? Change your dressing as told by your health care provider. ? Leave stitches (sutures), skin glue, or adhesive strips in place. These skin closures may need to stay in place for 2 weeks or longer.  If adhesive strip edges start to loosen and curl up, you may trim the loose edges. Do not remove adhesive strips completely unless your health care provider tells you to do that.  Check your port insertion site every day for signs of infection. Check for: ? Redness, swelling, or pain. ? Fluid or blood. ? Warmth. ? Pus or a bad smell. Activity  Return to your normal activities as told by your health care provider. Ask your health care provider what activities are safe for you.  Do not lift anything that is heavier than 10 lb (4.5 kg), or the limit that you are told, until your health care provider says that it is safe. General instructions  Take over-the-counter and prescription medicines only as told by your health care provider.  Do not take baths, swim, or use a hot tub until your health care provider approves. Ask your health care provider if you may take showers. You may only be allowed to take sponge baths.  Do not drive for 24 hours if you were given a sedative during your procedure.  Wear a medical alert bracelet in case of an emergency. This will tell any health care providers that you have a port.  Keep all follow-up visits as told by your health care provider. This is important. Contact a health care provider if:  You cannot flush your port with saline as directed, or you cannot draw blood from the port.  You have a fever or chills.  You have redness, swelling, or pain around your port insertion site.  You have fluid or blood coming from your port insertion site.  Your port insertion site feels warm to the touch.  You have pus or a bad smell coming from the port insertion site. Get help right away if:  You have chest pain or shortness of breath.  You have bleeding from your port that you cannot control. Summary  Take care of the port as told by your health care provider. Keep the manufacturer's information card with you at all times.  Change your dressing as  told by your health care provider.  Contact a health care provider if you have a fever or chills or if you have redness, swelling, or pain around your port insertion site.  Keep all follow-up visits as told by your health care provider. This information is not intended to replace advice given to you by your health care provider. Make sure you discuss any questions you have with your health care provider. Document Revised: 01/10/2018 Document Reviewed: 01/10/2018 Elsevier Patient Education  [place].

## 2019-09-04 NOTE — Progress Notes (Signed)
[facility] CSW Progress Note  Holiday representative met with patient in infusion to provide ongoing emotional support. Patient sleepy today after getting port placed. She reports slightly lower anxiety than previously. Able to complete her MRI without ativan. Declined to speak further due to being tired from medicines. CSW will check in during next infusion treatment.    Gina Areola Zellie Jenning LCSW

## 2019-09-04 NOTE — Consult Note (Signed)
Chief Complaint: Breast cancer chemotherapy access  Referring Physician(s): Gudena,Vinay  Supervising Physician: Sandi Mariscal  Patient Status: [facility] - Out-pt  History of Present Illness: Gina PROBY is a 49 y.o. female History of breast cancer right sided. Team is requesting portacath placement for chemotherapy access.  Past Medical History:  Diagnosis Date  . Asthma    as a child  . Cancer ([place]) 07/2019   breast  . Family history of breast cancer   . Family history of colon cancer   . Family history of prostate cancer     Past Surgical History:  Procedure Laterality Date  . COLONOSCOPY    . HARDWARE REMOVAL Right 09/11/2014   Procedure: Removal Deep Hardware Right Knee;  Surgeon: Newt Minion, MD;  Location: [place];  Service: Orthopedics;  Laterality: Right;  . ORIF PATELLA Right 03/22/2014   Procedure: Open Reduction Internal Fixation Right Patella ;  Surgeon: Newt Minion, MD;  Location: [place];  Service: Orthopedics;  Laterality: Right;    Allergies: Penicillins  Medications: Prior to Admission medications   Medication Sig Start Date End Date Taking? Authorizing Provider  acetaminophen (TYLENOL) 325 MG tablet Take 650 mg by mouth every 6 (six) hours as needed.    [provider]  ibuprofen (ADVIL) 800 MG tablet Take 1 tablet (800 mg total) by mouth 3 (three) times daily. 07/31/19   Loura Halt A, NP  lidocaine-prilocaine (EMLA) cream Apply to affected area once 08/22/19   [name], MD  LORazepam (ATIVAN) 0.5 MG tablet Take 1 tablet (0.5 mg total) by mouth at bedtime as needed (Nausea or vomiting). 08/22/19   [name], MD  ondansetron (ZOFRAN) 8 MG tablet Take 1 tablet (8 mg total) by mouth 2 (two) times daily as needed. Start on the third day after chemotherapy. 08/22/19   [name], MD  prochlorperazine (COMPAZINE) 10 MG tablet Take 1 tablet (10 mg total) by mouth every 6 (six) hours as needed (Nausea or vomiting). 08/22/19   [name], MD    traMADol (ULTRAM) 50 MG tablet Take 1 tablet (50 mg total) by mouth every 6 (six) hours as needed. 07/31/19   Orvan July, NP     Family History  Problem Relation Age of Onset  . Hypertension Mother   . Diabetes Mother   . Breast cancer Mother        dx. 32s  . Hypertension Father   . Colon cancer Paternal Aunt        dx. 85s  . Breast cancer Maternal Grandmother        bilateral  . Cervical cancer Maternal Grandmother        tumor on cervix  . Prostate cancer Maternal Uncle        dx. 68s  . Breast cancer Cousin        dx. late 5s  . Breast cancer Cousin        dx. <50    Social History   Socioeconomic History  . Marital status: Legally Separated    Spouse name: Not on file  . Number of children: Not on file  . Years of education: Not on file  . Highest education level: Professional school degree (e.g., MD, DDS, DVM, JD)  Occupational History  . Not on file  Tobacco Use  . Smoking status: Current Every Day Smoker    Packs/day: 0.10  . Smokeless tobacco: Never Used  . Tobacco comment: marijuana  Substance and Sexual Activity  .  Alcohol use: Yes    Comment: socailly  . Drug use: Yes    Types: Marijuana  . Sexual activity: Yes    Birth control/protection: None  Other Topics Concern  . Not on file  Social History Narrative  . Not on file   Social Determinants of Health   Financial Resource Strain:   . Difficulty of Paying Living Expenses: Not on file  Food Insecurity:   . Worried About Charity fundraiser in the Last Year: Not on file  . Ran Out of Food in the Last Year: Not on file  Transportation Needs: Unmet Transportation Needs  . Lack of Transportation (Medical): Yes  . Lack of Transportation (Non-Medical): Yes  Physical Activity:   . Days of Exercise per Week: Not on file  . Minutes of Exercise per Session: Not on file  Stress:   . Feeling of Stress : Not on file  Social Connections:   . Frequency of Communication with Friends and Family: Not  on file  . Frequency of Social Gatherings with Friends and Family: Not on file  . Attends Religious Services: Not on file  . Active Member of Clubs or Organizations: Not on file  . Attends Archivist Meetings: Not on file  . Marital Status: Not on file    Review of Systems: A 12 point ROS discussed and pertinent positives are indicated in the HPI above.  All other systems are negative.  Review of Systems  Constitutional: Negative for fatigue and fever.  HENT: Negative for congestion.   Respiratory: Negative for cough and shortness of breath.   Gastrointestinal: Negative for abdominal pain, diarrhea, nausea and vomiting.    Vital Signs: BP (!) 162/104 (BP Location: Left Arm)   Pulse 93   Temp 98.6 F (37 C) (Oral)   Resp 18   Ht 5' 9"  (1.753 m)   Wt 142 lb 3.2 oz (64.5 kg)   LMP 08/02/2019   SpO2 100%   BMI 21.00 kg/m   Physical Exam Vitals and nursing note reviewed.  Constitutional:      Appearance: She is well-developed.  HENT:     Head: Normocephalic and atraumatic.  Eyes:     Conjunctiva/sclera: Conjunctivae normal.  Cardiovascular:     Rate and Rhythm: Normal rate and regular rhythm.     Heart sounds: Normal heart sounds.  Pulmonary:     Effort: Pulmonary effort is normal.     Breath sounds: Normal breath sounds.  Musculoskeletal:        General: Normal range of motion.     Cervical back: Normal range of motion.  Skin:    General: Skin is warm.  Neurological:     Mental Status: She is alert and oriented to person, place, and time.     Imaging: CT Chest W Contrast  Result Date: 08/31/2019 CLINICAL DATA:  New diagnosis right breast cancer staging EXAM: CT CHEST, ABDOMEN, AND PELVIS WITH CONTRAST TECHNIQUE: Multidetector CT imaging of the chest, abdomen and pelvis was performed following the standard protocol during bolus administration of intravenous contrast. CONTRAST:  153m OMNIPAQUE IOHEXOL 300 MG/ML SOLN, additional oral enteric contrast  COMPARISON:  None. FINDINGS: CT CHEST FINDINGS Cardiovascular: No significant vascular findings. Normal heart size. No pericardial effusion. Mediastinum/Nodes: Bulky right axillary and subpectoral lymph nodes, measuring up to 2.8 x 2.4 cm (series 3, image 31). Thyroid gland, trachea, and esophagus demonstrate no significant findings. Lungs/Pleura: Lungs are clear. No pleural effusion or pneumothorax. Musculoskeletal: Very large, central  mass of the right breast measuring least 7.0 cm, which appears to maintain a distinct fat plane from the underlying pectoral muscle (series 3, image 41). No suspicious bone lesions identified. CT ABDOMEN PELVIS FINDINGS Hepatobiliary: No solid liver abnormality is seen. No gallstones, gallbladder wall thickening, or biliary dilatation. Pancreas: Unremarkable. No pancreatic ductal dilatation or surrounding inflammatory changes. Spleen: Normal in size without significant abnormality. Adrenals/Urinary Tract: Adrenal glands are unremarkable. Kidneys are normal, without renal calculi, solid lesion, or hydronephrosis. Bladder is unremarkable. Stomach/Bowel: Stomach is within normal limits. Appendix appears normal. No evidence of bowel wall thickening, distention, or inflammatory changes. Vascular/Lymphatic: Aortic atherosclerosis. No enlarged abdominal or pelvic lymph nodes. Reproductive: Multiple bulky uterine fibroids. Bilateral ovarian cysts or follicles. Other: No abdominal wall hernia or abnormality. Small volume fluid in the low pelvis. Musculoskeletal: No acute or significant osseous findings. IMPRESSION: 1. Very large, central right breast mass measuring least 7.0 cm, which appears to maintain a distinct fat plane from the underlying pectoral muscle. 2. Bulky right axillary and subpectoral lymph nodes, consistent with nodal metastatic disease. 3. No evidence of distant metastatic disease within the chest, abdomen or pelvis. 4. Multiple bulky uterine fibroids. 5. Small volume fluid  in the low pelvis, likely functional. 6.  Aortic Atherosclerosis (ICD10-I70.0). Electronically Signed   By: Eddie Candle M.D.   On: 08/31/2019 16:37   NM Bone Scan Whole Body  Result Date: 08/29/2019 CLINICAL DATA:  Breast cancer, staging EXAM: NUCLEAR MEDICINE WHOLE BODY BONE SCAN TECHNIQUE: Whole body anterior and posterior images were obtained approximately 3 hours after intravenous injection of radiopharmaceutical. RADIOPHARMACEUTICALS:  18.6 mCi Technetium-33mMDP IV COMPARISON:  None FINDINGS: Minimal uptake of tracer at RIGHT knee typically degenerative. No additional sites of abnormal osseous tracer uptake are seen to suggest metastatic disease. Expected urinary tract and soft tissue distribution of tracer. IMPRESSION: No scintigraphic evidence of osseous metastatic disease. Electronically Signed   By: MLavonia DanaM.D.   On: 08/29/2019 20:49   CT Abdomen Pelvis W Contrast  Result Date: 08/31/2019 CLINICAL DATA:  New diagnosis right breast cancer staging EXAM: CT CHEST, ABDOMEN, AND PELVIS WITH CONTRAST TECHNIQUE: Multidetector CT imaging of the chest, abdomen and pelvis was performed following the standard protocol during bolus administration of intravenous contrast. CONTRAST:  10100mOMNIPAQUE IOHEXOL 300 MG/ML SOLN, additional oral enteric contrast COMPARISON:  None. FINDINGS: CT CHEST FINDINGS Cardiovascular: No significant vascular findings. Normal heart size. No pericardial effusion. Mediastinum/Nodes: Bulky right axillary and subpectoral lymph nodes, measuring up to 2.8 x 2.4 cm (series 3, image 31). Thyroid gland, trachea, and esophagus demonstrate no significant findings. Lungs/Pleura: Lungs are clear. No pleural effusion or pneumothorax. Musculoskeletal: Very large, central mass of the right breast measuring least 7.0 cm, which appears to maintain a distinct fat plane from the underlying pectoral muscle (series 3, image 41). No suspicious bone lesions identified. CT ABDOMEN PELVIS FINDINGS  Hepatobiliary: No solid liver abnormality is seen. No gallstones, gallbladder wall thickening, or biliary dilatation. Pancreas: Unremarkable. No pancreatic ductal dilatation or surrounding inflammatory changes. Spleen: Normal in size without significant abnormality. Adrenals/Urinary Tract: Adrenal glands are unremarkable. Kidneys are normal, without renal calculi, solid lesion, or hydronephrosis. Bladder is unremarkable. Stomach/Bowel: Stomach is within normal limits. Appendix appears normal. No evidence of bowel wall thickening, distention, or inflammatory changes. Vascular/Lymphatic: Aortic atherosclerosis. No enlarged abdominal or pelvic lymph nodes. Reproductive: Multiple bulky uterine fibroids. Bilateral ovarian cysts or follicles. Other: No abdominal wall hernia or abnormality. Small volume fluid in the low pelvis. Musculoskeletal:  No acute or significant osseous findings. IMPRESSION: 1. Very large, central right breast mass measuring least 7.0 cm, which appears to maintain a distinct fat plane from the underlying pectoral muscle. 2. Bulky right axillary and subpectoral lymph nodes, consistent with nodal metastatic disease. 3. No evidence of distant metastatic disease within the chest, abdomen or pelvis. 4. Multiple bulky uterine fibroids. 5. Small volume fluid in the low pelvis, likely functional. 6.  Aortic Atherosclerosis (ICD10-I70.0). Electronically Signed   By: Eddie Candle M.D.   On: 08/31/2019 16:37   MR BREAST BILATERAL W WO CONTRAST INC CAD  Result Date: 09/03/2019 CLINICAL DATA:  49 year old female with newly diagnosed right breast cancer and metastatic axillary lymphadenopathy. LABS:  None performed on site. EXAM: BILATERAL BREAST MRI WITH AND WITHOUT CONTRAST TECHNIQUE: Multiplanar, multisequence MR images of both breasts were obtained prior to and following the intravenous administration of 6 ml of Gadavist. Three-dimensional MR images were rendered by post-processing of the original MR data  on an independent workstation. The three-dimensional MR images were interpreted, and findings are reported in the following complete MRI report for this study. Three dimensional images were evaluated at the independent DynaCad workstation COMPARISON:  Previous exam(s). FINDINGS: Breast composition: c. Heterogeneous fibroglandular tissue. Background parenchymal enhancement: Moderate. Right breast: There is a large, complex enhancing mass involving the entire central and lateral right breast consistent with the patient's biopsy-proven malignancy. Overall measurements are 6.2 x 6.3 x 7.2 cm (AP by transverse by craniocaudal dimensions). The mass abuts the overlying skin, which demonstrates thickening and enhancement along the entire lateral breast and chest wall, as well as portions of the lower inner quadrant. There is no underlying pectoralis muscle involvement. Left breast: There is a 6 mm focus of irregular enhancement in the superior central left breast at middle depth (series 11, image 83/160). No other suspicious mass or abnormal enhancement. Lymph nodes: At least 7 markedly abnormal level 1 lymph nodes are noted in the right axilla. Abnormal level 2 and 3 level lymph nodes are also partially visualized. There is no suspicious internal mammary chain or left axillary lymphadenopathy. Ancillary findings:  None. IMPRESSION: 1. Biopsy-proven malignancy involving the entire central and lateral right breast. The mass measures 7.2 cm in the greatest dimension. 2. Suspicious skin thickening and enhancement involving the entire lateral right breast and chest wall as well as portions of the lower inner quadrant. 3. 7+ markedly abnormal level 1 right axillary lymph nodes. Abnormal level 2 and 3 lymph nodes are also partially visualized. 4. Indeterminate 6 mm focus of irregular enhancement in the superior central left breast (series 11, image 83/160). Recommendation is to proceed to MRI guided biopsy. RECOMMENDATION: 1. MRI  guided biopsy of the left breast. 2. Per clinical treatment plan on the right. BI-RADS CATEGORY  4: Suspicious. Electronically Signed   By: Kristopher Oppenheim M.D.   On: 09/03/2019 14:32   US BREAST LTD UNI RIGHT INC AXILLA  Result Date: 08/14/2019 CLINICAL DATA:  Palpable right breast mass with associated skin redness noticed 2 weeks ago. The patient went to the emergency room at that time and head an abscess drained. She continues to have a palpable mass with no improvement in the skin redness. She also has a palpable mass in the inferior right axilla. She has finished a course of antibiotics. EXAM: DIGITAL DIAGNOSTIC BILATERAL MAMMOGRAM WITH CAD AND TOMO ULTRASOUND RIGHT BREAST COMPARISON:  None.  Baseline. ACR Breast Density Category d: The breast tissue is extremely dense, which lowers the  sensitivity of mammography. FINDINGS: There is a large, rounded, circumscribed mass in the retroareolar right breast, corresponding to the palpable mass, marked with a metallic marker. There is also a smaller adjacent oval, circumscribed mass. Also noted is anterior right breast skin thickening. There are also multiple significantly enlarged right axillary lymph nodes. Mammographic images were processed with CAD. On physical exam, the patient has an approximately 8 cm rounded, firm, protuberant palpable mass centered in the retroareolar region of the right breast and involving all 4 quadrants. There is a rounded more protuberant component with dark overlying skin, corresponding to the area of recently drained. There is also mild dark skin redness involving some of the anterior portions of the mass. There is also an approximately 2 cm oval palpable mass in the inferior right axilla. Targeted ultrasound is performed, showing a 7.3 x 7.0 x 5.6 cm irregular, heterogeneous mass centered in the retroareolar right breast and extending into all 4 quadrants, corresponding to the palpable mass. This has an eccentric necrotic component,  corresponding to the area of recent drainage. This is extending partially into the overlying skin which is thickened. Ultrasound of the right axilla demonstrates multiple markedly abnormal right axillary lymph nodes. Many of these are significantly enlarged with loss of the normal fatty hila, including 2 retropectoral nodes. There are approximately 12 abnormal lymph nodes. IMPRESSION: 1. 7.3 cm central right breast mass involving all 4 quadrants of the breast with imaging features highly suspicious for malignancy. 2. At least 12 markedly abnormal right axillary lymph nodes, including 2 retropectoral nodes, highly suspicious for metastatic nodes. 3. No evidence of malignancy on the left. RECOMMENDATION: Ultrasound-guided core needle biopsy of the 7.3 cm right breast mass and ultrasound-guided core needle biopsy of 1 of the abnormal right axillary lymph nodes. This has discussed the patient and the biopsies are scheduled follow. I have discussed the findings and recommendations with the patient. If applicable, a reminder letter will be sent to the patient regarding the next appointment. BI-RADS CATEGORY  5: Highly suggestive of malignancy. Electronically Signed   By: Claudie Revering M.D.   On: 08/14/2019 12:19   ECHOCARDIOGRAM COMPLETE  Result Date: 08/29/2019    ECHOCARDIOGRAM REPORT   Patient Name:   EYLEEN RAWLINSON Date of Exam: 08/29/2019 Medical Rec #:  [id number]        Height:       69.0 in Accession #:    [id number]       Weight:       149.3 lb Date of Birth:  [birth year]        BSA:          1.825 m Patient Age:    48 years         BP:           149/110 mmHg Patient Gender: F                HR:           92 bpm. Exam Location:  Outpatient Procedure: 2D Echo and Strain Analysis Indications:    Chemo V67.2 / Z09  History:        Patient has no prior history of Echocardiogram examinations.                 Breast cancer.  Sonographer:    Darlina Sicilian RDCS Referring Phys: [phone] Leonardo  1. Left  ventricular ejection fraction, by estimation, is 60 to 65%. The left ventricle has normal  function. The left ventricle has no regional wall motion abnormalities. There is moderate asymmetric left ventricular hypertrophy. Left ventricular diastolic parameters were normal. The average left ventricular global longitudinal strain is -15.8 %.  2. Right ventricular systolic function is normal. The right ventricular size is normal. Tricuspid regurgitation signal is inadequate for assessing PA pressure.  3. The mitral valve is normal in structure and function. No evidence of mitral valve regurgitation.  4. The aortic valve is tricuspid. Aortic valve regurgitation is not visualized. No aortic stenosis is present. FINDINGS  Left Ventricle: Left ventricular ejection fraction, by estimation, is 60 to 65%. The left ventricle has normal function. The left ventricle has no regional wall motion abnormalities. The average left ventricular global longitudinal strain is -15.8 %. The left ventricular internal cavity size was normal in size. There is moderate asymmetric left ventricular hypertrophy. Left ventricular diastolic parameters were normal. Right Ventricle: The right ventricular size is normal. No increase in right ventricular wall thickness. Right ventricular systolic function is normal. Tricuspid regurgitation signal is inadequate for assessing PA pressure. Left Atrium: Left atrial size was normal in size. Right Atrium: Right atrial size was normal in size. Pericardium: There is no evidence of pericardial effusion. Mitral Valve: The mitral valve is normal in structure and function. No evidence of mitral valve regurgitation. Tricuspid Valve: The tricuspid valve is normal in structure. Tricuspid valve regurgitation is not demonstrated. Aortic Valve: The aortic valve is tricuspid. Aortic valve regurgitation is not visualized. No aortic stenosis is present. Pulmonic Valve: The pulmonic valve was grossly normal. Pulmonic valve  regurgitation is trivial. Aorta: The aortic root is normal in size and structure. IAS/Shunts: The interatrial septum was not well visualized.  LEFT VENTRICLE PLAX 2D LVIDd:         3.00 cm  Diastology LVIDs:         1.90 cm  LV e' lateral:   7.51 cm/s LV PW:         0.90 cm  LV E/e' lateral: 11.1 LV IVS:        1.50 cm  LV e' medial:    6.09 cm/s LVOT diam:     1.90 cm  LV E/e' medial:  13.7 LVOT Area:     2.84 cm                         2D Longitudinal Strain                         2D Strain GLS Avg:     -15.8 % RIGHT VENTRICLE RV S prime:     14.50 cm/s TAPSE (M-mode): 2.4 cm LEFT ATRIUM             Index       RIGHT ATRIUM          Index LA diam:        2.50 cm 1.37 cm/m  RA Area:     7.94 cm LA Vol (A2C):   29.0 ml 15.89 ml/m RA Volume:   13.80 ml 7.56 ml/m LA Vol (A4C):   26.4 ml 14.47 ml/m LA Biplane Vol: 29.7 ml 16.28 ml/m   AORTA Ao Root diam: 2.30 cm MITRAL VALVE MV Area (PHT): 4.86 cm    SHUNTS MV Decel Time: 156 msec    Systemic Diam: 1.90 cm MV E velocity: 83.20 cm/s MV A velocity: 87.90 cm/s MV E/A ratio:  0.95 Oswaldo Milian MD  Electronically signed by Oswaldo Milian MD Signature Date/Time: 08/29/2019/11:43:57 AM    Final    [place] AXILLARY NODE CORE BIOPSY RIGHT  Addendum Date: 08/16/2019   ADDENDUM REPORT: 08/16/2019 14:16 ADDENDUM: 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 This was found to be concordant by Dr. Dorise Bullion. Pathology revealed INVASIVE DUCTAL CARCINOMA WITH NECROSIS, NO NODAL TISSUE IDENTIFIED of the low Right axilla. This was found to be concordant by Dr. Dorise Bullion. Pathology results were discussed with the patient by telephone. The patient reported doing well after the biopsies with tenderness and pink tinged oozing at the sites. Post biopsy instructions and care were reviewed and questions were answered. The patient was encouraged to call [facility] for any additional  concerns. The patient was referred to [facility] on August 22, 2019. The patient is scheduled for a bilateral breast MRI at [facility], [place] location, on August 17, 2019. Pathology results reported by Terie Purser, RN on 08/16/2019. Electronically Signed   By: Dorise Bullion III M.D   On: 08/16/2019 14:16   Result Date: 08/16/2019 CLINICAL DATA:  Biopsy of a right breast mass and a right axillary node. EXAM: ULTRASOUND GUIDED RIGHT BREAST CORE NEEDLE BIOPSY COMPARISON:  Previous exam(s). FINDINGS: I met with the patient and we discussed the procedure of ultrasound-guided biopsy, including benefits and alternatives. We discussed the high likelihood of a successful procedure. We discussed the risks of the procedure, including infection, bleeding, tissue injury, clip migration, and inadequate sampling. Informed written consent was given. The usual time-out protocol was performed immediately prior to the procedure. Lesion quadrant: 9 o'clock Using sterile technique and 1% Lidocaine as local anesthetic, under direct ultrasound visualization, a 12 gauge spring-loaded device was used to perform biopsy of the 9 o'clock right breast mass using a lateral approach. At the conclusion of the procedure ribbon shaped tissue marker clip was deployed into the biopsy cavity. Follow up 2 view mammogram was performed and dictated separately. Lesion quadrant: Low right axilla Using sterile technique and 1% Lidocaine as local anesthetic, under direct ultrasound visualization, a 14 gauge spring-loaded device was used to perform biopsy of an abnormal lymph node in the low right axilla using a lateral approach. At the conclusion of the procedure [facility] tissue marker clip was deployed into the biopsy cavity. Follow up 2 view mammogram was performed and dictated separately. IMPRESSION: Ultrasound guided biopsy of a right breast mass and a right low  axillary mass. No apparent complications. Electronically Signed: By: Dorise Bullion III M.D On: 08/14/2019 12:03   MS DIGITAL DIAG TOMO BILAT  Result Date: 08/14/2019 CLINICAL DATA:  Palpable right breast mass with associated skin redness noticed 2 weeks ago. The patient went to the emergency room at that time and head an abscess drained. She continues to have a palpable mass with no improvement in the skin redness. She also has a palpable mass in the inferior right axilla. She has finished a course of antibiotics. EXAM: DIGITAL DIAGNOSTIC BILATERAL MAMMOGRAM WITH CAD AND TOMO ULTRASOUND RIGHT BREAST COMPARISON:  None.  Baseline. ACR Breast Density Category d: The breast tissue is extremely dense, which lowers the sensitivity of mammography. FINDINGS: There is a large, rounded, circumscribed mass in the retroareolar right breast, corresponding to the palpable mass, marked with a metallic marker. There is also a smaller adjacent oval, circumscribed mass. Also noted is anterior  right breast skin thickening. There are also multiple significantly enlarged right axillary lymph nodes. Mammographic images were processed with CAD. On physical exam, the patient has an approximately 8 cm rounded, firm, protuberant palpable mass centered in the retroareolar region of the right breast and involving all 4 quadrants. There is a rounded more protuberant component with dark overlying skin, corresponding to the area of recently drained. There is also mild dark skin redness involving some of the anterior portions of the mass. There is also an approximately 2 cm oval palpable mass in the inferior right axilla. Targeted ultrasound is performed, showing a 7.3 x 7.0 x 5.6 cm irregular, heterogeneous mass centered in the retroareolar right breast and extending into all 4 quadrants, corresponding to the palpable mass. This has an eccentric necrotic component, corresponding to the area of recent drainage. This is extending partially  into the overlying skin which is thickened. Ultrasound of the right axilla demonstrates multiple markedly abnormal right axillary lymph nodes. Many of these are significantly enlarged with loss of the normal fatty hila, including 2 retropectoral nodes. There are approximately 12 abnormal lymph nodes. IMPRESSION: 1. 7.3 cm central right breast mass involving all 4 quadrants of the breast with imaging features highly suspicious for malignancy. 2. At least 12 markedly abnormal right axillary lymph nodes, including 2 retropectoral nodes, highly suspicious for metastatic nodes. 3. No evidence of malignancy on the left. RECOMMENDATION: Ultrasound-guided core needle biopsy of the 7.3 cm right breast mass and ultrasound-guided core needle biopsy of 1 of the abnormal right axillary lymph nodes. This has discussed the patient and the biopsies are scheduled follow. I have discussed the findings and recommendations with the patient. If applicable, a reminder letter will be sent to the patient regarding the next appointment. BI-RADS CATEGORY  5: Highly suggestive of malignancy. Electronically Signed   By: Claudie Revering M.D.   On: 08/14/2019 12:19   MM CLIP PLACEMENT RIGHT  Result Date: 08/14/2019 CLINICAL DATA:  Evaluate biopsy markers EXAM: DIAGNOSTIC RIGHT MAMMOGRAM POST ULTRASOUND BIOPSY COMPARISON:  Previous exam(s). FINDINGS: Mammographic images were obtained following ultrasound guided biopsy of a right breast mass and a low right axillary node. The ribbon shaped clip is in the lateral anterior aspect of the breast mass. HydroMARK clip is within the biopsied node. IMPRESSION: Appropriate positioning of biopsy clips as above. 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 This was found to be concordant by Dr. Dorise Bullion. Pathology revealed INVASIVE DUCTAL CARCINOMA WITH NECROSIS, NO NODAL TISSUE IDENTIFIED of the low Right axilla. This was found to be concordant by Dr. Dorise Bullion. Pathology results were discussed with the patient by telephone. The patient reported doing well after the biopsies with tenderness and pink tinged oozing at the sites. Post biopsy instructions and care were reviewed and questions were answered. The patient was encouraged to call [facility] for any additional concerns. The patient was referred to [facility] on August 22, 2019. The patient is scheduled for a bilateral breast MRI at [facility], [place] location, on August 17, 2019. Pathology results reported by  Terie Purser, RN on 08/16/2019. Electronically Signed   By: Dorise Bullion III M.D   On: 08/16/2019 14:16   Result Date: 08/16/2019 CLINICAL DATA:  Biopsy of a right breast mass and a right axillary node. EXAM: ULTRASOUND GUIDED RIGHT BREAST CORE NEEDLE BIOPSY COMPARISON:  Previous exam(s). FINDINGS: I met with the patient and we discussed the procedure of ultrasound-guided biopsy, including benefits and alternatives. We discussed the high likelihood of a successful procedure. We discussed the risks of the procedure, including infection, bleeding, tissue injury, clip migration, and inadequate sampling. Informed written consent was given. The usual time-out protocol was performed immediately prior to the procedure. Lesion quadrant: 9 o'clock Using sterile technique and 1% Lidocaine as local anesthetic, under direct ultrasound visualization, a 12 gauge spring-loaded device was used to perform biopsy of the 9 o'clock right breast mass using a lateral approach. At the conclusion of the procedure ribbon  shaped tissue marker clip was deployed into the biopsy cavity. Follow up 2 view mammogram was performed and dictated separately. Lesion quadrant: Low right axilla Using sterile technique and 1% Lidocaine as local anesthetic, under direct ultrasound visualization, a 14 gauge spring-loaded device was used to perform biopsy of an abnormal lymph node in the low right axilla using a lateral approach. At the conclusion of the procedure [facility] tissue marker clip was deployed into the biopsy cavity. Follow up 2 view mammogram was performed and dictated separately. IMPRESSION: Ultrasound guided biopsy of a right breast mass and a right low axillary mass. No apparent complications. Electronically Signed: By: Dorise Bullion III M.D On: 08/14/2019 12:03    Labs:  CBC: Recent Labs    08/22/19 1225  WBC 3.9*  HGB 12.1  HCT 34.7*  PLT 240    COAGS: No results for input(s): INR, APTT in the last 8760 hours.  BMP: Recent Labs    08/22/19 1225  NA 141  K 3.4*  CL 103  CO2 28  GLUCOSE 104*  BUN 9  CALCIUM 8.9  CREATININE 0.86  GFRNONAA >60  GFRAA >60    LIVER FUNCTION TESTS: Recent Labs    08/22/19 1225  BILITOT 0.2*  AST 18  ALT 11  ALKPHOS 59  PROT 7.8  ALBUMIN 3.9    TUMOR MARKERS: No results for input(s): AFPTM, CEA, CA199, CHROMGRNA in the last 8760 hours.  Assessment and Plan:  49 y.o, female outpatient. History of breast cancer right sided. Team is requesting portacath placement for chemotherapy access.  Pertinent Imaging 3.5.21 - CT Chest shows right sided breast mass  Pertinent IR History none  Pertinent Allergies PCN   All labs and medications are within acceptable parameters.  Patient is afebrile.  Risks and benefits of image guided port-a-catheter placement was discussed with the patient including, but not limited to bleeding, infection, pneumothorax, or fibrin sheath development and need for additional procedures.  All of the patient's questions were  answered, patient is agreeable to proceed. Consent signed and in chart.    Thank you for this interesting consult.  I greatly enjoyed meeting SUZIE VANDAM and look forward to participating in their care.  A copy of this report was sent to the requesting provider on this date.  Electronically Signed: Avel Peace, NP 09/04/2019, 10:43 AM   I spent a total of 40 Minutes 40 Minutes  5 Minutes in face to face in clinical consultation, greater than 50% of which was counseling/coordinating care for portacath placement

## 2019-09-04 NOTE — Procedures (Signed)
Pre Procedure Dx: Breast cancer Post Procedural Dx: Same  Successful placement of left IJ approach port-a-cath with tip at the superior caval atrial junction. The catheter is ready for immediate use.  Estimated Blood Loss: Minimal  Complications: None immediate.  Jay Torrell Krutz, MD Pager #: [phone]   

## 2019-09-04 NOTE — Sedation Documentation (Signed)
Patient is resting comfortably with eyes closed, in NAD. 

## 2019-09-05 ENCOUNTER — Telehealth: Payer: Self-pay | Admitting: Genetic Counselor

## 2019-09-05 ENCOUNTER — Encounter: Payer: Self-pay | Admitting: Genetic Counselor

## 2019-09-05 ENCOUNTER — Telehealth: Payer: Self-pay | Admitting: *Deleted

## 2019-09-05 ENCOUNTER — Other Ambulatory Visit: Payer: Self-pay | Admitting: Hematology and Oncology

## 2019-09-05 DIAGNOSIS — Z1379 Encounter for other screening for genetic and chromosomal anomalies: Secondary | ICD-10-CM | POA: Insufficient documentation

## 2019-09-05 DIAGNOSIS — R928 Other abnormal and inconclusive findings on diagnostic imaging of breast: Secondary | ICD-10-CM

## 2019-09-05 DIAGNOSIS — Z1501 Genetic susceptibility to malignant neoplasm of breast: Secondary | ICD-10-CM | POA: Insufficient documentation

## 2019-09-05 NOTE — Telephone Encounter (Signed)
Spoke with patient about the results of her MRI.  Informed her she needed and MRI bx of small area in her left breast.  Patient verbalized understanding.  She will be getting a call from GI to schedule this. She states she did well with her chemo yesterday with no complaints other than her port area being sore.  Encouraged her to call with any needs or concerns.

## 2019-09-05 NOTE — Telephone Encounter (Signed)
Disclosed positive genetic test results. 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 We will schedule a follow-up appointment to discuss this result in more detail. Gina Ross would like to wait until tomorrow to schedule that appointment, so we will plan on reaching again out tomorrow.

## 2019-09-06 ENCOUNTER — Inpatient Hospital Stay: Payer: Medicaid Other

## 2019-09-06 ENCOUNTER — Other Ambulatory Visit: Payer: Self-pay

## 2019-09-06 ENCOUNTER — Telehealth: Payer: Self-pay

## 2019-09-06 ENCOUNTER — Other Ambulatory Visit (HOSPITAL_COMMUNITY): Payer: No Typology Code available for payment source

## 2019-09-06 ENCOUNTER — Telehealth: Payer: Self-pay | Admitting: Genetic Counselor

## 2019-09-06 VITALS — BP 159/97 | Temp 99.1°F | Resp 18

## 2019-09-06 DIAGNOSIS — Z5111 Encounter for antineoplastic chemotherapy: Secondary | ICD-10-CM | POA: Diagnosis not present

## 2019-09-06 DIAGNOSIS — C50811 Malignant neoplasm of overlapping sites of right female breast: Secondary | ICD-10-CM

## 2019-09-06 MED ORDER — PEGFILGRASTIM-CBQV 6 MG/0.6ML ~~LOC~~ SOSY
6.0000 mg | PREFILLED_SYRINGE | Freq: Once | SUBCUTANEOUS | Status: AC
  Administered 2019-09-06: 6 mg via SUBCUTANEOUS

## 2019-09-06 MED ORDER — PEGFILGRASTIM-CBQV 6 MG/0.6ML ~~LOC~~ SOSY
PREFILLED_SYRINGE | SUBCUTANEOUS | Status: AC
  Filled 2019-09-06: qty 0.6

## 2019-09-06 NOTE — Telephone Encounter (Signed)
Scheduled follow-up appointment on 3/18 at 1pm to review genetic results. This will be a virtual visit.

## 2019-09-06 NOTE — Telephone Encounter (Signed)
Patient informed medicaid approved and information to be sent to the proper billing departments.

## 2019-09-06 NOTE — Patient Instructions (Addendum)

## 2019-09-07 ENCOUNTER — Inpatient Hospital Stay: Admission: RE | Admit: 2019-09-07 | Payer: Medicaid Other | Source: Ambulatory Visit

## 2019-09-10 NOTE — Progress Notes (Signed)
Patient Care Team: Default, Provider, MD as PCP - General [name], RN as Oncology Nurse Navigator [name], RN as Oncology Nurse Navigator [name], MD as Consulting Physician (General Surgery) [name], MD as Consulting Physician (Hematology and Oncology) [name], MD as Consulting Physician (Radiation Oncology)  DIAGNOSIS:    ICD-10-CM   1. Malignant neoplasm of overlapping sites of right breast in female, estrogen receptor positive ([place])  C50.811    Z17.0     SUMMARY OF ONCOLOGIC HISTORY: Oncology History  Malignant neoplasm of overlapping sites of right breast in female, estrogen receptor positive ([place])  08/17/2019 Initial Diagnosis   Patient palpated a right breast mass and right axilla mass and noted skin redness x2 weeks. She went to the ED and had an abscess drained with no improvement. Diagnostic mammogram and US showed a 7.3cm right breast mass extending into all 4 quadrants and partially into overlying skin with 12 abnormal right axillary lymph nodes. Biopsy  showed IDC with necrosis in the breast and axilla, grade 3, HER-2 equivocal by IHC, negative by FISH, ER+ 40% weak, PR -, Ki67 90%.    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 The report date is 09/03/2019.  The Common Hereditary Cancers Panel offered by [facility] includes sequencing and/or deletion duplication testing of the following 48 genes: APC, ATM, AXIN2, BARD1, BMPR1A, BRCA1, BRCA2, BRIP1, CDH1, CDK4, CDKN2A (p14ARF), CDKN2A (p16INK4a), CHEK2, CTNNA1, DICER1, EPCAM (Deletion/duplication testing only), GREM1 (promoter region deletion/duplication testing only), KIT, MEN1, MLH1, MSH2, MSH3, MSH6, MUTYH, NBN, NF1, NHTL1, PALB2, PDGFRA, PMS2, POLD1, POLE, PTEN, RAD50, RAD51C, RAD51D, RNF43, SDHB, SDHC, SDHD, SMAD4, SMARCA4. STK11, TP53, TSC1, TSC2, and  VHL.  The following genes were evaluated for sequence changes only: SDHA and HOXB13 c.251G>A variant only.    09/04/2019 -  Chemotherapy   The patient had DOXOrubicin (ADRIAMYCIN) chemo injection 110 mg, 60 mg/m2 = 110 mg, Intravenous,  Once, 1 of 4 cycles Administration: 110 mg (09/04/2019) palonosetron (ALOXI) injection 0.25 mg, 0.25 mg, Intravenous,  Once, 1 of 4 cycles Administration: 0.25 mg (09/04/2019) pegfilgrastim-cbqv (UDENYCA) injection 6 mg, 6 mg, Subcutaneous, Once, 1 of 4 cycles Administration: 6 mg (09/06/2019) cyclophosphamide (CYTOXAN) 1,100 mg in sodium chloride 0.9 % 250 mL chemo infusion, 600 mg/m2 = 1,100 mg, Intravenous,  Once, 1 of 4 cycles Administration: 1,100 mg (09/04/2019) PACLitaxel (TAXOL) 144 mg in sodium chloride 0.9 % 250 mL chemo infusion (</= 90m/m2), 80 mg/m2 = 144 mg, Intravenous,  Once, 0 of 12 cycles fosaprepitant (EMEND) 150 mg in sodium chloride 0.9 % 145 mL IVPB, 150 mg, Intravenous,  Once, 1 of 4 cycles Administration: 150 mg (09/04/2019)  for chemotherapy treatment.      CHIEF COMPLIANT: Cycle 1 Day 8 Adriamycin and Cytoxan   INTERVAL HISTORY: Gina SCHROEPFERis a 49y.o. with above-mentioned history of right breast cancer currently on neoadjuvant chemotherapy with dose dense Adriamycin and Cytoxan. She presents to the clinic today for a toxicity check following cycle 1.    She was very sleepy after the first cycle of chemotherapy and had nausea vomiting for 2 days after.  She tells me that when she took Compazine it made her very tired and sleepy and therefore she did not take much of it.  She no longer has nausea.  She has lost a few pounds because she was not eating as much.  ALLERGIES:  is allergic to penicillins.  MEDICATIONS:  Current Outpatient Medications  Medication Sig Dispense Refill  . acetaminophen (TYLENOL) 325 MG tablet Take 650 mg by mouth every 6 (six) hours as needed.    Marland Kitchen ibuprofen (ADVIL) 800 MG tablet Take 1 tablet (800 mg total) by  mouth 3 (three) times daily. 21 tablet 0  . lidocaine-prilocaine (EMLA) cream Apply to affected area once 30 g 3  . LORazepam (ATIVAN) 0.5 MG tablet Take 1 tablet (0.5 mg total) by mouth at bedtime as needed (Nausea or vomiting). 30 tablet 0  . ondansetron (ZOFRAN) 8 MG tablet Take 1 tablet (8 mg total) by mouth 2 (two) times daily as needed. Start on the third day after chemotherapy. 30 tablet 1  . prochlorperazine (COMPAZINE) 10 MG tablet Take 1 tablet (10 mg total) by mouth every 6 (six) hours as needed (Nausea or vomiting). 30 tablet 1  . traMADol (ULTRAM) 50 MG tablet Take 1 tablet (50 mg total) by mouth every 6 (six) hours as needed. 12 tablet 0   No current facility-administered medications for this visit.    PHYSICAL EXAMINATION: ECOG PERFORMANCE STATUS: 1 - Symptomatic but completely ambulatory  Vitals:   09/11/19 1030  BP: (!) 147/102  Pulse: 81  Resp: 17  Temp: 98.2 F (36.8 C)  SpO2: 100%   Filed Weights   09/11/19 1030  Weight: 144 lb (65.3 kg)    LABORATORY DATA:  I have reviewed the data as listed CMP Latest Ref Rng & Units 09/04/2019 08/22/2019 09/03/2014  Glucose 70 - 99 mg/dL 86 104(H) 99  BUN 6 - 20 mg/dL 8 9 9   Creatinine 0.44 - 1.00 mg/dL 0.63 0.86 0.78  Sodium 135 - 145 mmol/L 140 141 138  Potassium 3.5 - 5.1 mmol/L 3.6 3.4(L) 4.0  Chloride 98 - 111 mmol/L 107 103 106  CO2 22 - 32 mmol/L 23 28 25   Calcium 8.9 - 10.3 mg/dL 9.1 8.9 9.0  Total Protein 6.5 - 8.1 g/dL 8.2(H) 7.8 7.5  Total Bilirubin 0.3 - 1.2 mg/dL 0.8 0.2(L) 0.4  Alkaline Phos 38 - 126 U/L 42 59 61  AST 15 - 41 U/L 24 18 19   ALT 0 - 44 U/L 17 11 15     Lab Results  Component Value Date   WBC 3.5 (L) 09/04/2019   HGB 12.6 09/04/2019   HCT 37.1 09/04/2019   MCV 90.0 09/04/2019   PLT 265 09/04/2019   NEUTROABS 1.9 09/04/2019    ASSESSMENT & PLAN:  Malignant neoplasm of overlapping sites of right breast in female, estrogen receptor positive ([place]) 08/17/19:Patient palpated a right  breast mass and right axilla mass and noted skin redness x2 weeks. She went to the ED and had an abscess drained with no improvement. Diagnostic mammogram and US showed a 7.3cm right breast mass extending into all 4 quadrants and partially into overlying skin with 12 abnormal right axillary lymph nodes. 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  Treatment plan: 1. Neoadjuvant chemotherapy with Adriamycin and Cytoxan dose dense 4 followed byTaxolweekly 12 2. Followed by breastmastectomy and axillary lymph node dissection 3. Followed by adjuvant radiation therapy 4.Follow-up adjuvant antiestrogen therapy  CT CAP 08/31/2019: Very large central right breast mass, bulky right axillary and subpectoral lymph nodes.  No evidence of distant metastatic disease.  Bulky uterine fibroids Bone scan 08/31/2019: No metastatic disease in the bones ----------------------------------------------------------------------------------------------------------------------------------------------------  Current treatment: Cycle 1 day 8 dose dense Adriamycin and Cytoxan will be tomorrow Echo: 08/29/2019: EF 60-65% Chemo toxicities: 1.  Nausea and vomiting: I suspect part of it is related to anesthesia that was given for the port.  I instructed her how to take the dexamethasone and I sent a new prescription because she took dexamethasone every day for the last week. 2.  Weight loss: I instructed her that it would improve this week since her taste and appetite are much better. 3.  Cytopenias: I will reduce the dosage of cycle 2 chemotherapy.  Return to clinic in 1 week for cycle 2     No orders of the defined types were placed in this encounter.  The patient has a good understanding of the overall plan. she agrees with it. she will call with any problems that may develop before the next visit here.  Total time spent: 30  mins including face to face time and time spent for planning, charting and coordination of care  [name], MD 09/11/2019  I, Cloyde Reams Dorshimer, am acting as scribe for Dr. [name].  I have reviewed the above documentation for accuracy and completeness, and I agree with the above.

## 2019-09-11 ENCOUNTER — Inpatient Hospital Stay (HOSPITAL_BASED_OUTPATIENT_CLINIC_OR_DEPARTMENT_OTHER): Payer: Medicaid Other | Admitting: Hematology and Oncology

## 2019-09-11 ENCOUNTER — Encounter: Payer: Self-pay | Admitting: *Deleted

## 2019-09-11 ENCOUNTER — Inpatient Hospital Stay: Payer: Medicaid Other

## 2019-09-11 ENCOUNTER — Other Ambulatory Visit: Payer: Self-pay

## 2019-09-11 DIAGNOSIS — Z170 Estrogen receptor positive status [ER+]: Secondary | ICD-10-CM

## 2019-09-11 DIAGNOSIS — C50811 Malignant neoplasm of overlapping sites of right female breast: Secondary | ICD-10-CM

## 2019-09-11 DIAGNOSIS — Z5111 Encounter for antineoplastic chemotherapy: Secondary | ICD-10-CM | POA: Diagnosis not present

## 2019-09-11 LAB — CBC WITH DIFFERENTIAL (CANCER CENTER ONLY)
Abs Immature Granulocytes: 0 10*3/uL (ref 0.00–0.07)
Band Neutrophils: 3 %
Basophils Absolute: 0 10*3/uL (ref 0.0–0.1)
Basophils Relative: 0 %
Eosinophils Absolute: 0 10*3/uL (ref 0.0–0.5)
Eosinophils Relative: 0 %
HCT: 33.8 % — ABNORMAL LOW (ref 36.0–46.0)
Hemoglobin: 11.6 g/dL — ABNORMAL LOW (ref 12.0–15.0)
Lymphocytes Relative: 20 %
Lymphs Abs: 0.2 10*3/uL — ABNORMAL LOW (ref 0.7–4.0)
MCH: 30.2 pg (ref 26.0–34.0)
MCHC: 34.3 g/dL (ref 30.0–36.0)
MCV: 88 fL (ref 80.0–100.0)
Metamyelocytes Relative: 2 %
Monocytes Absolute: 0.1 10*3/uL (ref 0.1–1.0)
Monocytes Relative: 5 %
Neutro Abs: 0.8 10*3/uL — ABNORMAL LOW (ref 1.7–7.7)
Neutrophils Relative %: 70 %
Platelet Count: 66 10*3/uL — ABNORMAL LOW (ref 150–400)
RBC: 3.84 MIL/uL — ABNORMAL LOW (ref 3.87–5.11)
RDW: 11.7 % (ref 11.5–15.5)
WBC Count: 1.1 10*3/uL — ABNORMAL LOW (ref 4.0–10.5)
nRBC: 0 % (ref 0.0–0.2)

## 2019-09-11 LAB — CMP (CANCER CENTER ONLY)
ALT: 6 U/L (ref 0–44)
AST: 10 U/L — ABNORMAL LOW (ref 15–41)
Albumin: 3.6 g/dL (ref 3.5–5.0)
Alkaline Phosphatase: 63 U/L (ref 38–126)
Anion gap: 8 (ref 5–15)
BUN: 5 mg/dL — ABNORMAL LOW (ref 6–20)
CO2: 25 mmol/L (ref 22–32)
Calcium: 8.4 mg/dL — ABNORMAL LOW (ref 8.9–10.3)
Chloride: 103 mmol/L (ref 98–111)
Creatinine: 0.68 mg/dL (ref 0.44–1.00)
GFR, Est AFR Am: >60 mL/min (ref >60)
GFR, Estimated: >60 mL/min (ref >60)
Glucose, Bld: 113 mg/dL — ABNORMAL HIGH (ref 70–99)
Potassium: 3.5 mmol/L (ref 3.5–5.1)
Sodium: 136 mmol/L (ref 135–145)
Total Bilirubin: 0.4 mg/dL (ref 0.3–1.2)
Total Protein: 6.9 g/dL (ref 6.5–8.1)

## 2019-09-11 MED ORDER — DEXAMETHASONE 4 MG PO TABS: 4.0000 mg | ORAL_TABLET | Freq: Every day | ORAL | 0 refills | Status: AC

## 2019-09-11 NOTE — Assessment & Plan Note (Signed)
08/17/19:Patient palpated a right breast mass and right axilla mass and noted skin redness x2 weeks. She went to the ED and had an abscess drained with no improvement. Diagnostic mammogram and US showed a 7.3cm right breast mass extending into all 4 quadrants and partially into overlying skin with 12 abnormal right axillary lymph nodes. 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  Treatment plan: 1. Neoadjuvant chemotherapy with Adriamycin and Cytoxan dose dense 4 followed byTaxolweekly 12 2. Followed by breastmastectomy and axillary lymph node dissection 3. Followed by adjuvant radiation therapy 4.Follow-up adjuvant antiestrogen therapy  CT CAP 08/31/2019: Very large central right breast mass, bulky right axillary and subpectoral lymph nodes.  No evidence of distant metastatic disease.  Bulky uterine fibroids Bone scan 08/31/2019: No metastatic disease in the bones ---------------------------------------------------------------------------------------------------------------------------------------------------- Current treatment: Cycle 1 day 8 dose dense Adriamycin and Cytoxan will be tomorrow Echo: 08/29/2019: EF 60-65% Chemo toxicities:   Return to clinic in 1 week for cycle 2  

## 2019-09-13 ENCOUNTER — Ambulatory Visit: Payer: Self-pay | Admitting: Genetic Counselor

## 2019-09-13 ENCOUNTER — Inpatient Hospital Stay: Payer: Medicaid Other | Admitting: Genetic Counselor

## 2019-09-13 DIAGNOSIS — Z1379 Encounter for other screening for genetic and chromosomal anomalies: Secondary | ICD-10-CM

## 2019-09-13 NOTE — Progress Notes (Signed)
[name] was previously seen for a genetics consultation in the [facility] due to a personal and family history of breast cancer and concerns regarding a hereditary predisposition to cancer. Please refer to our prior cancer genetics clinic note for more information regarding our discussion, assessment and recommendations, at the time.   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 This result was initially disclosed to [name] by phone on 09/05/2019.  [name] presented today to discuss her genetic results. She let me know that she is not in a mental space to receive bad news at this time, and that she would like to delay the discussion of her genetic results. We understand that receiving positive genetic test results can be emotionally overwhelming and will plan to reach out in a couple of weeks to see if she is in a better state of mind to have this conversation. I provided [name] with my contact information and encouraged her to reach out should she have any questions, or if she would like to learn more about her result, in the meantime.   Clint Guy, [place], [facility] Licensed, Certified Dispensing [email or web address] Phone: [phone]

## 2019-09-17 NOTE — Progress Notes (Signed)
Patient Care Team: Default, Provider, MD as PCP - General Gina Kaufmann, RN as Oncology Nurse Navigator [name], RN as Oncology Nurse Navigator [name], MD as Consulting Physician (General Surgery) [name], MD as Consulting Physician (Hematology and Oncology) [name], MD as Consulting Physician (Radiation Oncology)  DIAGNOSIS:    ICD-10-CM   1. Malignant neoplasm of overlapping sites of right breast in female, estrogen receptor positive ([place])  C50.811    Z17.0     SUMMARY OF ONCOLOGIC HISTORY: Oncology History  Malignant neoplasm of overlapping sites of right breast in female, estrogen receptor positive ([place])  08/17/2019 Initial Diagnosis   Patient palpated a right breast mass and right axilla mass and noted skin redness x2 weeks. She went to the ED and had an abscess drained with no improvement. Diagnostic mammogram and US showed a 7.3cm right breast mass extending into all 4 quadrants and partially into overlying skin with 12 abnormal right axillary lymph nodes. Biopsy  showed IDC with necrosis in the breast and axilla, grade 3, HER-2 equivocal by IHC, negative by FISH, ER+ 40% weak, PR -, Ki67 90%.    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 The report date is 09/03/2019.  The Common Hereditary Cancers Panel offered by [facility] includes sequencing and/or deletion duplication testing of the following 48 genes: APC, ATM, AXIN2, BARD1, BMPR1A, BRCA1, BRCA2, BRIP1, CDH1, CDK4, CDKN2A (p14ARF), CDKN2A (p16INK4a), CHEK2, CTNNA1, DICER1, EPCAM (Deletion/duplication testing only), GREM1 (promoter region deletion/duplication testing only), KIT, MEN1, MLH1, MSH2, MSH3, MSH6, MUTYH, NBN, NF1, NHTL1, PALB2, PDGFRA, PMS2, POLD1, POLE, PTEN, RAD50, RAD51C, RAD51D, RNF43, SDHB, SDHC, SDHD, SMAD4, SMARCA4. STK11, TP53, TSC1, TSC2, and  VHL.  The following genes were evaluated for sequence changes only: SDHA and HOXB13 c.251G>A variant only.    09/04/2019 -  Chemotherapy   The patient had DOXOrubicin (ADRIAMYCIN) chemo injection 110 mg, 60 mg/m2 = 110 mg, Intravenous,  Once, 1 of 4 cycles Dose modification: 50 mg/m2 (original dose 60 mg/m2, Cycle 2, Reason: Provider Judgment) Administration: 110 mg (09/04/2019) palonosetron (ALOXI) injection 0.25 mg, 0.25 mg, Intravenous,  Once, 1 of 4 cycles Administration: 0.25 mg (09/04/2019) pegfilgrastim-cbqv (UDENYCA) injection 6 mg, 6 mg, Subcutaneous, Once, 1 of 4 cycles Administration: 6 mg (09/06/2019) cyclophosphamide (CYTOXAN) 1,100 mg in sodium chloride 0.9 % 250 mL chemo infusion, 600 mg/m2 = 1,100 mg, Intravenous,  Once, 1 of 4 cycles Dose modification: 500 mg/m2 (original dose 600 mg/m2, Cycle 2, Reason: Provider Judgment) Administration: 1,100 mg (09/04/2019) PACLitaxel (TAXOL) 144 mg in sodium chloride 0.9 % 250 mL chemo infusion (</= 76m/m2), 80 mg/m2 = 144 mg, Intravenous,  Once, 0 of 12 cycles fosaprepitant (EMEND) 150 mg in sodium chloride 0.9 % 145 mL IVPB, 150 mg, Intravenous,  Once, 1 of 4 cycles Administration: 150 mg (09/04/2019)  for chemotherapy treatment.      CHIEF COMPLIANT: Cycle 2 Adriamycin and Cytoxan  INTERVAL HISTORY: Gina Ross a 49y.o. with above-mentioned history of right breast cancer currently on neoadjuvant chemotherapy with dose dense Adriamycin and Cytoxan. She presents to the clinic todayfor a toxicity check following cycle 2.   She did very well over the past week.  Had excellent appetite and was eating better.  She no longer had nausea.  Any headaches or blurred vision or lightheadedness.  Denies any constipation or diarrhea.  ALLERGIES:  is allergic to  penicillins.  MEDICATIONS:  Current Outpatient Medications  Medication Sig Dispense Refill  . acetaminophen (TYLENOL) 325 MG tablet Take 650 mg by mouth every 6 (six) hours as needed.      Marland Kitchen ibuprofen (ADVIL) 800 MG tablet Take 1 tablet (800 mg total) by mouth 3 (three) times daily. 21 tablet 0  . lidocaine-prilocaine (EMLA) cream Apply to affected area once 30 g 3  . LORazepam (ATIVAN) 0.5 MG tablet Take 1 tablet (0.5 mg total) by mouth at bedtime as needed (Nausea or vomiting). 30 tablet 0  . ondansetron (ZOFRAN) 8 MG tablet Take 1 tablet (8 mg total) by mouth 2 (two) times daily as needed. Start on the third day after chemotherapy. 30 tablet 1  . prochlorperazine (COMPAZINE) 10 MG tablet Take 1 tablet (10 mg total) by mouth every 6 (six) hours as needed (Nausea or vomiting). 30 tablet 1  . traMADol (ULTRAM) 50 MG tablet Take 1 tablet (50 mg total) by mouth every 6 (six) hours as needed. 12 tablet 0   No current facility-administered medications for this visit.    PHYSICAL EXAMINATION: ECOG PERFORMANCE STATUS: 1 - Symptomatic but completely ambulatory  Vitals:   09/18/19 0817  BP: (!) 150/105  Pulse: 88  Resp: 17  Temp: 98.5 F (36.9 C)  SpO2: 100%   Filed Weights   09/18/19 0817  Weight: 142 lb 8 oz (64.6 kg)    LABORATORY DATA:  I have reviewed the data as listed CMP Latest Ref Rng & Units 09/11/2019 09/04/2019 08/22/2019  Glucose 70 - 99 mg/dL 113(H) 86 104(H)  BUN 6 - 20 mg/dL 5(L) 8 9  Creatinine 0.44 - 1.00 mg/dL 0.68 0.63 0.86  Sodium 135 - 145 mmol/L 136 140 141  Potassium 3.5 - 5.1 mmol/L 3.5 3.6 3.4(L)  Chloride 98 - 111 mmol/L 103 107 103  CO2 22 - 32 mmol/L 25 23 28   Calcium 8.9 - 10.3 mg/dL 8.4(L) 9.1 8.9  Total Protein 6.5 - 8.1 g/dL 6.9 8.2(H) 7.8  Total Bilirubin 0.3 - 1.2 mg/dL 0.4 0.8 0.2(L)  Alkaline Phos 38 - 126 U/L 63 42 59  AST 15 - 41 U/L 10(L) 24 18  ALT 0 - 44 U/L 6 17 11     Lab Results  Component Value Date   WBC 13.9 (H) 09/18/2019   HGB 10.5 (L) 09/18/2019   HCT 30.5 (L) 09/18/2019   MCV 87.9 09/18/2019   PLT 363 09/18/2019   NEUTROABS PENDING 09/18/2019    ASSESSMENT & PLAN:  Malignant neoplasm of overlapping sites  of right breast in female, estrogen receptor positive ([place]) 08/17/19:Patient palpated a right breast mass and right axilla mass and noted skin redness x2 weeks. She went to the ED and had an abscess drained with no improvement. Diagnostic mammogram and US showed a 7.3cm right breast mass extending into all 4 quadrants and partially into overlying skin with 12 abnormal right axillary lymph nodes. 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  Treatment plan: 1. Neoadjuvant chemotherapy with Adriamycin and Cytoxan dose dense 4 followed byTaxolweekly 12 2. Followed by breastmastectomy and axillary lymph node dissection 3. Followed by adjuvant radiation therapy 4.Follow-up adjuvant antiestrogen therapy  CT CAP 08/31/2019: Very large central right breast mass, bulky right axillary and subpectoral lymph nodes. No evidence of distant metastatic disease. Bulky uterine fibroids Bone scan 08/31/2019: No metastatic disease in the bones ---------------------------------------------------------------------------------------------------------------------------------------------------- Current  treatment:Cycle 2 dose dense Adriamycin and Cytoxan Echo: 08/29/2019: EF60-65% Chemo toxicities: 1.  Nausea and vomiting: Will be monitored closely. 2.  Weight loss: I instructed her that it would improve this week since her taste and appetite are much better. 3.  Cytopenias: We reduced the dosage of cycle 2 chemotherapy.  Return to clinic in 2 week for cycle 3    No orders of the defined types were placed in this encounter.  The patient has a good understanding of the overall plan. she agrees with it. she will call with any problems that may develop before the next visit here.  Total time spent: 30 mins including face to face time and time spent for planning, charting and coordination of care  [name],  MD 09/18/2019  I, Cloyde Reams Dorshimer, am acting as scribe for Dr. [name].  I have reviewed the above documentation for accuracy and completeness, and I agree with the above.

## 2019-09-18 ENCOUNTER — Encounter: Payer: Self-pay | Admitting: Hematology and Oncology

## 2019-09-18 ENCOUNTER — Inpatient Hospital Stay (HOSPITAL_BASED_OUTPATIENT_CLINIC_OR_DEPARTMENT_OTHER): Payer: Medicaid Other | Admitting: Hematology and Oncology

## 2019-09-18 ENCOUNTER — Inpatient Hospital Stay: Payer: Medicaid Other

## 2019-09-18 ENCOUNTER — Encounter: Payer: Self-pay | Admitting: *Deleted

## 2019-09-18 ENCOUNTER — Inpatient Hospital Stay: Payer: Medicaid Other | Admitting: Licensed Clinical Social Worker

## 2019-09-18 ENCOUNTER — Other Ambulatory Visit: Payer: Self-pay

## 2019-09-18 DIAGNOSIS — C50811 Malignant neoplasm of overlapping sites of right female breast: Secondary | ICD-10-CM

## 2019-09-18 DIAGNOSIS — Z5111 Encounter for antineoplastic chemotherapy: Secondary | ICD-10-CM | POA: Diagnosis not present

## 2019-09-18 DIAGNOSIS — Z170 Estrogen receptor positive status [ER+]: Secondary | ICD-10-CM

## 2019-09-18 DIAGNOSIS — Z95828 Presence of other vascular implants and grafts: Secondary | ICD-10-CM | POA: Insufficient documentation

## 2019-09-18 LAB — CBC WITH DIFFERENTIAL (CANCER CENTER ONLY)
Abs Immature Granulocytes: 0.93 10*3/uL — ABNORMAL HIGH (ref 0.00–0.07)
Basophils Absolute: 0.1 10*3/uL (ref 0.0–0.1)
Basophils Relative: 0 %
Eosinophils Absolute: 0 10*3/uL (ref 0.0–0.5)
Eosinophils Relative: 0 %
HCT: 30.5 % — ABNORMAL LOW (ref 36.0–46.0)
Hemoglobin: 10.5 g/dL — ABNORMAL LOW (ref 12.0–15.0)
Immature Granulocytes: 7 %
Lymphocytes Relative: 8 %
Lymphs Abs: 1.2 10*3/uL (ref 0.7–4.0)
MCH: 30.3 pg (ref 26.0–34.0)
MCHC: 34.4 g/dL (ref 30.0–36.0)
MCV: 87.9 fL (ref 80.0–100.0)
Monocytes Absolute: 1.2 10*3/uL — ABNORMAL HIGH (ref 0.1–1.0)
Monocytes Relative: 9 %
Neutro Abs: 10.5 10*3/uL — ABNORMAL HIGH (ref 1.7–7.7)
Neutrophils Relative %: 76 %
Platelet Count: 363 10*3/uL (ref 150–400)
RBC: 3.47 MIL/uL — ABNORMAL LOW (ref 3.87–5.11)
RDW: 11.7 % (ref 11.5–15.5)
WBC Count: 13.9 10*3/uL — ABNORMAL HIGH (ref 4.0–10.5)
nRBC: 0.1 % (ref 0.0–0.2)

## 2019-09-18 LAB — CMP (CANCER CENTER ONLY)
ALT: 11 U/L (ref 0–44)
AST: 13 U/L — ABNORMAL LOW (ref 15–41)
Albumin: 3.3 g/dL — ABNORMAL LOW (ref 3.5–5.0)
Alkaline Phosphatase: 77 U/L (ref 38–126)
Anion gap: 11 (ref 5–15)
BUN: 4 mg/dL — ABNORMAL LOW (ref 6–20)
CO2: 24 mmol/L (ref 22–32)
Calcium: 8.9 mg/dL (ref 8.9–10.3)
Chloride: 105 mmol/L (ref 98–111)
Creatinine: 0.69 mg/dL (ref 0.44–1.00)
GFR, Est AFR Am: >60 mL/min (ref >60)
GFR, Estimated: >60 mL/min (ref >60)
Glucose, Bld: 102 mg/dL — ABNORMAL HIGH (ref 70–99)
Potassium: 3.4 mmol/L — ABNORMAL LOW (ref 3.5–5.1)
Sodium: 140 mmol/L (ref 135–145)
Total Bilirubin: <0.2 mg/dL — ABNORMAL LOW (ref 0.3–1.2)
Total Protein: 7.1 g/dL (ref 6.5–8.1)

## 2019-09-18 MED ORDER — SODIUM CHLORIDE 0.9 % IV SOLN
Freq: Once | INTRAVENOUS | Status: AC
  Filled 2019-09-18: qty 250

## 2019-09-18 MED ORDER — SODIUM CHLORIDE 0.9% FLUSH
10.0000 mL | INTRAVENOUS | Status: DC | PRN
  Administered 2019-09-18: 10 mL
  Filled 2019-09-18: qty 10

## 2019-09-18 MED ORDER — HEPARIN SOD (PORK) LOCK FLUSH 100 UNIT/ML IV SOLN
500.0000 [IU] | Freq: Once | INTRAVENOUS | Status: AC | PRN
  Administered 2019-09-18: 500 [IU]
  Filled 2019-09-18: qty 5

## 2019-09-18 MED ORDER — PALONOSETRON HCL INJECTION 0.25 MG/5ML
0.2500 mg | Freq: Once | INTRAVENOUS | Status: AC
  Administered 2019-09-18: 0.25 mg via INTRAVENOUS

## 2019-09-18 MED ORDER — PALONOSETRON HCL INJECTION 0.25 MG/5ML
INTRAVENOUS | Status: AC
  Filled 2019-09-18: qty 5

## 2019-09-18 MED ORDER — SODIUM CHLORIDE 0.9 % IV SOLN
150.0000 mg | Freq: Once | INTRAVENOUS | Status: AC
  Administered 2019-09-18: 150 mg via INTRAVENOUS
  Filled 2019-09-18: qty 150

## 2019-09-18 MED ORDER — DEXAMETHASONE SODIUM PHOSPHATE 10 MG/ML IJ SOLN
INTRAMUSCULAR | Status: AC
  Filled 2019-09-18: qty 1

## 2019-09-18 MED ORDER — SODIUM CHLORIDE 0.9 % IV SOLN
500.0000 mg/m2 | Freq: Once | INTRAVENOUS | Status: AC
  Administered 2019-09-18: 920 mg via INTRAVENOUS
  Filled 2019-09-18: qty 46

## 2019-09-18 MED ORDER — DOXORUBICIN HCL CHEMO IV INJECTION 2 MG/ML
50.0000 mg/m2 | Freq: Once | INTRAVENOUS | Status: AC
  Administered 2019-09-18: 10:00:00 92 mg via INTRAVENOUS
  Filled 2019-09-18: qty 46

## 2019-09-18 MED ORDER — DEXAMETHASONE SODIUM PHOSPHATE 10 MG/ML IJ SOLN
10.0000 mg | Freq: Once | INTRAMUSCULAR | Status: AC
  Administered 2019-09-18: 10 mg via INTRAVENOUS

## 2019-09-18 NOTE — Progress Notes (Signed)
Nutrition Assessment:  Patient identified from attending Breast Clinic also noted to have weight loss.  49 year old female with right breast cancer.  Past medical history reviewed.  Patient receiving neoadjuvant chemotherapy.    Met with patient during infusion to introduce self and service at [facility].  Patient reports poor appetite with first round of chemotherapy (nausea) unable to eat.  Patient reports appetite has improved and eating better. Likes all types of foods  Medications: ativan, zofran, compazine  Labs: K 3.4, glucose 102  Anthropometrics:   Height: 69 inches Weight: 142 lb 8 oz today 150 lb on 08/14/19 BMI: 21  5% weight loss in the last month, significant   Estimated Energy Needs  Kcals: 4166-0630 Protein: 98-114 g Fluid: > 1.9 L  NUTRITION DIAGNOSIS: Inadequate oral intake related to cancer related treatment side effects as evidenced by 5% weight loss and poor appetite   INTERVENTION:  Discussed importance of taking nausea medications.  Reviewed strategies to help with nausea.  Handout provided to patient.  Discussed foods high in potassium due to low level.  Also discussed foods rich in protein and to include at every meal.   Contact information given to patient.      MONITORING, EVALUATION, GOAL: Patient will consume adequate calories and protein to prevent weight loss during treatment   NEXT VISIT: April 20 during infusion  Beverlee Wilmarth B. Zenia Resides, [place], [place] Registered Dietitian [phone] (pager)

## 2019-09-18 NOTE — Assessment & Plan Note (Addendum)
08/17/19:Patient palpated a right breast mass and right axilla mass and noted skin redness x2 weeks. She went to the ED and had an abscess drained with no improvement. Diagnostic mammogram and US showed a 7.3cm right breast mass extending into all 4 quadrants and partially into overlying skin with 12 abnormal right axillary lymph nodes. 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  Treatment plan: 1. Neoadjuvant chemotherapy with Adriamycin and Cytoxan dose dense 4 followed byTaxolweekly 12 2. Followed by breastmastectomy and axillary lymph node dissection 3. Followed by adjuvant radiation therapy 4.Follow-up adjuvant antiestrogen therapy  CT CAP 08/31/2019: Very large central right breast mass, bulky right axillary and subpectoral lymph nodes. No evidence of distant metastatic disease. Bulky uterine fibroids Bone scan 08/31/2019: No metastatic disease in the bones ---------------------------------------------------------------------------------------------------------------------------------------------------- Current treatment:Cycle 2 dose dense Adriamycin and Cytoxan Echo: 08/29/2019: EF60-65% Chemo toxicities: 1.  Nausea and vomiting: Will be monitored closely. 2.  Weight loss: I instructed her that it would improve this week since her taste and appetite are much better. 3.  Cytopenias: We reduced the dosage of cycle 2 chemotherapy.  Return to clinic in 2 week for cycle 3

## 2019-09-18 NOTE — Progress Notes (Signed)
[facility] CSW Progress Note  Holiday representative met with patient in infusion to provide ongoing support. Patient more alert today and coping better emotionally. Asking about more financial resources as family is driving her to appointments but gas is expensive. Signed up for [employer] and gave first disbursement today. Also explained and gave [employer] application.   Pt has also spoken with financial advocate, Loreta Ave, about [employer]. She is still working on her disability application- believes paperwork should have been sent to the office requesting medical records.  CSW will continue to check-in periodically throughout treatment for ongoing emotional and resource support.    Edwinna Areola Marguerite Barba LCSW

## 2019-09-18 NOTE — Progress Notes (Signed)
Met w/ pt to introduce myself as her Arboriculturist.  Pt's ins should pay for her treatment at 100% so copay assistance shouldn't be needed.  I offered the [employer], went over what it covers, gave her the income requirement and an expense sheet.  She would like to apply so she will provide proof of income.  She has my card for any questions or concerns she may have in the future.

## 2019-09-18 NOTE — Patient Instructions (Addendum)
[facility] Discharge Instructions for Patients Receiving Chemotherapy  Today you received the following chemotherapy agents: doxorubicin and cyclophosphamide.  To help prevent nausea and vomiting after your treatment, we encourage you to take your nausea medication as directed.   If you develop nausea and vomiting that is not controlled by your nausea medication, call the clinic.   BELOW ARE SYMPTOMS THAT SHOULD BE REPORTED IMMEDIATELY:  *FEVER GREATER THAN 100.5 F  *CHILLS WITH OR WITHOUT FEVER  NAUSEA AND VOMITING THAT IS NOT CONTROLLED WITH YOUR NAUSEA MEDICATION  *UNUSUAL SHORTNESS OF BREATH  *UNUSUAL BRUISING OR BLEEDING  TENDERNESS IN MOUTH AND THROAT WITH OR WITHOUT PRESENCE OF ULCERS  *URINARY PROBLEMS  *BOWEL PROBLEMS  UNUSUAL RASH Items with * indicate a potential emergency and should be followed up as soon as possible.  Feel free to call the clinic should you have any questions or concerns. The clinic phone number is [phone].  Please show the [place] at check-in to the Emergency Department and triage nurse.   Hypokalemia Hypokalemia means that the amount of potassium in the blood is lower than normal. Potassium is a chemical (electrolyte) that helps regulate the amount of fluid in the body. It also stimulates muscle tightening (contraction) and helps nerves work properly. Normally, most of the body's potassium is inside cells, and only a very small amount is in the blood. Because the amount in the blood is so small, minor changes to potassium levels in the blood can be life-threatening. What are the causes? This condition may be caused by:  Antibiotic medicine.  Diarrhea or vomiting. Taking too much of a medicine that helps you have a bowel movement (laxative) can cause diarrhea and lead to hypokalemia.  Chronic kidney disease (CKD).  Medicines that help the body get rid of excess fluid (diuretics).  Eating disorders, such as  bulimia.  Low magnesium levels in the body.  Sweating a lot. What are the signs or symptoms? Symptoms of this condition include:  Weakness.  Constipation.  Fatigue.  Muscle cramps.  Mental confusion.  Skipped heartbeats or irregular heartbeat (palpitations).  Tingling or numbness. How is this diagnosed? This condition is diagnosed with a blood test. How is this treated? This condition may be treated by:  Taking potassium supplements by mouth.  Adjusting the medicines that you take.  Eating more foods that contain a lot of potassium. If your potassium level is very low, you may need to get potassium through an IV and be monitored in the hospital. Follow these instructions at home:   Take over-the-counter and prescription medicines only as told by your health care provider. This includes vitamins and supplements.  Eat a healthy diet. A healthy diet includes fresh fruits and vegetables, whole grains, healthy fats, and lean proteins.  If instructed, eat more foods that contain a lot of potassium. This includes: ? Nuts, such as peanuts and pistachios. ? Seeds, such as sunflower seeds and pumpkin seeds. ? Peas, lentils, and lima beans. ? Whole grain and bran cereals and breads. ? Fresh fruits and vegetables, such as apricots, avocado, bananas, cantaloupe, kiwi, oranges, tomatoes, asparagus, and potatoes. ? Orange juice. ? Tomato juice. ? Red meats. ? Yogurt.  Keep all follow-up visits as told by your health care provider. This is important. Contact a health care provider if you:  Have weakness that gets worse.  Feel your heart pounding or racing.  Vomit.  Have diarrhea.  Have diabetes (diabetes mellitus) and you have trouble  keeping your blood sugar (glucose) in your target range. Get help right away if you:  Have chest pain.  Have shortness of breath.  Have vomiting or diarrhea that lasts for more than 2 days.  Faint. Summary  Hypokalemia means that  the amount of potassium in the blood is lower than normal.  This condition is diagnosed with a blood test.  Hypokalemia may be treated by taking potassium supplements, adjusting the medicines that you take, or eating more foods that are high in potassium.  If your potassium level is very low, you may need to get potassium through an IV and be monitored in the hospital. This information is not intended to replace advice given to you by your health care provider. Make sure you discuss any questions you have with your health care provider. Document Revised: 01/25/2018 Document Reviewed: 01/25/2018 Elsevier Patient Education  [place].

## 2019-09-18 NOTE — Progress Notes (Signed)
Per Dr Lindi Adie, ok to treat with elevated BP today.

## 2019-09-18 NOTE — Progress Notes (Signed)
Pt is approved for the $1000 [employer] grant.  

## 2019-09-19 ENCOUNTER — Inpatient Hospital Stay: Admission: RE | Admit: 2019-09-19 | Payer: Medicaid Other | Source: Ambulatory Visit

## 2019-09-20 ENCOUNTER — Inpatient Hospital Stay: Payer: Medicaid Other

## 2019-09-20 ENCOUNTER — Other Ambulatory Visit: Payer: Self-pay

## 2019-09-20 ENCOUNTER — Encounter: Payer: Self-pay | Admitting: Licensed Clinical Social Worker

## 2019-09-20 ENCOUNTER — Ambulatory Visit: Payer: Medicaid Other

## 2019-09-20 VITALS — BP 126/78 | HR 78 | Temp 98.0°F | Resp 18

## 2019-09-20 DIAGNOSIS — Z5111 Encounter for antineoplastic chemotherapy: Secondary | ICD-10-CM | POA: Diagnosis not present

## 2019-09-20 DIAGNOSIS — C50811 Malignant neoplasm of overlapping sites of right female breast: Secondary | ICD-10-CM

## 2019-09-20 MED ORDER — PEGFILGRASTIM-CBQV 6 MG/0.6ML ~~LOC~~ SOSY
PREFILLED_SYRINGE | SUBCUTANEOUS | Status: AC
  Filled 2019-09-20: qty 0.6

## 2019-09-20 MED ORDER — PEGFILGRASTIM-CBQV 6 MG/0.6ML ~~LOC~~ SOSY
6.0000 mg | PREFILLED_SYRINGE | Freq: Once | SUBCUTANEOUS | Status: AC
  Administered 2019-09-20: 6 mg via SUBCUTANEOUS

## 2019-09-20 NOTE — Progress Notes (Signed)
[facility] CSW Progress Note  Clinical Education officer, museum received patient's completed [employer] application. Medical letter written and application faxed to [facility]. They will contact patient directly regarding approval.    Edwinna Areola Herbert Marken, LCSW

## 2019-09-20 NOTE — Patient Instructions (Signed)

## 2019-09-26 ENCOUNTER — Other Ambulatory Visit: Payer: Medicaid Other

## 2019-09-26 ENCOUNTER — Inpatient Hospital Stay: Admission: RE | Admit: 2019-09-26 | Payer: Medicaid Other | Source: Ambulatory Visit

## 2019-09-26 NOTE — Progress Notes (Signed)
Pharmacist Chemotherapy Monitoring - Follow Up Assessment    I verify that I have reviewed each item in the below checklist:  . Regimen for the patient is scheduled for the appropriate day and plan matches scheduled date. Marland Kitchen Appropriate non-routine labs are ordered dependent on drug ordered. . If applicable, additional medications reviewed and ordered per protocol based on lifetime cumulative doses and/or treatment regimen.   Plan for follow-up and/or issues identified: No . I-vent associated with next due treatment: No . MD and/or nursing notified: No  Gina Ross 09/26/2019 2:36 PM

## 2019-10-01 NOTE — Progress Notes (Signed)
Gina Ross Care Team: Gina Ross, [name] as PCP - General (General Practice) [name], RN as Oncology Nurse Navigator [name], RN as Oncology Nurse Navigator [name], MD as Consulting Physician (General Surgery) [name], MD as Consulting Physician (Hematology and Oncology) [name], MD as Consulting Physician (Radiation Oncology)  DIAGNOSIS:    ICD-10-CM   1. 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 She went to the ED and had an abscess drained with no improvement. Diagnostic mammogram and US showed a 7.3cm right breast mass extending into all 4 quadrants and partially into overlying skin with 12 abnormal right axillary lymph nodes. Biopsy  showed IDC with necrosis in the breast and axilla, grade 3, HER-2 equivocal by IHC, negative by FISH, ER+ 40% weak, PR -, Ki67 90%.    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 The report date is 09/03/2019.  The Common Hereditary Cancers Panel offered by [facility] includes sequencing and/or deletion duplication testing of the following 48 genes: APC, ATM, AXIN2, BARD1, BMPR1A, BRCA1, BRCA2, BRIP1, CDH1, CDK4, CDKN2A (p14ARF), CDKN2A (p16INK4a), CHEK2, CTNNA1, DICER1, EPCAM (Deletion/duplication testing only), GREM1 (promoter region deletion/duplication testing only), KIT, MEN1, MLH1, MSH2, MSH3, MSH6, MUTYH, NBN, NF1, NHTL1, PALB2, PDGFRA, PMS2, POLD1, POLE, PTEN, RAD50, RAD51C, RAD51D, RNF43, SDHB, SDHC, SDHD, SMAD4, SMARCA4. STK11, TP53,  TSC1, TSC2, and VHL.  The following genes were evaluated for sequence changes only: SDHA and HOXB13 c.251G>A variant only.    09/04/2019 -  Chemotherapy   The Gina Ross had DOXOrubicin (ADRIAMYCIN) chemo injection 110 mg, 60 mg/m2 = 110 mg, Intravenous,  Once, 2 of 4 cycles Dose modification: 50 mg/m2 (original dose 60 mg/m2, Cycle 2, Reason: Provider Judgment) Administration: 110 mg (09/04/2019), 92 mg (09/18/2019) palonosetron (ALOXI) injection 0.25 mg, 0.25 mg, Intravenous,  Once, 2 of 4 cycles Administration: 0.25 mg (09/04/2019), 0.25 mg (09/18/2019) pegfilgrastim-cbqv (UDENYCA) injection 6 mg, 6 mg, Subcutaneous, Once, 2 of 4 cycles Administration: 6 mg (09/06/2019), 6 mg (09/20/2019) cyclophosphamide (CYTOXAN) 1,100 mg in sodium chloride 0.9 % 250 mL chemo infusion, 600 mg/m2 = 1,100 mg, Intravenous,  Once, 2 of 4 cycles Dose modification: 500 mg/m2 (original dose 600 mg/m2, Cycle 2, Reason: Provider Judgment) Administration: 1,100 mg (09/04/2019), 920 mg (09/18/2019) PACLitaxel (TAXOL) 144 mg in sodium chloride 0.9 % 250 mL chemo infusion (</= 86m/m2), 80 mg/m2 = 144 mg, Intravenous,  Once, 0 of 12 cycles fosaprepitant (EMEND) 150 mg in sodium chloride 0.9 % 145 mL IVPB, 150 mg, Intravenous,  Once, 2 of 4 cycles Administration: 150 mg (09/04/2019), 150 mg (09/18/2019)  for chemotherapy treatment.      CHIEF COMPLIANT: Cycle 3Adriamycin and Cytoxan  INTERVAL HISTORY: URADHIKA DERSHEMis a 49y.o. with above-mentioned history of right breast cancer currently on neoadjuvant chemotherapy with dose dense Adriamycin and Cytoxan. She presents to the clinic todayfora toxicity check and cycle 3. After she receives the [place] she feels nauseated for 2 to 3 days.  She has recovered from all of the symptoms.  She does not have  any profound fatigue or weakness.  ALLERGIES:  is allergic to penicillins.  MEDICATIONS:  Current Outpatient Medications  Medication Sig Dispense Refill  .  acetaminophen (TYLENOL) 325 MG tablet Take 650 mg by mouth every 6 (six) hours as needed.    Marland Kitchen ibuprofen (ADVIL) 800 MG tablet Take 1 tablet (800 mg total) by mouth 3 (three) times daily. 21 tablet 0  . lidocaine-prilocaine (EMLA) cream Apply to affected area once 30 g 3  . LORazepam (ATIVAN) 0.5 MG tablet Take 1 tablet (0.5 mg total) by mouth at bedtime as needed (Nausea or vomiting). 30 tablet 0  . ondansetron (ZOFRAN) 8 MG tablet Take 1 tablet (8 mg total) by mouth 2 (two) times daily as needed. Start on the third day after chemotherapy. 30 tablet 1  . prochlorperazine (COMPAZINE) 10 MG tablet Take 1 tablet (10 mg total) by mouth every 6 (six) hours as needed (Nausea or vomiting). 30 tablet 1  . traMADol (ULTRAM) 50 MG tablet Take 1 tablet (50 mg total) by mouth every 6 (six) hours as needed. 12 tablet 0   No current facility-administered medications for this visit.   Facility-Administered Medications Ordered in Other Visits  Medication Dose Route Frequency Provider Last Rate Last Admin  . sodium chloride flush (NS) 0.9 % injection 10 mL  10 mL Intracatheter PRN [name], MD   10 mL at 10/02/19 1255    PHYSICAL EXAMINATION: ECOG PERFORMANCE STATUS: 1 - Symptomatic but completely ambulatory  There were no vitals filed for this visit. There were no vitals filed for this visit.  LABORATORY DATA:  I have reviewed the data as listed CMP Latest Ref Rng & Units 09/18/2019 09/11/2019 09/04/2019  Glucose 70 - 99 mg/dL 102(H) 113(H) 86  BUN 6 - 20 mg/dL 4(L) 5(L) 8  Creatinine 0.44 - 1.00 mg/dL 0.69 0.68 0.63  Sodium 135 - 145 mmol/L 140 136 140  Potassium 3.5 - 5.1 mmol/L 3.4(L) 3.5 3.6  Chloride 98 - 111 mmol/L 105 103 107  CO2 22 - 32 mmol/L _0 Calcium 8.9 - 10.3 mg/dL 8.9 8.4(L) 9.1  Total Protein 6.5 - 8.1 g/dL 7.1 6.9 8.2(H)  Total Bilirubin 0.3 - 1.2 mg/dL <0.2(L) 0.4 0.8  Alkaline Phos 38 - 126 U/L 77 63 42  AST 15 - 41 U/L 13(L) 10(L) 24  ALT 0 - 44 U/L _1 Lab  Results  Component Value Date   WBC 13.9 (H) 09/18/2019   HGB 10.5 (L) 09/18/2019   HCT 30.5 (L) 09/18/2019   MCV 87.9 09/18/2019   PLT 363 09/18/2019   NEUTROABS 10.5 (H) 09/18/2019    ASSESSMENT & PLAN:  Malignant neoplasm of overlapping sites of right breast in female, estrogen receptor positive ([place]) 08/17/19:Gina Ross palpated a right breast mass and right axilla mass and noted skin redness x2 weeks. She went to the ED and had an abscess drained with no improvement. Diagnostic mammogram and US showed a 7.3cm right breast mass extending into all 4 quadrants and partially into overlying skin with 12 abnormal right axillary lymph nodes. 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  Treatment plan: 1. Neoadjuvant chemotherapy with Adriamycin and Cytoxan dose dense 4 followed byTaxolweekly 12 2. Followed by breastmastectomy and axillary lymph node dissection 3. Followed by adjuvant radiation therapy 4.Follow-up adjuvant antiestrogen therapy  CT CAP 08/31/2019: Very large central right  breast mass, bulky right axillary and subpectoral lymph nodes. No evidence of distant metastatic disease. Bulky uterine fibroids Bone scan 08/31/2019: No metastatic disease in the bones ---------------------------------------------------------------------------------------------------------------------------------------------------- Current treatment:Cycle 3dose dense Adriamycin and Cytoxan Echo: 08/29/2019: EF60-65% Chemo toxicities: 1.Nausea and vomiting: Last 2 to 3 days after the Udenyca injection. 2.Weight loss: She is trying to eat more protein. 3.Cytopenias: We reduced the dosage of cycle 2 chemotherapy. 4.  Chemotherapy-induced anemia: Monitoring closely.  Today's hemoglobin is 10.1.  Return to clinic in 2 week for cycle 4    No orders of the defined types were placed in this  encounter.  The Gina Ross has a good understanding of the overall plan. she agrees with it. she will call with any problems that may develop before the next visit here.  Total time spent: 30 mins including face to face time and time spent for planning, charting and coordination of care  [name], MD 10/02/2019  I, Cloyde Reams Dorshimer, am acting as scribe for Dr. [name].  I have reviewed the above documentation for accuracy and completeness, and I agree with the above.

## 2019-10-02 ENCOUNTER — Inpatient Hospital Stay (HOSPITAL_BASED_OUTPATIENT_CLINIC_OR_DEPARTMENT_OTHER): Payer: Medicaid Other | Admitting: Hematology and Oncology

## 2019-10-02 ENCOUNTER — Inpatient Hospital Stay: Payer: Medicaid Other

## 2019-10-02 ENCOUNTER — Inpatient Hospital Stay: Payer: Medicaid Other | Attending: Hematology and Oncology

## 2019-10-02 ENCOUNTER — Other Ambulatory Visit: Payer: Self-pay

## 2019-10-02 ENCOUNTER — Other Ambulatory Visit: Payer: Medicaid Other | Admitting: Licensed Clinical Social Worker

## 2019-10-02 DIAGNOSIS — Z95828 Presence of other vascular implants and grafts: Secondary | ICD-10-CM

## 2019-10-02 DIAGNOSIS — Z5189 Encounter for other specified aftercare: Secondary | ICD-10-CM | POA: Diagnosis not present

## 2019-10-02 DIAGNOSIS — Z5111 Encounter for antineoplastic chemotherapy: Secondary | ICD-10-CM | POA: Diagnosis present

## 2019-10-02 DIAGNOSIS — C50811 Malignant neoplasm of overlapping sites of right female breast: Secondary | ICD-10-CM

## 2019-10-02 DIAGNOSIS — Z170 Estrogen receptor positive status [ER+]: Secondary | ICD-10-CM | POA: Insufficient documentation

## 2019-10-02 LAB — CBC WITH DIFFERENTIAL (CANCER CENTER ONLY)
Abs Immature Granulocytes: 0.44 10*3/uL — ABNORMAL HIGH (ref 0.00–0.07)
Basophils Absolute: 0.1 10*3/uL (ref 0.0–0.1)
Basophils Relative: 1 %
Eosinophils Absolute: 0 10*3/uL (ref 0.0–0.5)
Eosinophils Relative: 1 %
HCT: 30 % — ABNORMAL LOW (ref 36.0–46.0)
Hemoglobin: 10.1 g/dL — ABNORMAL LOW (ref 12.0–15.0)
Immature Granulocytes: 8 %
Lymphocytes Relative: 25 %
Lymphs Abs: 1.5 10*3/uL (ref 0.7–4.0)
MCH: 30.1 pg (ref 26.0–34.0)
MCHC: 33.7 g/dL (ref 30.0–36.0)
MCV: 89.6 fL (ref 80.0–100.0)
Monocytes Absolute: 0.9 10*3/uL (ref 0.1–1.0)
Monocytes Relative: 16 %
Neutro Abs: 2.9 10*3/uL (ref 1.7–7.7)
Neutrophils Relative %: 49 %
Platelet Count: 233 10*3/uL (ref 150–400)
RBC: 3.35 MIL/uL — ABNORMAL LOW (ref 3.87–5.11)
RDW: 13.3 % (ref 11.5–15.5)
WBC Count: 5.8 10*3/uL (ref 4.0–10.5)
nRBC: 1.4 % — ABNORMAL HIGH (ref 0.0–0.2)

## 2019-10-02 LAB — CMP (CANCER CENTER ONLY)
ALT: 11 U/L (ref 0–44)
AST: 11 U/L — ABNORMAL LOW (ref 15–41)
Albumin: 3.4 g/dL — ABNORMAL LOW (ref 3.5–5.0)
Alkaline Phosphatase: 81 U/L (ref 38–126)
Anion gap: 6 (ref 5–15)
BUN: 9 mg/dL (ref 6–20)
CO2: 26 mmol/L (ref 22–32)
Calcium: 8.6 mg/dL — ABNORMAL LOW (ref 8.9–10.3)
Chloride: 107 mmol/L (ref 98–111)
Creatinine: 0.86 mg/dL (ref 0.44–1.00)
GFR, Est AFR Am: >60 mL/min (ref >60)
GFR, Estimated: >60 mL/min (ref >60)
Glucose, Bld: 94 mg/dL (ref 70–99)
Potassium: 4 mmol/L (ref 3.5–5.1)
Sodium: 139 mmol/L (ref 135–145)
Total Bilirubin: <0.2 mg/dL — ABNORMAL LOW (ref 0.3–1.2)
Total Protein: 6.8 g/dL (ref 6.5–8.1)

## 2019-10-02 MED ORDER — HEPARIN SOD (PORK) LOCK FLUSH 100 UNIT/ML IV SOLN
500.0000 [IU] | Freq: Once | INTRAVENOUS | Status: AC | PRN
  Administered 2019-10-02: 500 [IU]
  Filled 2019-10-02: qty 5

## 2019-10-02 MED ORDER — DEXAMETHASONE 4 MG PO TABS: 4.0000 mg | ORAL_TABLET | Freq: Every day | ORAL | 0 refills | Status: DC

## 2019-10-02 MED ORDER — SODIUM CHLORIDE 0.9 % IV SOLN
150.0000 mg | Freq: Once | INTRAVENOUS | Status: AC
  Administered 2019-10-02: 150 mg via INTRAVENOUS
  Filled 2019-10-02: qty 150

## 2019-10-02 MED ORDER — SODIUM CHLORIDE 0.9% FLUSH
10.0000 mL | INTRAVENOUS | Status: DC | PRN
  Administered 2019-10-02: 13:00:00 10 mL
  Filled 2019-10-02: qty 10

## 2019-10-02 MED ORDER — SODIUM CHLORIDE 0.9 % IV SOLN
Freq: Once | INTRAVENOUS | Status: AC
  Filled 2019-10-02: qty 250

## 2019-10-02 MED ORDER — PALONOSETRON HCL INJECTION 0.25 MG/5ML
INTRAVENOUS | Status: AC
  Filled 2019-10-02: qty 5

## 2019-10-02 MED ORDER — DOXORUBICIN HCL CHEMO IV INJECTION 2 MG/ML
50.0000 mg/m2 | Freq: Once | INTRAVENOUS | Status: AC
  Administered 2019-10-02: 92 mg via INTRAVENOUS
  Filled 2019-10-02: qty 46

## 2019-10-02 MED ORDER — SODIUM CHLORIDE 0.9% FLUSH
10.0000 mL | INTRAVENOUS | Status: DC | PRN
  Administered 2019-10-02: 10 mL
  Filled 2019-10-02: qty 10

## 2019-10-02 MED ORDER — SODIUM CHLORIDE 0.9 % IV SOLN
500.0000 mg/m2 | Freq: Once | INTRAVENOUS | Status: AC
  Administered 2019-10-02: 920 mg via INTRAVENOUS
  Filled 2019-10-02: qty 46

## 2019-10-02 MED ORDER — SODIUM CHLORIDE 0.9 % IV SOLN
10.0000 mg | Freq: Once | INTRAVENOUS | Status: AC
  Administered 2019-10-02: 10 mg via INTRAVENOUS
  Filled 2019-10-02: qty 10

## 2019-10-02 MED ORDER — PALONOSETRON HCL INJECTION 0.25 MG/5ML
0.2500 mg | Freq: Once | INTRAVENOUS | Status: AC
  Administered 2019-10-02: 0.25 mg via INTRAVENOUS

## 2019-10-02 NOTE — Patient Instructions (Signed)
[facility] Discharge Instructions for Patients Receiving Chemotherapy  Today you received the following chemotherapy agents: Adriamycin, Cytoxan  To help prevent nausea and vomiting after your treatment, we encourage you to take your nausea medication as directed.   If you develop nausea and vomiting that is not controlled by your nausea medication, call the clinic.   BELOW ARE SYMPTOMS THAT SHOULD BE REPORTED IMMEDIATELY:  *FEVER GREATER THAN 100.5 F  *CHILLS WITH OR WITHOUT FEVER  NAUSEA AND VOMITING THAT IS NOT CONTROLLED WITH YOUR NAUSEA MEDICATION  *UNUSUAL SHORTNESS OF BREATH  *UNUSUAL BRUISING OR BLEEDING  TENDERNESS IN MOUTH AND THROAT WITH OR WITHOUT PRESENCE OF ULCERS  *URINARY PROBLEMS  *BOWEL PROBLEMS  UNUSUAL RASH Items with * indicate a potential emergency and should be followed up as soon as possible.  Feel free to call the clinic should you have any questions or concerns. The clinic phone number is [phone].  Please show the CHEMO ALERT CARD at check-in to the Emergency Department and triage nurse.   

## 2019-10-02 NOTE — Assessment & Plan Note (Signed)
08/17/19:Patient palpated a right breast mass and right axilla mass and noted skin redness x2 weeks. She went to the ED and had an abscess drained with no improvement. Diagnostic mammogram and US showed a 7.3cm right breast mass extending into all 4 quadrants and partially into overlying skin with 12 abnormal right axillary lymph nodes. 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  Treatment plan: 1. Neoadjuvant chemotherapy with Adriamycin and Cytoxan dose dense 4 followed byTaxolweekly 12 2. Followed by breastmastectomy and axillary lymph node dissection 3. Followed by adjuvant radiation therapy 4.Follow-up adjuvant antiestrogen therapy  CT CAP 08/31/2019: Very large central right breast mass, bulky right axillary and subpectoral lymph nodes. No evidence of distant metastatic disease. Bulky uterine fibroids Bone scan 08/31/2019: No metastatic disease in the bones ---------------------------------------------------------------------------------------------------------------------------------------------------- Current treatment:Cycle 3dose dense Adriamycin and Cytoxan Echo: 08/29/2019: EF60-65% Chemo toxicities: 1.Nausea and vomiting: Will be monitored closely. 2.Weight loss: I instructed her that it would improve this week since her taste and appetite are much better. 3.Cytopenias: We reduced the dosage of cycle 2 chemotherapy.  Return to clinic in 2 week for cycle 4

## 2019-10-04 ENCOUNTER — Inpatient Hospital Stay: Payer: Medicaid Other

## 2019-10-04 ENCOUNTER — Inpatient Hospital Stay: Admission: RE | Admit: 2019-10-04 | Payer: Medicaid Other | Source: Ambulatory Visit

## 2019-10-04 ENCOUNTER — Other Ambulatory Visit: Payer: Self-pay

## 2019-10-04 VITALS — BP 131/89 | HR 79 | Temp 98.5°F | Resp 18

## 2019-10-04 DIAGNOSIS — Z170 Estrogen receptor positive status [ER+]: Secondary | ICD-10-CM

## 2019-10-04 DIAGNOSIS — Z5111 Encounter for antineoplastic chemotherapy: Secondary | ICD-10-CM | POA: Diagnosis not present

## 2019-10-04 DIAGNOSIS — C50811 Malignant neoplasm of overlapping sites of right female breast: Secondary | ICD-10-CM

## 2019-10-04 MED ORDER — PEGFILGRASTIM-CBQV 6 MG/0.6ML ~~LOC~~ SOSY
PREFILLED_SYRINGE | SUBCUTANEOUS | Status: AC
  Filled 2019-10-04: qty 0.6

## 2019-10-04 MED ORDER — PEGFILGRASTIM-CBQV 6 MG/0.6ML ~~LOC~~ SOSY
6.0000 mg | PREFILLED_SYRINGE | Freq: Once | SUBCUTANEOUS | Status: AC
  Administered 2019-10-04: 6 mg via SUBCUTANEOUS

## 2019-10-04 NOTE — Patient Instructions (Signed)

## 2019-10-10 NOTE — Progress Notes (Signed)
Pharmacist Chemotherapy Monitoring - Follow Up Assessment    I verify that I have reviewed each item in the below checklist:  . Regimen for the patient is scheduled for the appropriate day and plan matches scheduled date. Marland Kitchen Appropriate non-routine labs are ordered dependent on drug ordered. . If applicable, additional medications reviewed and ordered per protocol based on lifetime cumulative doses and/or treatment regimen.   Plan for follow-up and/or issues identified: No . I-vent associated with next due treatment: No . MD and/or nursing notified: No  Gina Ross K 10/10/2019 9:17 AM

## 2019-10-11 ENCOUNTER — Telehealth: Payer: Self-pay | Admitting: Genetic Counselor

## 2019-10-11 NOTE — Telephone Encounter (Signed)
Called to follow-up with Gina Ross regarding her genetic test results. She feels that she is now able to discuss these results and is agreeable to setting up a genetic counsleing appointment. We briefly reviewed the main cancer risks and management recommendations for women who carry a BRCA1 mutation, including the risk to develop a second breast cancer and ovarian cancer.   We will plan to discuss the BRCA1 gene in more detail during a virtual genetic counseling appointment next Tuesday (10/16/19) at 3:00pm. We will text her with a link to join the visit at the time of her appointment.

## 2019-10-15 NOTE — Progress Notes (Signed)
Patient Care Team: Patient, [name] as PCP - General (General Practice) [name], RN as Oncology Nurse Navigator [name], RN as Oncology Nurse Navigator [name], MD as Consulting Physician (General Surgery) [name], MD as Consulting Physician (Hematology and Oncology) [name], MD as Consulting Physician (Radiation Oncology)  DIAGNOSIS:    ICD-10-CM   1. Malignant neoplasm of overlapping sites of right breast in female, estrogen receptor positive ([place])  C50.811    Z17.0     SUMMARY OF ONCOLOGIC HISTORY: Oncology History  Malignant neoplasm of overlapping sites of right breast in female, estrogen receptor positive ([place])  08/17/2019 Initial Diagnosis   Patient palpated a right breast mass and right axilla mass and noted skin redness x2 weeks. She went to the ED and had an abscess drained with no improvement. Diagnostic mammogram and US showed a 7.3cm right breast mass extending into all 4 quadrants and partially into overlying skin with 12 abnormal right axillary lymph nodes. Biopsy  showed IDC with necrosis in the breast and axilla, grade 3, HER-2 equivocal by IHC, negative by FISH, ER+ 40% weak, PR -, Ki67 90%.    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 The report date is 09/03/2019.  The Common Hereditary Cancers Panel offered by [facility] includes sequencing and/or deletion duplication testing of the following 48 genes: APC, ATM, AXIN2, BARD1, BMPR1A, BRCA1, BRCA2, BRIP1, CDH1, CDK4, CDKN2A (p14ARF), CDKN2A (p16INK4a), CHEK2, CTNNA1, DICER1, EPCAM (Deletion/duplication testing only), GREM1 (promoter region deletion/duplication testing only), KIT, MEN1, MLH1, MSH2, MSH3, MSH6, MUTYH, NBN, NF1, NHTL1, PALB2, PDGFRA, PMS2, POLD1, POLE, PTEN, RAD50, RAD51C, RAD51D, RNF43, SDHB, SDHC, SDHD, SMAD4, SMARCA4. STK11, TP53,  TSC1, TSC2, and VHL.  The following genes were evaluated for sequence changes only: SDHA and HOXB13 c.251G>A variant only.    09/04/2019 -  Chemotherapy   The patient had DOXOrubicin (ADRIAMYCIN) chemo injection 110 mg, 60 mg/m2 = 110 mg, Intravenous,  Once, 3 of 4 cycles Dose modification: 50 mg/m2 (original dose 60 mg/m2, Cycle 2, Reason: Provider Judgment) Administration: 110 mg (09/04/2019), 92 mg (09/18/2019), 92 mg (10/02/2019) palonosetron (ALOXI) injection 0.25 mg, 0.25 mg, Intravenous,  Once, 3 of 4 cycles Administration: 0.25 mg (09/04/2019), 0.25 mg (09/18/2019), 0.25 mg (10/02/2019) pegfilgrastim-cbqv (UDENYCA) injection 6 mg, 6 mg, Subcutaneous, Once, 3 of 4 cycles Administration: 6 mg (09/06/2019), 6 mg (09/20/2019), 6 mg (10/04/2019) cyclophosphamide (CYTOXAN) 1,100 mg in sodium chloride 0.9 % 250 mL chemo infusion, 600 mg/m2 = 1,100 mg, Intravenous,  Once, 3 of 4 cycles Dose modification: 500 mg/m2 (original dose 600 mg/m2, Cycle 2, Reason: Provider Judgment) Administration: 1,100 mg (09/04/2019), 920 mg (09/18/2019), 920 mg (10/02/2019) PACLitaxel (TAXOL) 144 mg in sodium chloride 0.9 % 250 mL chemo infusion (</= 96m/m2), 80 mg/m2 = 144 mg, Intravenous,  Once, 0 of 12 cycles fosaprepitant (EMEND) 150 mg in sodium chloride 0.9 % 145 mL IVPB, 150 mg, Intravenous,  Once, 3 of 4 cycles Administration: 150 mg (09/04/2019), 150 mg (09/18/2019), 150 mg (10/02/2019)  for chemotherapy treatment.      CHIEF COMPLIANT: Cycle4Adriamycin and Cytoxan  INTERVAL HISTORY: UJACQUELINNE SPEAKis a 49y.o. with above-mentioned history of right breast cancer currently on neoadjuvant chemotherapy with dose dense Adriamycin and Cytoxan. She presents to the clinic todayfora toxicity check and cycle 4.    ALLERGIES:  is allergic to penicillins.  MEDICATIONS:  Current Outpatient  Medications  Medication Sig Dispense Refill  . acetaminophen (TYLENOL) 325 MG tablet Take 650 mg by mouth every 6 (six) hours as needed.      Marland Kitchen dexamethasone (DECADRON) 4 MG tablet Take 1 tablet (4 mg total) by mouth daily. 2 tablet 0  . ibuprofen (ADVIL) 800 MG tablet Take 1 tablet (800 mg total) by mouth 3 (three) times daily. 21 tablet 0  . lidocaine-prilocaine (EMLA) cream Apply to affected area once 30 g 3  . LORazepam (ATIVAN) 0.5 MG tablet Take 1 tablet (0.5 mg total) by mouth at bedtime as needed (Nausea or vomiting). 30 tablet 0  . traMADol (ULTRAM) 50 MG tablet Take 1 tablet (50 mg total) by mouth every 6 (six) hours as needed. 12 tablet 0  . ondansetron (ZOFRAN) 8 MG tablet Take 1 tablet (8 mg total) by mouth 2 (two) times daily as needed. Start on the third day after chemotherapy. (Patient not taking: Reported on 10/16/2019) 30 tablet 1  . prochlorperazine (COMPAZINE) 10 MG tablet Take 1 tablet (10 mg total) by mouth every 6 (six) hours as needed (Nausea or vomiting). (Patient not taking: Reported on 10/16/2019) 30 tablet 1   No current facility-administered medications for this visit.    PHYSICAL EXAMINATION: ECOG PERFORMANCE STATUS: 1 - Symptomatic but completely ambulatory  Vitals:   10/16/19 1125  BP: (!) 150/93  Pulse: 84  Resp: 18  SpO2: 100%   Filed Weights   10/16/19 1125  Weight: 145 lb 14.4 oz (66.2 kg)    LABORATORY DATA:  I have reviewed the data as listed CMP Latest Ref Rng & Units 10/02/2019 09/18/2019 09/11/2019  Glucose 70 - 99 mg/dL 94 102(H) 113(H)  BUN 6 - 20 mg/dL 9 4(L) 5(L)  Creatinine 0.44 - 1.00 mg/dL 0.86 0.69 0.68  Sodium 135 - 145 mmol/L 139 140 136  Potassium 3.5 - 5.1 mmol/L 4.0 3.4(L) 3.5  Chloride 98 - 111 mmol/L 107 105 103  CO2 22 - 32 mmol/L 26 24 25   Calcium 8.9 - 10.3 mg/dL 8.6(L) 8.9 8.4(L)  Total Protein 6.5 - 8.1 g/dL 6.8 7.1 6.9  Total Bilirubin 0.3 - 1.2 mg/dL <0.2(L) <0.2(L) 0.4  Alkaline Phos 38 - 126 U/L 81 77 63  AST 15 - 41 U/L 11(L) 13(L) 10(L)  ALT 0 - 44 U/L 11 11 6     Lab Results  Component Value Date   WBC 9.8 10/16/2019   HGB 10.5 (L) 10/16/2019    HCT 31.0 (L) 10/16/2019   MCV 91.2 10/16/2019   PLT 248 10/16/2019   NEUTROABS PENDING 10/16/2019    ASSESSMENT & PLAN:  Malignant neoplasm of overlapping sites of right breast in female, estrogen receptor positive ([place]) 08/17/19:Patient palpated a right breast mass and right axilla mass and noted skin redness x2 weeks. She went to the ED and had an abscess drained with no improvement. Diagnostic mammogram and US showed a 7.3cm right breast mass extending into all 4 quadrants and partially into overlying skin with 12 abnormal right axillary lymph nodes. 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  Treatment plan: 1. Neoadjuvant chemotherapy with Adriamycin and Cytoxan dose dense 4 followed byTaxolweekly 12 2. Followed by breastmastectomy and axillary lymph node dissection 3. Followed by adjuvant radiation therapy 4.Follow-up adjuvant antiestrogen therapy  CT CAP 08/31/2019: Very large central right breast mass, bulky right axillary and subpectoral lymph nodes. No evidence  of distant metastatic disease. Bulky uterine fibroids Bone scan 08/31/2019: No metastatic disease in the bones ---------------------------------------------------------------------------------------------------------------------------------------------------- Current treatment:Cycle4dose dense Adriamycin and Cytoxan Echo: 08/29/2019: EF60-65% Chemo toxicities: 1.Nausea and vomiting: Last for several days 2.Weight loss:  Monitoring closely. 3.Cytopenias:We reduced thedosage of cycle 2 chemotherapy. 4.  Chemotherapy-induced anemia: Monitoring closely.  Today's hemoglobin is 10.5.  Patient is feeling anxious and tired of going to chemotherapy.  I reassured her and give her counseling and support that she is doing really well and that she ought to stay on the course and finish her treatments.  Return to clinic  in2week for cycle 1 Taxol    No orders of the defined types were placed in this encounter.  The patient has a good understanding of the overall plan. she agrees with it. she will call with any problems that may develop before the next visit here.  Total time spent: 30 mins including face to face time and time spent for planning, charting and coordination of care  [name], MD 10/16/2019  I, Cloyde Reams Dorshimer, am acting as scribe for Dr. [name].  I have reviewed the above documentation for accuracy and completeness, and I agree with the above.

## 2019-10-16 ENCOUNTER — Inpatient Hospital Stay (HOSPITAL_BASED_OUTPATIENT_CLINIC_OR_DEPARTMENT_OTHER): Payer: Medicaid Other | Admitting: Hematology and Oncology

## 2019-10-16 ENCOUNTER — Encounter: Payer: Self-pay | Admitting: *Deleted

## 2019-10-16 ENCOUNTER — Inpatient Hospital Stay: Payer: Medicaid Other | Admitting: Licensed Clinical Social Worker

## 2019-10-16 ENCOUNTER — Other Ambulatory Visit: Payer: Self-pay

## 2019-10-16 ENCOUNTER — Inpatient Hospital Stay: Payer: Medicaid Other

## 2019-10-16 ENCOUNTER — Inpatient Hospital Stay: Payer: Medicaid Other | Admitting: Genetic Counselor

## 2019-10-16 ENCOUNTER — Encounter: Payer: Self-pay | Admitting: Hematology and Oncology

## 2019-10-16 VITALS — BP 150/103

## 2019-10-16 DIAGNOSIS — Z170 Estrogen receptor positive status [ER+]: Secondary | ICD-10-CM

## 2019-10-16 DIAGNOSIS — C50811 Malignant neoplasm of overlapping sites of right female breast: Secondary | ICD-10-CM

## 2019-10-16 DIAGNOSIS — Z5111 Encounter for antineoplastic chemotherapy: Secondary | ICD-10-CM | POA: Diagnosis not present

## 2019-10-16 DIAGNOSIS — Z95828 Presence of other vascular implants and grafts: Secondary | ICD-10-CM

## 2019-10-16 LAB — CMP (CANCER CENTER ONLY)
ALT: 11 U/L (ref 0–44)
AST: 13 U/L — ABNORMAL LOW (ref 15–41)
Albumin: 3.4 g/dL — ABNORMAL LOW (ref 3.5–5.0)
Alkaline Phosphatase: 90 U/L (ref 38–126)
Anion gap: 8 (ref 5–15)
BUN: 6 mg/dL (ref 6–20)
CO2: 24 mmol/L (ref 22–32)
Calcium: 8.7 mg/dL — ABNORMAL LOW (ref 8.9–10.3)
Chloride: 106 mmol/L (ref 98–111)
Creatinine: 0.79 mg/dL (ref 0.44–1.00)
GFR, Est AFR Am: >60 mL/min (ref >60)
GFR, Estimated: >60 mL/min (ref >60)
Glucose, Bld: 96 mg/dL (ref 70–99)
Potassium: 4.2 mmol/L (ref 3.5–5.1)
Sodium: 138 mmol/L (ref 135–145)
Total Bilirubin: <0.2 mg/dL — ABNORMAL LOW (ref 0.3–1.2)
Total Protein: 6.6 g/dL (ref 6.5–8.1)

## 2019-10-16 LAB — CBC WITH DIFFERENTIAL (CANCER CENTER ONLY)
Abs Immature Granulocytes: 0.78 10*3/uL — ABNORMAL HIGH (ref 0.00–0.07)
Basophils Absolute: 0.1 10*3/uL (ref 0.0–0.1)
Basophils Relative: 1 %
Eosinophils Absolute: 0.2 10*3/uL (ref 0.0–0.5)
Eosinophils Relative: 2 %
HCT: 31 % — ABNORMAL LOW (ref 36.0–46.0)
Hemoglobin: 10.5 g/dL — ABNORMAL LOW (ref 12.0–15.0)
Immature Granulocytes: 8 %
Lymphocytes Relative: 13 %
Lymphs Abs: 1.2 10*3/uL (ref 0.7–4.0)
MCH: 30.9 pg (ref 26.0–34.0)
MCHC: 33.9 g/dL (ref 30.0–36.0)
MCV: 91.2 fL (ref 80.0–100.0)
Monocytes Absolute: 0.9 10*3/uL (ref 0.1–1.0)
Monocytes Relative: 9 %
Neutro Abs: 6.6 10*3/uL (ref 1.7–7.7)
Neutrophils Relative %: 67 %
Platelet Count: 248 10*3/uL (ref 150–400)
RBC: 3.4 MIL/uL — ABNORMAL LOW (ref 3.87–5.11)
RDW: 14.6 % (ref 11.5–15.5)
WBC Count: 9.8 10*3/uL (ref 4.0–10.5)
nRBC: 0.4 % — ABNORMAL HIGH (ref 0.0–0.2)

## 2019-10-16 MED ORDER — LORAZEPAM 0.5 MG PO TABS: 0.5000 mg | ORAL_TABLET | Freq: Every evening | ORAL | 1 refills | Status: DC | PRN

## 2019-10-16 MED ORDER — SODIUM CHLORIDE 0.9 % IV SOLN
500.0000 mg/m2 | Freq: Once | INTRAVENOUS | Status: AC
  Administered 2019-10-16: 15:00:00 920 mg via INTRAVENOUS
  Filled 2019-10-16: qty 46

## 2019-10-16 MED ORDER — DOXORUBICIN HCL CHEMO IV INJECTION 2 MG/ML
50.0000 mg/m2 | Freq: Once | INTRAVENOUS | Status: AC
  Administered 2019-10-16: 92 mg via INTRAVENOUS
  Filled 2019-10-16: qty 46

## 2019-10-16 MED ORDER — PALONOSETRON HCL INJECTION 0.25 MG/5ML
0.2500 mg | Freq: Once | INTRAVENOUS | Status: AC
  Administered 2019-10-16: 13:00:00 0.25 mg via INTRAVENOUS

## 2019-10-16 MED ORDER — PALONOSETRON HCL INJECTION 0.25 MG/5ML
INTRAVENOUS | Status: AC
  Filled 2019-10-16: qty 5

## 2019-10-16 MED ORDER — SODIUM CHLORIDE 0.9% FLUSH
10.0000 mL | INTRAVENOUS | Status: DC | PRN
  Administered 2019-10-16: 11:00:00 10 mL
  Filled 2019-10-16: qty 10

## 2019-10-16 MED ORDER — SODIUM CHLORIDE 0.9% FLUSH
10.0000 mL | INTRAVENOUS | Status: DC | PRN
  Administered 2019-10-16: 10 mL
  Filled 2019-10-16: qty 10

## 2019-10-16 MED ORDER — SODIUM CHLORIDE 0.9 % IV SOLN
Freq: Once | INTRAVENOUS | Status: AC
  Filled 2019-10-16: qty 250

## 2019-10-16 MED ORDER — SODIUM CHLORIDE 0.9 % IV SOLN
10.0000 mg | Freq: Once | INTRAVENOUS | Status: AC
  Administered 2019-10-16: 13:00:00 10 mg via INTRAVENOUS
  Filled 2019-10-16: qty 10

## 2019-10-16 MED ORDER — SODIUM CHLORIDE 0.9 % IV SOLN
150.0000 mg | Freq: Once | INTRAVENOUS | Status: AC
  Administered 2019-10-16: 13:00:00 150 mg via INTRAVENOUS
  Filled 2019-10-16: qty 150

## 2019-10-16 MED ORDER — HEPARIN SOD (PORK) LOCK FLUSH 100 UNIT/ML IV SOLN
500.0000 [IU] | Freq: Once | INTRAVENOUS | Status: AC | PRN
  Administered 2019-10-16: 500 [IU]
  Filled 2019-10-16: qty 5

## 2019-10-16 NOTE — Progress Notes (Signed)
[place] CSW Progress Note  Holiday representative met with patient to follow-up on resource needs. Gave second disbursement of [employer]. Patient also received funds from [employer]. No other identified needs today.  Patient is doing "okay" overall, but has been getting "knocked out" by the injection after chemo. She will start weekly infusions in 2 weeks.  CSW will continue to check-in periodically regarding resource and emotional support.   Gina Ross E Jajuan Skoog LCSW, LCSW

## 2019-10-16 NOTE — Assessment & Plan Note (Signed)
08/17/19:Patient palpated a right breast mass and right axilla mass and noted skin redness x2 weeks. She went to the ED and had an abscess drained with no improvement. Diagnostic mammogram and US showed a 7.3cm right breast mass extending into all 4 quadrants and partially into overlying skin with 12 abnormal right axillary lymph nodes. 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  Treatment plan: 1. Neoadjuvant chemotherapy with Adriamycin and Cytoxan dose dense 4 followed byTaxolweekly 12 2. Followed by breastmastectomy and axillary lymph node dissection 3. Followed by adjuvant radiation therapy 4.Follow-up adjuvant antiestrogen therapy  CT CAP 08/31/2019: Very large central right breast mass, bulky right axillary and subpectoral lymph nodes. No evidence of distant metastatic disease. Bulky uterine fibroids Bone scan 08/31/2019: No metastatic disease in the bones ---------------------------------------------------------------------------------------------------------------------------------------------------- Current treatment:Cycle4dose dense Adriamycin and Cytoxan Echo: 08/29/2019: EF60-65% Chemo toxicities: 1.Nausea and vomiting: Last for several days 2.Weight loss:  Monitoring closely. 3.Cytopenias:We reduced thedosage of cycle 2 chemotherapy. 4.  Chemotherapy-induced anemia: Monitoring closely.  Today's hemoglobin is    Return to clinic in2week for cycle  Taxol

## 2019-10-16 NOTE — Patient Instructions (Signed)
[facility] Discharge Instructions for Patients Receiving Chemotherapy  Today you received the following chemotherapy agents: Doxorubicin and Cytoxan  To help prevent nausea and vomiting after your treatment, we encourage you to take your nausea medication as directed by your MD.   If you develop nausea and vomiting that is not controlled by your nausea medication, call the clinic.   BELOW ARE SYMPTOMS THAT SHOULD BE REPORTED IMMEDIATELY:  *FEVER GREATER THAN 100.5 F  *CHILLS WITH OR WITHOUT FEVER  NAUSEA AND VOMITING THAT IS NOT CONTROLLED WITH YOUR NAUSEA MEDICATION  *UNUSUAL SHORTNESS OF BREATH  *UNUSUAL BRUISING OR BLEEDING  TENDERNESS IN MOUTH AND THROAT WITH OR WITHOUT PRESENCE OF ULCERS  *URINARY PROBLEMS  *BOWEL PROBLEMS  UNUSUAL RASH Items with * indicate a potential emergency and should be followed up as soon as possible.  Feel free to call the clinic should you have any questions or concerns. The clinic phone number is [phone].  Please show the [facility] at check-in to the Emergency Department and triage nurse. Coronavirus (COVID-19) Are you at risk?  Are you at risk for the Coronavirus (COVID-19)?  To be considered HIGH RISK for Coronavirus (COVID-19), you have to meet the following criteria:  . Traveled to [place], [place], [place], [place] or [place]; or in the [place] to [place], [place], [place], or [place]; and have fever, cough, and shortness of breath within the last 2 weeks of travel OR . Been in close contact with a person diagnosed with COVID-19 within the last 2 weeks and have fever, cough, and shortness of breath . IF YOU DO NOT MEET THESE CRITERIA, YOU ARE CONSIDERED LOW RISK FOR COVID-19.  What to do if you are HIGH RISK for COVID-19?  Marland Kitchen If you are having a medical emergency, call 911. . Seek medical care right away. Before you go to a doctor's office, urgent care or emergency department, call ahead and  tell them about your recent travel, contact with someone diagnosed with COVID-19, and your symptoms. You should receive instructions from your physician's office regarding next steps of care.  . When you arrive at healthcare provider, tell the healthcare staff immediately you have returned from visiting [place], [place], [place], [place] or [place]; or traveled in the [place] to [place], [place], [place], or [place]; in the last two weeks or you have been in close contact with a person diagnosed with COVID-19 in the last 2 weeks.   . Tell the health care staff about your symptoms: fever, cough and shortness of breath. . After you have been seen by a medical provider, you will be either: o Tested for (COVID-19) and discharged home on quarantine except to seek medical care if symptoms worsen, and asked to  - Stay home and avoid contact with others until you get your results (4-5 days)  - Avoid travel on public transportation if possible (such as bus, train, or airplane) or o Sent to the Emergency Department by EMS for evaluation, COVID-19 testing, and possible admission depending on your condition and test results.  What to do if you are LOW RISK for COVID-19?  Reduce your risk of any infection by using the same precautions used for avoiding the common cold or flu:  Marland Kitchen Wash your hands often with soap and warm water for at least 20 seconds.  If soap and water are not readily available, use an alcohol-based hand sanitizer with at least 60% alcohol.  Marland Kitchen  If coughing or sneezing, cover your mouth and nose by coughing or sneezing into the elbow areas of your shirt or coat, into a tissue or into your sleeve (not your hands). . Avoid shaking hands with others and consider head nods or verbal greetings only. . Avoid touching your eyes, nose, or mouth with unwashed hands.  . Avoid close contact with people who are sick. . Avoid places or events with large numbers of people in one location, like  concerts or sporting events. . Carefully consider travel plans you have or are making. . If you are planning any travel outside or inside the [place], visit the CDC's Travelers' Health webpage for the latest health notices. . If you have some symptoms but not all symptoms, continue to monitor at home and seek medical attention if your symptoms worsen. . If you are having a medical emergency, call 911.   ADDITIONAL HEALTHCARE OPTIONS FOR PATIENTS  [facility] / e-Visit: [email or web address]         [facility]: [phone]  [facility]: [phone]                   [facility]: [phone]  

## 2019-10-16 NOTE — Progress Notes (Signed)
Nutrition Follow-up:  Patient with right breast cancer.  Receiving neoadjuvant chemotherapy.   Met with patient during infusion.  Patient reports that she has a good appetite except for 2-3 days after receiving Udenyca. Reports that she "eats all the time, all different types of foods."    Medications: reviewed  Labs: reviewed  Anthropometrics:   Weight 145 lb 14.4 oz today increased from 142 lb 8 oz on 3/23   NUTRITION DIAGNOSIS:  Inadequate oral intake improved   INTERVENTION:  Discussed importance of continuing well-balanced nutrition including good sources of protein.   Discussed oral nutrition supplements Patient has contact information     MONITORING, EVALUATION, GOAL: Patient will consume adequate calories and protein to prevent weight loss during treatment.    NEXT VISIT: as needed  Sarely Stracener B. Zenia Resides, [place], [place] Registered Dietitian [phone] (pager)

## 2019-10-16 NOTE — Progress Notes (Signed)
Per Dr. Lindi Adie ok for treatment today with BP 150/103. Pt. denies chest pain, dizziness, and no shortness of breath noted.

## 2019-10-18 ENCOUNTER — Inpatient Hospital Stay: Payer: Medicaid Other

## 2019-10-18 ENCOUNTER — Other Ambulatory Visit: Payer: Self-pay

## 2019-10-18 VITALS — BP 148/88 | HR 78 | Temp 98.2°F | Resp 18

## 2019-10-18 DIAGNOSIS — Z5111 Encounter for antineoplastic chemotherapy: Secondary | ICD-10-CM | POA: Diagnosis not present

## 2019-10-18 DIAGNOSIS — C50811 Malignant neoplasm of overlapping sites of right female breast: Secondary | ICD-10-CM

## 2019-10-18 DIAGNOSIS — Z170 Estrogen receptor positive status [ER+]: Secondary | ICD-10-CM

## 2019-10-18 MED ORDER — PEGFILGRASTIM-CBQV 6 MG/0.6ML ~~LOC~~ SOSY
6.0000 mg | PREFILLED_SYRINGE | Freq: Once | SUBCUTANEOUS | Status: AC
  Administered 2019-10-18: 6 mg via SUBCUTANEOUS

## 2019-10-18 MED ORDER — PEGFILGRASTIM-CBQV 6 MG/0.6ML ~~LOC~~ SOSY
PREFILLED_SYRINGE | SUBCUTANEOUS | Status: AC
  Filled 2019-10-18: qty 0.6

## 2019-10-18 NOTE — Patient Instructions (Signed)

## 2019-10-22 ENCOUNTER — Encounter: Payer: Self-pay | Admitting: *Deleted

## 2019-10-23 ENCOUNTER — Telehealth: Payer: Self-pay | Admitting: Hematology and Oncology

## 2019-10-23 NOTE — Telephone Encounter (Signed)
Scheduled per 04/20 los, patient has been called and notified.

## 2019-10-24 NOTE — Progress Notes (Signed)

## 2019-10-25 ENCOUNTER — Inpatient Hospital Stay: Payer: Medicaid Other | Admitting: Genetic Counselor

## 2019-10-25 ENCOUNTER — Telehealth: Payer: Self-pay | Admitting: Genetic Counselor

## 2019-10-25 ENCOUNTER — Telehealth: Payer: Medicaid Other | Admitting: Genetic Counselor

## 2019-10-25 NOTE — Telephone Encounter (Signed)
Called Gina Ross to see if she is able to join the video visit to discuss her genetic test results. She is having problems with her phone and cannot join the virtual visit and requested that we try to discuss the results via telephone. However, the audio connection was bad while talking. We made a plan discuss her results another time - we will call her back on Monday.

## 2019-10-29 ENCOUNTER — Ambulatory Visit: Payer: Self-pay | Admitting: Genetic Counselor

## 2019-10-29 ENCOUNTER — Telehealth: Payer: Self-pay | Admitting: Genetic Counselor

## 2019-10-29 DIAGNOSIS — Z1501 Genetic susceptibility to malignant neoplasm of breast: Secondary | ICD-10-CM

## 2019-10-29 DIAGNOSIS — Z1379 Encounter for other screening for genetic and chromosomal anomalies: Secondary | ICD-10-CM

## 2019-10-29 NOTE — Progress Notes (Signed)
GENETIC TEST RESULTS   Patient Name: Gina Ross Patient Age: 49 y.o. Encounter Date: 10/29/2019  Referring Provider: [name], MD [place],  [place] [id number]   Gina Ross was seen in the [facility] on 08/22/2019 due to a personal and family history of cancer and concern regarding a hereditary predisposition to cancer in the family. Please refer to the prior Genetics clinic note for more information regarding Gina Ross's medical and family histories and our assessment at the time.   FAMILY HISTORY:  We obtained a detailed, 4-generation family history.  Significant diagnoses are listed below: Family History  Problem Relation Age of Onset  . Hypertension Mother   . Diabetes Mother   . Breast cancer Mother        dx. 26s  . Hypertension Father   . Colon cancer Paternal Aunt        dx. 51s  . Breast cancer Maternal Grandmother        bilateral  . Cervical cancer Maternal Grandmother        tumor on cervix  . Prostate cancer Maternal Uncle        dx. 66s  . Breast cancer Cousin        dx. late 44s  . Breast cancer Cousin        dx. <50   Gina Ross has one son who is 36, and one sister, neither of whom have had cancer.   Gina Ross mother is currently living at age 68, and has a history of breast cancer diagnosed in her 50s. Gina Ross has three maternal uncles and one maternal aunt. One of her uncles had prostate cancer diagnosed in his 32s. She has a maternal cousin who had breast cancer diagnosed in her late 66s. Gina Ross maternal grandmother had bilateral breast cancer diagnosed at an unknown age, and another cancer (tumor on her cervix) in her 46s. There are no other known diagnoses of cancer on the maternal side of the family.  Gina Ross father is currently living at the age of 41 and has not had cancer. She has two paternal aunts. One of her aunts is deceased and had colon cancer in her 66s. She also has a  paternal cousin who had breast cancer diagnosed younger than 50 years old. Her paternal grandmother died at age 2 and she is unsure how old her grandfather was when he died. There are no other known diagnoses of cancer on the paternal side of the family.  Gina Ross is aware of genetic testing for hereditary cancer risks in her sister, although she does not know the results.  GENETIC TESTING:  Genetic testing reported on 09/03/2019 through the Common Hereditary Cancers Panel offered by [facility]. 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   The Common Hereditary Cancers Panel offered by [facility] includes sequencing and/or deletion duplication testing of the following 48 genes: APC, ATM, AXIN2, BARD1, BMPR1A, BRCA1, BRCA2, BRIP1, CDH1, CDK4, CDKN2A (p14ARF), CDKN2A (p16INK4a), CHEK2, CTNNA1, DICER1, EPCAM (Deletion/duplication testing only), GREM1 (promoter region deletion/duplication testing only), KIT, MEN1, MLH1, MSH2, MSH3, MSH6, MUTYH, NBN, NF1, NHTL1, PALB2, PDGFRA, PMS2, POLD1, POLE, PTEN, RAD50, RAD51C, RAD51D, RNF43, SDHB, SDHC, SDHD, SMAD4, SMARCA4. STK11, TP53, TSC1, TSC2, and VHL.  The following genes were evaluated for sequence changes only: SDHA and HOXB13 c.251G>A variant only.     CANCER RISKS: Studies show that women with a BRCA1 mutation can have a 40-87%  lifetime risk to develop breast cancer, a 40-60% lifetime risk to develop a secondary breast cancer, and up to a 36-53% risk to develop ovarian cancer. Men can have a 1-2% lifetime risk to develop female breast cancer and an increased risk for prostate cancer. Both men and women can also have a 2-3% increased risk for pancreatic cancer.  CANCER RISK REDUCTION & SCREENING RECOMMENDATIONS: The [place] (NCCN) recommends the following for women who carry BRCA mutations:  Breast awareness starting at the age of 91 years; women should report  any changes to their breasts to their health care provider. Periodic breast self-exams may facilitate breast self-awareness.  Clinical breast exams every 6-12 months, starting at age 27 years  Annual breast MRI and annual mammograms starting at age 79 years and 30 years, respectively, or individualized based on age of earliest onset of breast cancer in the family.  Consideration of risk reducing mastectomy, which reduces the risk of breast cancer by greater than 90%.  Consideration of risk reducing salpingo-oophorectomy (RRSO), ideally between the ages of 110-40, or individualized based on completion of child-bearing years or age of earliest onset of ovarian cancer in the family. RRSO reduces the risk of ovarian cancer by greater than 90% and can reduce the risk of breast cancer by 50% if performed prior to menopause. Women who have undergone risk reducing RRSO have a small risk of peritoneal carcinoma and can consider annual CA-125 surveillance.  For women who still have their ovaries, transvaginal ultrasound and CA-125 testing every 6 months starting at age 10 years, or 5-10 years prior to the earliest age of onset of ovarian cancer in the family can be considered. However, studies have not demonstrated that ovarian cancer screening is effective in detecting early ovarian cancer.  Consideration of chemoprevention options such as oral contraceptives, Tamoxifen and Raloxifene, if appropriate for an individual.  Consideration of pancreatic cancer screening if there is a family history of pancreatic cancer  As discussed with Gina Ross, to reduce the risk for breast cancer, prophylactic bilateral mastectomy is the most effective option for risk reduction. However, for women who choose to keep their breasts intensified screening is equally safe.  Gina Ross will follow up with Dr. Lindi Adie in regards to her risk to develop a second breast cancer.   To reduce the risk for ovarian cancer, we recommend  Gina Ross have a prophylactic bilateral salpingo-oophorectomy when childbearing is completed, if planned. We discussed that screening with CA-125 blood tests and transvaginal ultrasounds can be done twice per year. However, these tests have not been shown to detect ovarian cancer at an early stage.  Ms. Narvaiz does not have a GYN MD.  She will plan to follow up with Dr. Lindi Adie in regards to her risk to develop ovarian cancer.  There are several things that can be offered to individuals who are carriers for BRCA mutations that will reduce the risk for getting cancer. The use of oral contraceptives can lower the risk for ovarian cancer, and, per case control studies, does not significantly increase the risk for breast cancer in BRCA patients.  Case control studies have shown that oral contraceptives can lower the risk for ovarian cancer in women with BRCA mutations. Additionally, a more recent meta-analysis, including one corhort (n=3,181) and one case control study (1,096 cases and 2,878 controls) also showed an inverse correlation between ovarian cancer and ever having used oral contraceptives (OR, 0.58; 95% CI = 0.46-0.73).  Studies on oral contraceptives and  breast cancer have been conflicting, with some studies suggesting that there is not an increased risk for breast cancer in BRCA mutation carriers, while others suggest that there could be a risk.  That said, two meta-analysis studies have shown that there is not an increased risk for breast cancer with oral contraceptive use in BRCA1 and BRCA2 carriers.    In individuals who have a prophylactic bilateral salpino-oophorectomy (BSO), the risk for breast cancer is reduced by up to 50%.  It has been reported that short term hormone replacement therapy in women undergoing prophylactic BSO does not negate the reduction of breast cancer risk associated with surgery.  FAMILY MEMBERS: It is important that all of Ms. Willbanks's relatives (both men and women)  know of the presence of this gene mutation. Site-specific genetic testing can sort out who in the family is at risk and who is not.   Ms. Holts children and siblings have a 50% chance to have inherited this mutation. We recommend they have genetic testing for this same mutation, as identifying the presence of this mutation would allow them to also take advantage of risk-reducing measures.   SUPPORT AND RESOURCES: If Ms. Arruda is interested in BRCA-specific information and support, there are two groups, Facing Our Risk (www.facingourrisk.com) and Bright Pink (www.brightpink.org) which some people have found useful. They provide opportunities to speak with other individuals from high-risk families. To locate genetic counselors in other cities, visit the website of the [employer] (ArtistMovie.se) and Secretary/administrator for a Social worker by zip code.  We encouraged Ms. Files to remain in contact with [place] on an annual basis so we can update her personal and family histories, and let her know of advances in cancer genetics that may benefit the family. Our contact number was provided. Ms. Skidmore questions were answered to her satisfaction today, and she knows she is welcome to call anytime with additional questions.   Clint Guy, [place], [facility] Licensed, Certified Dispensing [email or web address] phone: [phone]

## 2019-10-29 NOTE — Telephone Encounter (Signed)
Discussed genetic test results showing a BRCA1 mutation in greater detail with Ms. Barkett.

## 2019-10-30 ENCOUNTER — Inpatient Hospital Stay: Payer: Medicaid Other

## 2019-10-30 ENCOUNTER — Inpatient Hospital Stay: Payer: Medicaid Other | Attending: Hematology and Oncology

## 2019-10-30 ENCOUNTER — Inpatient Hospital Stay (HOSPITAL_BASED_OUTPATIENT_CLINIC_OR_DEPARTMENT_OTHER): Payer: Medicaid Other | Admitting: Hematology and Oncology

## 2019-10-30 ENCOUNTER — Other Ambulatory Visit: Payer: Self-pay

## 2019-10-30 VITALS — BP 140/98 | HR 91 | Temp 98.4°F | Resp 17

## 2019-10-30 DIAGNOSIS — C50811 Malignant neoplasm of overlapping sites of right female breast: Secondary | ICD-10-CM | POA: Diagnosis not present

## 2019-10-30 DIAGNOSIS — Z5111 Encounter for antineoplastic chemotherapy: Secondary | ICD-10-CM | POA: Diagnosis not present

## 2019-10-30 DIAGNOSIS — C773 Secondary and unspecified malignant neoplasm of axilla and upper limb lymph nodes: Secondary | ICD-10-CM | POA: Diagnosis not present

## 2019-10-30 DIAGNOSIS — Z170 Estrogen receptor positive status [ER+]: Secondary | ICD-10-CM

## 2019-10-30 DIAGNOSIS — Z95828 Presence of other vascular implants and grafts: Secondary | ICD-10-CM

## 2019-10-30 LAB — CBC WITH DIFFERENTIAL (CANCER CENTER ONLY)
Abs Immature Granulocytes: 0.09 10*3/uL — ABNORMAL HIGH (ref 0.00–0.07)
Basophils Absolute: 0.1 10*3/uL (ref 0.0–0.1)
Basophils Relative: 1 %
Eosinophils Absolute: 0.2 10*3/uL (ref 0.0–0.5)
Eosinophils Relative: 4 %
HCT: 32.7 % — ABNORMAL LOW (ref 36.0–46.0)
Hemoglobin: 10.8 g/dL — ABNORMAL LOW (ref 12.0–15.0)
Immature Granulocytes: 2 %
Lymphocytes Relative: 21 %
Lymphs Abs: 1 10*3/uL (ref 0.7–4.0)
MCH: 30.3 pg (ref 26.0–34.0)
MCHC: 33 g/dL (ref 30.0–36.0)
MCV: 91.6 fL (ref 80.0–100.0)
Monocytes Absolute: 0.8 10*3/uL (ref 0.1–1.0)
Monocytes Relative: 18 %
Neutro Abs: 2.6 10*3/uL (ref 1.7–7.7)
Neutrophils Relative %: 54 %
Platelet Count: 228 10*3/uL (ref 150–400)
RBC: 3.57 MIL/uL — ABNORMAL LOW (ref 3.87–5.11)
RDW: 16 % — ABNORMAL HIGH (ref 11.5–15.5)
WBC Count: 4.7 10*3/uL (ref 4.0–10.5)
nRBC: 0 % (ref 0.0–0.2)

## 2019-10-30 LAB — CMP (CANCER CENTER ONLY)
ALT: 9 U/L (ref 0–44)
AST: 14 U/L — ABNORMAL LOW (ref 15–41)
Albumin: 3.8 g/dL (ref 3.5–5.0)
Alkaline Phosphatase: 76 U/L (ref 38–126)
Anion gap: 7 (ref 5–15)
BUN: 8 mg/dL (ref 6–20)
CO2: 27 mmol/L (ref 22–32)
Calcium: 9.1 mg/dL (ref 8.9–10.3)
Chloride: 105 mmol/L (ref 98–111)
Creatinine: 0.75 mg/dL (ref 0.44–1.00)
GFR, Est AFR Am: >60 mL/min (ref >60)
GFR, Estimated: >60 mL/min (ref >60)
Glucose, Bld: 94 mg/dL (ref 70–99)
Potassium: 4.2 mmol/L (ref 3.5–5.1)
Sodium: 139 mmol/L (ref 135–145)
Total Bilirubin: <0.2 mg/dL — ABNORMAL LOW (ref 0.3–1.2)
Total Protein: 7.1 g/dL (ref 6.5–8.1)

## 2019-10-30 MED ORDER — DIPHENHYDRAMINE HCL 50 MG/ML IJ SOLN
INTRAMUSCULAR | Status: AC
  Filled 2019-10-30: qty 1

## 2019-10-30 MED ORDER — FAMOTIDINE IN NACL 20-0.9 MG/50ML-% IV SOLN
20.0000 mg | Freq: Once | INTRAVENOUS | Status: AC
  Administered 2019-10-30: 20 mg via INTRAVENOUS

## 2019-10-30 MED ORDER — SODIUM CHLORIDE 0.9 % IV SOLN
80.0000 mg/m2 | Freq: Once | INTRAVENOUS | Status: AC
  Administered 2019-10-30: 144 mg via INTRAVENOUS
  Filled 2019-10-30: qty 24

## 2019-10-30 MED ORDER — SODIUM CHLORIDE 0.9 % IV SOLN
Freq: Once | INTRAVENOUS | Status: AC
  Filled 2019-10-30: qty 250

## 2019-10-30 MED ORDER — SODIUM CHLORIDE 0.9% FLUSH
10.0000 mL | INTRAVENOUS | Status: DC | PRN
  Administered 2019-10-30: 10 mL
  Filled 2019-10-30: qty 10

## 2019-10-30 MED ORDER — HEPARIN SOD (PORK) LOCK FLUSH 100 UNIT/ML IV SOLN
500.0000 [IU] | Freq: Once | INTRAVENOUS | Status: AC | PRN
  Administered 2019-10-30: 500 [IU]
  Filled 2019-10-30: qty 5

## 2019-10-30 MED ORDER — DIPHENHYDRAMINE HCL 50 MG/ML IJ SOLN
25.0000 mg | Freq: Once | INTRAMUSCULAR | Status: AC
  Administered 2019-10-30: 25 mg via INTRAVENOUS

## 2019-10-30 MED ORDER — SODIUM CHLORIDE 0.9 % IV SOLN
10.0000 mg | Freq: Once | INTRAVENOUS | Status: AC
  Administered 2019-10-30: 10 mg via INTRAVENOUS
  Filled 2019-10-30: qty 10

## 2019-10-30 MED ORDER — FAMOTIDINE IN NACL 20-0.9 MG/50ML-% IV SOLN
INTRAVENOUS | Status: AC
  Filled 2019-10-30: qty 50

## 2019-10-30 NOTE — Assessment & Plan Note (Signed)
08/17/19:Patient palpated a right breast mass and right axilla mass and noted skin redness x2 weeks. She went to the ED and had an abscess drained with no improvement. Diagnostic mammogram and US showed a 7.3cm right breast mass extending into all 4 quadrants and partially into overlying skin with 12 abnormal right axillary lymph nodes. 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  Treatment plan: 1. Neoadjuvant chemotherapy with Adriamycin and Cytoxan dose dense 4 followed byTaxolweekly 12 2. Followed by breastmastectomy and axillary lymph node dissection 3. Followed by adjuvant radiation therapy 4.Follow-up adjuvant antiestrogen therapy  CT CAP 08/31/2019: Very large central right breast mass, bulky right axillary and subpectoral lymph nodes. No evidence of distant metastatic disease. Bulky uterine fibroids Bone scan 08/31/2019: No metastatic disease in the bones ---------------------------------------------------------------------------------------------------------------------------------------------------- Current treatment:Cycle1 Taxol Echo: 08/29/2019: EF60-65%   Chemo toxicities: 1.Nausea and vomiting: Last for several days 2.Weight loss: Monitoring closely. 3.Cytopenias:We reduced thedosage of cycle 2 chemotherapy. 4.Chemotherapy-induced anemia: Monitoring closely. Today's hemoglobin is     Return to clinic in1 week for toxicity check and every 2 weeks thereafter for follow-ups.

## 2019-10-30 NOTE — Patient Instructions (Signed)

## 2019-10-30 NOTE — Patient Instructions (Addendum)
[facility] Discharge Instructions for Patients Receiving Chemotherapy  Today you received the following chemotherapy agents: Taxol.  To help prevent nausea and vomiting after your treatment, we encourage you to take your nausea medication as directed.   If you develop nausea and vomiting that is not controlled by your nausea medication, call the clinic.   BELOW ARE SYMPTOMS THAT SHOULD BE REPORTED IMMEDIATELY:  *FEVER GREATER THAN 100.5 F  *CHILLS WITH OR WITHOUT FEVER  NAUSEA AND VOMITING THAT IS NOT CONTROLLED WITH YOUR NAUSEA MEDICATION  *UNUSUAL SHORTNESS OF BREATH  *UNUSUAL BRUISING OR BLEEDING  TENDERNESS IN MOUTH AND THROAT WITH OR WITHOUT PRESENCE OF ULCERS  *URINARY PROBLEMS  *BOWEL PROBLEMS  UNUSUAL RASH Items with * indicate a potential emergency and should be followed up as soon as possible.  Feel free to call the clinic should you have any questions or concerns. The clinic phone number is [phone].  Please show the CHEMO ALERT CARD at check-in to the Emergency Department and triage nurse.  Paclitaxel injection What is this medicine? PACLITAXEL (PAK li TAX el) is a chemotherapy drug. It targets fast dividing cells, like cancer cells, and causes these cells to die. This medicine is used to treat ovarian cancer, breast cancer, lung cancer, Kaposi's sarcoma, and other cancers. This medicine may be used for other purposes; ask your health care provider or pharmacist if you have questions. COMMON BRAND NAME(S): Onxol, Taxol What should I tell my health care provider before I take this medicine? They need to know if you have any of these conditions:  history of irregular heartbeat  liver disease  low blood counts, like low white cell, platelet, or red cell counts  lung or breathing disease, like asthma  tingling of the fingers or toes, or other nerve disorder  an unusual or allergic reaction to paclitaxel, alcohol, polyoxyethylated castor  oil, other chemotherapy, other medicines, foods, dyes, or preservatives  pregnant or trying to get pregnant  breast-feeding How should I use this medicine? This drug is given as an infusion into a vein. It is administered in a hospital or clinic by a specially trained health care professional. Talk to your pediatrician regarding the use of this medicine in children. Special care may be needed. Overdosage: If you think you have taken too much of this medicine contact a poison control center or emergency room at once. NOTE: This medicine is only for you. Do not share this medicine with others. What if I miss a dose? It is important not to miss your dose. Call your doctor or health care professional if you are unable to keep an appointment. What may interact with this medicine? Do not take this medicine with any of the following medications:  disulfiram  metronidazole This medicine may also interact with the following medications:  antiviral medicines for hepatitis, HIV or AIDS  certain antibiotics like erythromycin and clarithromycin  certain medicines for fungal infections like ketoconazole and itraconazole  certain medicines for seizures like carbamazepine, phenobarbital, phenytoin  gemfibrozil  nefazodone  rifampin  St. John's wort This list may not describe all possible interactions. Give your health care provider a list of all the medicines, herbs, non-prescription drugs, or dietary supplements you use. Also tell them if you smoke, drink alcohol, or use illegal drugs. Some items may interact with your medicine. What should I watch for while using this medicine? Your condition will be monitored carefully while you are receiving this medicine. You will need important blood work   done while you are taking this medicine. This medicine can cause serious allergic reactions. To reduce your risk you will need to take other medicine(s) before treatment with this medicine. If you  experience allergic reactions like skin rash, itching or hives, swelling of the face, lips, or tongue, tell your doctor or health care professional right away. In some cases, you may be given additional medicines to help with side effects. Follow all directions for their use. This drug may make you feel generally unwell. This is not uncommon, as chemotherapy can affect healthy cells as well as cancer cells. Report any side effects. Continue your course of treatment even though you feel ill unless your doctor tells you to stop. Call your doctor or health care professional for advice if you get a fever, chills or sore throat, or other symptoms of a cold or flu. Do not treat yourself. This drug decreases your body's ability to fight infections. Try to avoid being around people who are sick. This medicine may increase your risk to bruise or bleed. Call your doctor or health care professional if you notice any unusual bleeding. Be careful brushing and flossing your teeth or using a toothpick because you may get an infection or bleed more easily. If you have any dental work done, tell your dentist you are receiving this medicine. Avoid taking products that contain aspirin, acetaminophen, ibuprofen, naproxen, or ketoprofen unless instructed by your doctor. These medicines may hide a fever. Do not become pregnant while taking this medicine. Women should inform their doctor if they wish to become pregnant or think they might be pregnant. There is a potential for serious side effects to an unborn child. Talk to your health care professional or pharmacist for more information. Do not breast-feed an infant while taking this medicine. Men are advised not to father a child while receiving this medicine. This product may contain alcohol. Ask your pharmacist or healthcare provider if this medicine contains alcohol. Be sure to tell all healthcare providers you are taking this medicine. Certain medicines, like metronidazole  and disulfiram, can cause an unpleasant reaction when taken with alcohol. The reaction includes flushing, headache, nausea, vomiting, sweating, and increased thirst. The reaction can last from 30 minutes to several hours. What side effects may I notice from receiving this medicine? Side effects that you should report to your doctor or health care professional as soon as possible:  allergic reactions like skin rash, itching or hives, swelling of the face, lips, or tongue  breathing problems  changes in vision  fast, irregular heartbeat  high or low blood pressure  mouth sores  pain, tingling, numbness in the hands or feet  signs of decreased platelets or bleeding - bruising, pinpoint red spots on the skin, black, tarry stools, blood in the urine  signs of decreased red blood cells - unusually weak or tired, feeling faint or lightheaded, falls  signs of infection - fever or chills, cough, sore throat, pain or difficulty passing urine  signs and symptoms of liver injury like dark yellow or brown urine; general ill feeling or flu-like symptoms; light-colored stools; loss of appetite; nausea; right upper belly pain; unusually weak or tired; yellowing of the eyes or skin  swelling of the ankles, feet, hands  unusually slow heartbeat Side effects that usually do not require medical attention (report to your doctor or health care professional if they continue or are bothersome):  diarrhea  hair loss  loss of appetite  muscle or joint pain    nausea, vomiting  pain, redness, or irritation at site where injected  tiredness This list may not describe all possible side effects. Call your doctor for medical advice about side effects. You may report side effects to FDA at 1-800-FDA-1088. Where should I keep my medicine? This drug is given in a hospital or clinic and will not be stored at home. NOTE: This sheet is a summary. It may not cover all possible information. If you have  questions about this medicine, talk to your doctor, pharmacist, or health care provider.  2020 Elsevier/Gold Standard (2017-02-15 13:14:55)  

## 2019-10-30 NOTE — Progress Notes (Signed)
Patient Care Team: Patient, [name] as PCP - General (General Practice) [name], RN as Oncology Nurse Navigator [name], RN as Oncology Nurse Navigator [name], MD as Consulting Physician (General Surgery) [name], MD as Consulting Physician (Hematology and Oncology) [name], MD as Consulting Physician (Radiation Oncology)  DIAGNOSIS:    ICD-10-CM   1. Malignant neoplasm of overlapping sites of right breast in female, estrogen receptor positive ([place])  C50.811    Z17.0     SUMMARY OF ONCOLOGIC HISTORY: Oncology History  Malignant neoplasm of overlapping sites of right breast in female, estrogen receptor positive ([place])  08/17/2019 Initial Diagnosis   Patient palpated a right breast mass and right axilla mass and noted skin redness x2 weeks. She went to the ED and had an abscess drained with no improvement. Diagnostic mammogram and US showed a 7.3cm right breast mass extending into all 4 quadrants and partially into overlying skin with 12 abnormal right axillary lymph nodes. Biopsy  showed IDC with necrosis in the breast and axilla, grade 3, HER-2 equivocal by IHC, negative by FISH, ER+ 40% weak, PR -, Ki67 90%.    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 The report date is 09/03/2019.  The Common Hereditary Cancers Panel offered by [facility] includes sequencing and/or deletion duplication testing of the following 48 genes: APC, ATM, AXIN2, BARD1, BMPR1A, BRCA1, BRCA2, BRIP1, CDH1, CDK4, CDKN2A (p14ARF), CDKN2A (p16INK4a), CHEK2, CTNNA1, DICER1, EPCAM (Deletion/duplication testing only), GREM1 (promoter region deletion/duplication testing only), KIT, MEN1, MLH1, MSH2, MSH3, MSH6, MUTYH, NBN, NF1, NHTL1, PALB2, PDGFRA, PMS2, POLD1, POLE, PTEN, RAD50, RAD51C, RAD51D, RNF43, SDHB, SDHC, SDHD, SMAD4, SMARCA4. STK11, TP53,  TSC1, TSC2, and VHL.  The following genes were evaluated for sequence changes only: SDHA and HOXB13 c.251G>A variant only.    09/04/2019 -  Chemotherapy   The patient had DOXOrubicin (ADRIAMYCIN) chemo injection 110 mg, 60 mg/m2 = 110 mg, Intravenous,  Once, 4 of 4 cycles Dose modification: 50 mg/m2 (original dose 60 mg/m2, Cycle 2, Reason: Provider Judgment) Administration: 110 mg (09/04/2019), 92 mg (09/18/2019), 92 mg (10/02/2019), 92 mg (10/16/2019) palonosetron (ALOXI) injection 0.25 mg, 0.25 mg, Intravenous,  Once, 4 of 4 cycles Administration: 0.25 mg (09/04/2019), 0.25 mg (09/18/2019), 0.25 mg (10/02/2019), 0.25 mg (10/16/2019) pegfilgrastim-cbqv (UDENYCA) injection 6 mg, 6 mg, Subcutaneous, Once, 4 of 4 cycles Administration: 6 mg (09/06/2019), 6 mg (09/20/2019), 6 mg (10/04/2019) cyclophosphamide (CYTOXAN) 1,100 mg in sodium chloride 0.9 % 250 mL chemo infusion, 600 mg/m2 = 1,100 mg, Intravenous,  Once, 4 of 4 cycles Dose modification: 500 mg/m2 (original dose 600 mg/m2, Cycle 2, Reason: Provider Judgment) Administration: 1,100 mg (09/04/2019), 920 mg (09/18/2019), 920 mg (10/02/2019), 920 mg (10/16/2019) PACLitaxel (TAXOL) 144 mg in sodium chloride 0.9 % 250 mL chemo infusion (</= 42m/m2), 80 mg/m2 = 144 mg, Intravenous,  Once, 0 of 12 cycles fosaprepitant (EMEND) 150 mg in sodium chloride 0.9 % 145 mL IVPB, 150 mg, Intravenous,  Once, 4 of 4 cycles Administration: 150 mg (09/04/2019), 150 mg (09/18/2019), 150 mg (10/02/2019), 150 mg (10/16/2019)  for chemotherapy treatment.      CHIEF COMPLIANT: Cycle 1 Taxol  INTERVAL HISTORY: Gina REEDYis a 49y.o. with above-mentioned history of right breast cancer currently on neoadjuvant chemotherapy with weekly Taxol after completing four cycles of dose dense Adriamycin and Cytoxan. Genetic testing showed a BRCA1 mutation.  She presents to the clinic todayfora toxicity checkand cycle 1.    ALLERGIES:  is allergic to penicillins.  MEDICATIONS:  Current  Outpatient Medications  Medication Sig Dispense Refill  . acetaminophen (TYLENOL) 325 MG tablet Take 650 mg by mouth every 6 (six) hours as needed.    Marland Kitchen ibuprofen (ADVIL) 800 MG tablet Take 1 tablet (800 mg total) by mouth 3 (three) times daily. 21 tablet 0  . lidocaine-prilocaine (EMLA) cream Apply to affected area once 30 g 3  . LORazepam (ATIVAN) 0.5 MG tablet Take 1 tablet (0.5 mg total) by mouth at bedtime as needed (Nausea or vomiting). 30 tablet 1  . ondansetron (ZOFRAN) 8 MG tablet Take 1 tablet (8 mg total) by mouth 2 (two) times daily as needed. Start on the third day after chemotherapy. (Patient not taking: Reported on 10/16/2019) 30 tablet 1  . prochlorperazine (COMPAZINE) 10 MG tablet Take 1 tablet (10 mg total) by mouth every 6 (six) hours as needed (Nausea or vomiting). (Patient not taking: Reported on 10/16/2019) 30 tablet 1  . traMADol (ULTRAM) 50 MG tablet Take 1 tablet (50 mg total) by mouth every 6 (six) hours as needed. 12 tablet 0   No current facility-administered medications for this visit.    PHYSICAL EXAMINATION: ECOG PERFORMANCE STATUS: 1 - Symptomatic but completely ambulatory  Vitals:   10/30/19 1105  BP: (!) 140/98  Pulse: 92  Resp: 18  Temp: 98.5 F (36.9 C)  SpO2: 100%   Filed Weights   10/30/19 1105  Weight: 144 lb 8 oz (65.5 kg)    LABORATORY DATA:  I have reviewed the data as listed CMP Latest Ref Rng & Units 10/16/2019 10/02/2019 09/18/2019  Glucose 70 - 99 mg/dL 96 94 102(H)  BUN 6 - 20 mg/dL 6 9 4(L)  Creatinine 0.44 - 1.00 mg/dL 0.79 0.86 0.69  Sodium 135 - 145 mmol/L 138 139 140  Potassium 3.5 - 5.1 mmol/L 4.2 4.0 3.4(L)  Chloride 98 - 111 mmol/L 106 107 105  CO2 22 - 32 mmol/L _0 Calcium 8.9 - 10.3 mg/dL 8.7(L) 8.6(L) 8.9  Total Protein 6.5 - 8.1 g/dL 6.6 6.8 7.1  Total Bilirubin 0.3 - 1.2 mg/dL <0.2(L) <0.2(L) <0.2(L)  Alkaline Phos 38 - 126 U/L 90 81 77  AST 15 - 41 U/L 13(L) 11(L) 13(L)  ALT 0 - 44 U/L _1 Lab  Results  Component Value Date   WBC 4.7 10/30/2019   HGB 10.8 (L) 10/30/2019   HCT 32.7 (L) 10/30/2019   MCV 91.6 10/30/2019   PLT 228 10/30/2019   NEUTROABS 2.6 10/30/2019    ASSESSMENT & PLAN:  Malignant neoplasm of overlapping sites of right breast in female, estrogen receptor positive ([place]) 08/17/19:Patient palpated a right breast mass and right axilla mass and noted skin redness x2 weeks. She went to the ED and had an abscess drained with no improvement. Diagnostic mammogram and US showed a 7.3cm right breast mass extending into all 4 quadrants and partially into overlying skin with 12 abnormal right axillary lymph nodes. 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  Treatment plan: 1. Neoadjuvant chemotherapy with Adriamycin and Cytoxan dose dense 4 followed byTaxolweekly 12 2. Followed by breastmastectomy and axillary lymph node dissection 3. Followed by adjuvant radiation therapy 4.Follow-up adjuvant antiestrogen therapy  CT CAP 08/31/2019: Very large central right breast  mass, bulky right axillary and subpectoral lymph nodes. No evidence of distant metastatic disease. Bulky uterine fibroids Bone scan 08/31/2019: No metastatic disease in the bones ---------------------------------------------------------------------------------------------------------------------------------------------------- Current treatment:Cycle1 Taxol Echo: 08/29/2019: EF60-65%   Chemo toxicities: Chemotherapy-induced anemia: Hemoglobin today is better at 10.8  Return to clinic in1 week for toxicity check and every 2 weeks thereafter for follow-ups.    No orders of the defined types were placed in this encounter.  The patient has a good understanding of the overall plan. she agrees with it. she will call with any problems that may develop before the next visit here.  Total time spent: 30 mins  including face to face time and time spent for planning, charting and coordination of care  [name], MD 10/30/2019  I, Cloyde Reams Dorshimer, am acting as scribe for Dr. [name].  I have reviewed the above documentation for accuracy and completeness, and I agree with the above.

## 2019-10-31 ENCOUNTER — Telehealth: Payer: Self-pay | Admitting: *Deleted

## 2019-10-31 NOTE — Progress Notes (Signed)
Pharmacist Chemotherapy Monitoring - Follow Up Assessment    I verify that I have reviewed each item in the below checklist:  . Regimen for the patient is scheduled for the appropriate day and plan matches scheduled date. Marland Kitchen Appropriate non-routine labs are ordered dependent on drug ordered. . If applicable, additional medications reviewed and ordered per protocol based on lifetime cumulative doses and/or treatment regimen.   Plan for follow-up and/or issues identified: No . I-vent associated with next due treatment: No . MD and/or nursing notified: No   Kennith Center, Pharm.D., CPP 10/31/2019@2 :46 PM

## 2019-11-02 ENCOUNTER — Encounter: Payer: Self-pay | Admitting: Genetic Counselor

## 2019-11-05 NOTE — Progress Notes (Signed)
Patient Care Team: Patient, [name] as PCP - General (General Practice) [name], RN as Oncology Nurse Navigator [name], RN as Oncology Nurse Navigator [name], MD as Consulting Physician (General Surgery) [name], MD as Consulting Physician (Hematology and Oncology) [name], MD as Consulting Physician (Radiation Oncology)  DIAGNOSIS:    ICD-10-CM   1. Malignant neoplasm of overlapping sites of right breast in female, estrogen receptor positive ([place])  C50.811    Z17.0     SUMMARY OF ONCOLOGIC HISTORY: Oncology History  Malignant neoplasm of overlapping sites of right breast in female, estrogen receptor positive ([place])  08/17/2019 Initial Diagnosis   Patient palpated a right breast mass and right axilla mass and noted skin redness x2 weeks. She went to the ED and had an abscess drained with no improvement. Diagnostic mammogram and US showed a 7.3cm right breast mass extending into all 4 quadrants and partially into overlying skin with 12 abnormal right axillary lymph nodes. Biopsy  showed IDC with necrosis in the breast and axilla, grade 3, HER-2 equivocal by IHC, negative by FISH, ER+ 40% weak, PR -, Ki67 90%.    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 The report date is 09/03/2019.  The Common Hereditary Cancers Panel offered by [facility] includes sequencing and/or deletion duplication testing of the following 48 genes: APC, ATM, AXIN2, BARD1, BMPR1A, BRCA1, BRCA2, BRIP1, CDH1, CDK4, CDKN2A (p14ARF), CDKN2A (p16INK4a), CHEK2, CTNNA1, DICER1, EPCAM (Deletion/duplication testing only), GREM1 (promoter region deletion/duplication testing only), KIT, MEN1, MLH1, MSH2, MSH3, MSH6, MUTYH, NBN, NF1, NHTL1, PALB2, PDGFRA, PMS2, POLD1, POLE, PTEN, RAD50, RAD51C, RAD51D, RNF43, SDHB, SDHC, SDHD, SMAD4, SMARCA4. STK11, TP53,  TSC1, TSC2, and VHL.  The following genes were evaluated for sequence changes only: SDHA and HOXB13 c.251G>A variant only.    09/04/2019 -  Chemotherapy   The patient had DOXOrubicin (ADRIAMYCIN) chemo injection 110 mg, 60 mg/m2 = 110 mg, Intravenous,  Once, 4 of 4 cycles Dose modification: 50 mg/m2 (original dose 60 mg/m2, Cycle 2, Reason: Provider Judgment) Administration: 110 mg (09/04/2019), 92 mg (09/18/2019), 92 mg (10/02/2019), 92 mg (10/16/2019) palonosetron (ALOXI) injection 0.25 mg, 0.25 mg, Intravenous,  Once, 4 of 4 cycles Administration: 0.25 mg (09/04/2019), 0.25 mg (09/18/2019), 0.25 mg (10/02/2019), 0.25 mg (10/16/2019) pegfilgrastim-cbqv (UDENYCA) injection 6 mg, 6 mg, Subcutaneous, Once, 4 of 4 cycles Administration: 6 mg (09/06/2019), 6 mg (09/20/2019), 6 mg (10/04/2019), 6 mg (10/18/2019) cyclophosphamide (CYTOXAN) 1,100 mg in sodium chloride 0.9 % 250 mL chemo infusion, 600 mg/m2 = 1,100 mg, Intravenous,  Once, 4 of 4 cycles Dose modification: 500 mg/m2 (original dose 600 mg/m2, Cycle 2, Reason: Provider Judgment) Administration: 1,100 mg (09/04/2019), 920 mg (09/18/2019), 920 mg (10/02/2019), 920 mg (10/16/2019) PACLitaxel (TAXOL) 144 mg in sodium chloride 0.9 % 250 mL chemo infusion (</= 92m/m2), 80 mg/m2 = 144 mg, Intravenous,  Once, 1 of 12 cycles Dose modification: 65 mg/m2 (original dose 80 mg/m2, Cycle 6, Reason: Dose not tolerated) Administration: 144 mg (10/30/2019) fosaprepitant (EMEND) 150 mg in sodium chloride 0.9 % 145 mL IVPB, 150 mg, Intravenous,  Once, 4 of 4 cycles Administration: 150 mg (09/04/2019), 150 mg (09/18/2019), 150 mg (10/02/2019), 150 mg (10/16/2019)  for chemotherapy treatment.      CHIEF COMPLIANT: Cycle 2 Taxol  INTERVAL HISTORY: UYESSENIA MAILLETis a 49y.o. with above-mentioned history of right breast cancer currently on  neoadjuvant chemotherapy with weekly Taxol after completing four cycles of dose dense Adriamycin and Cytoxan. She presents to the clinic todayfora  toxicity checkand cycle2.  She felt very sleepy with Benadryl but otherwise tolerated it fairly well.  She has mild fatigue.  Denies any nausea or vomiting.  Did not have any reaction to Taxol.  ALLERGIES:  is allergic to penicillins.  MEDICATIONS:  Current Outpatient Medications  Medication Sig Dispense Refill  . acetaminophen (TYLENOL) 325 MG tablet Take 650 mg by mouth every 6 (six) hours as needed.    Marland Kitchen ibuprofen (ADVIL) 800 MG tablet Take 1 tablet (800 mg total) by mouth 3 (three) times daily. 21 tablet 0  . lidocaine-prilocaine (EMLA) cream Apply to affected area once 30 g 3  . LORazepam (ATIVAN) 0.5 MG tablet Take 1 tablet (0.5 mg total) by mouth at bedtime as needed (Nausea or vomiting). 30 tablet 1  . ondansetron (ZOFRAN) 8 MG tablet Take 1 tablet (8 mg total) by mouth 2 (two) times daily as needed. Start on the third day after chemotherapy. (Patient not taking: Reported on 10/16/2019) 30 tablet 1  . prochlorperazine (COMPAZINE) 10 MG tablet Take 1 tablet (10 mg total) by mouth every 6 (six) hours as needed (Nausea or vomiting). (Patient not taking: Reported on 10/16/2019) 30 tablet 1  . traMADol (ULTRAM) 50 MG tablet Take 1 tablet (50 mg total) by mouth every 6 (six) hours as needed. 12 tablet 0   No current facility-administered medications for this visit.    PHYSICAL EXAMINATION: ECOG PERFORMANCE STATUS: 1 - Symptomatic but completely ambulatory  Vitals:   11/06/19 1142  BP: (!) 140/97  Pulse: 90  Resp: 20  Temp: 98.5 F (36.9 C)  SpO2: 100%   Filed Weights   11/06/19 1142  Weight: 146 lb 12.8 oz (66.6 kg)    LABORATORY DATA:  I have reviewed the data as listed CMP Latest Ref Rng & Units 11/06/2019 10/30/2019 10/16/2019  Glucose 70 - 99 mg/dL 90 94 96  BUN 6 - 20 mg/dL _0 Creatinine 0.44 - 1.00 mg/dL 0.68 0.75 0.79  Sodium 135 - 145 mmol/L 140 139 138  Potassium 3.5 - 5.1 mmol/L 3.9 4.2 4.2  Chloride 98 - 111 mmol/L 107 105 106  CO2 22 - 32 mmol/L _1 Calcium 8.9 - 10.3 mg/dL 8.6(L) 9.1 8.7(L)  Total Protein 6.5 - 8.1 g/dL 6.5 7.1 6.6  Total Bilirubin 0.3 - 1.2 mg/dL <0.2(L) <0.2(L) <0.2(L)  Alkaline Phos 38 - 126 U/L 63 76 90  AST 15 - 41 U/L 22 14(L) 13(L)  ALT 0 - 44 U/L _2 Lab Results  Component Value Date   WBC 3.1 (L) 11/06/2019   HGB 9.8 (L) 11/06/2019   HCT 29.3 (L) 11/06/2019   MCV 93.0 11/06/2019   PLT 333 11/06/2019   NEUTROABS 1.3 (L) 11/06/2019    ASSESSMENT & PLAN:  Malignant neoplasm of overlapping sites of right breast in female, estrogen receptor positive ([place]) 08/17/19:Patient palpated a right breast mass and right axilla mass and noted skin redness x2 weeks. She went to the ED and had an abscess drained with no improvement. Diagnostic mammogram and US showed a 7.3cm right breast mass extending into all 4 quadrants and partially into overlying skin with 12 abnormal right axillary lymph nodes. Biopsy showed IDC with necrosis in the breast and axilla, grade 3, HER-2 equivocal by IHC, negative by FISH, ER+ 40% weak, PR -,  Ki67 90%.  Treatment plan: 1. Neoadjuvant chemotherapy with Adriamycin and Cytoxan dose dense 4 followed byTaxolweekly 12 2. Followed by breastmastectomy and axillary lymph node dissection 3. Followed by adjuvant radiation therapy 4.Follow-up adjuvant antiestrogen therapy  CT CAP 08/31/2019: Very large central right breast mass, bulky right axillary and subpectoral lymph nodes. No evidence of distant metastatic disease. Bulky uterine fibroids Bone scan 08/31/2019: No metastatic disease in the bones ---------------------------------------------------------------------------------------------------------------------------------------------------- Current treatment:Cycle2 Taxol Echo: 08/29/2019: EF60-65%   Chemo toxicities: Chemotherapy-induced anemia: Hemoglobin today is 9.8 ANC 1.3: We will reduce the dosage of Taxol to 65 mg/m.  Return to clinic weekly for Taxol and  follow-up with me every 2 weeks     No orders of the defined types were placed in this encounter.  The patient has a good understanding of the overall plan. she agrees with it. she will call with any problems that may develop before the next visit here.  Total time spent: 30 mins including face to face time and time spent for planning, charting and coordination of care  [name], MD 11/06/2019  I, Cloyde Reams Dorshimer, am acting as scribe for Dr. [name].  I have reviewed the above documentation for accuracy and completeness, and I agree with the above.

## 2019-11-06 ENCOUNTER — Inpatient Hospital Stay: Payer: Medicaid Other

## 2019-11-06 ENCOUNTER — Encounter: Payer: Self-pay | Admitting: *Deleted

## 2019-11-06 ENCOUNTER — Inpatient Hospital Stay (HOSPITAL_BASED_OUTPATIENT_CLINIC_OR_DEPARTMENT_OTHER): Payer: Medicaid Other | Admitting: Hematology and Oncology

## 2019-11-06 ENCOUNTER — Other Ambulatory Visit: Payer: Self-pay

## 2019-11-06 DIAGNOSIS — C50811 Malignant neoplasm of overlapping sites of right female breast: Secondary | ICD-10-CM

## 2019-11-06 DIAGNOSIS — Z95828 Presence of other vascular implants and grafts: Secondary | ICD-10-CM

## 2019-11-06 DIAGNOSIS — Z170 Estrogen receptor positive status [ER+]: Secondary | ICD-10-CM

## 2019-11-06 DIAGNOSIS — Z5111 Encounter for antineoplastic chemotherapy: Secondary | ICD-10-CM | POA: Diagnosis not present

## 2019-11-06 LAB — CBC WITH DIFFERENTIAL (CANCER CENTER ONLY)
Abs Immature Granulocytes: 0.02 K/uL (ref 0.00–0.07)
Basophils Absolute: 0.1 K/uL (ref 0.0–0.1)
Basophils Relative: 2 %
Eosinophils Absolute: 0.2 K/uL (ref 0.0–0.5)
Eosinophils Relative: 5 %
HCT: 29.3 % — ABNORMAL LOW (ref 36.0–46.0)
Hemoglobin: 9.8 g/dL — ABNORMAL LOW (ref 12.0–15.0)
Immature Granulocytes: 1 %
Lymphocytes Relative: 34 %
Lymphs Abs: 1.1 K/uL (ref 0.7–4.0)
MCH: 31.1 pg (ref 26.0–34.0)
MCHC: 33.4 g/dL (ref 30.0–36.0)
MCV: 93 fL (ref 80.0–100.0)
Monocytes Absolute: 0.5 K/uL (ref 0.1–1.0)
Monocytes Relative: 16 %
Neutro Abs: 1.3 K/uL — ABNORMAL LOW (ref 1.7–7.7)
Neutrophils Relative %: 42 %
Platelet Count: 333 K/uL (ref 150–400)
RBC: 3.15 MIL/uL — ABNORMAL LOW (ref 3.87–5.11)
RDW: 16.4 % — ABNORMAL HIGH (ref 11.5–15.5)
WBC Count: 3.1 K/uL — ABNORMAL LOW (ref 4.0–10.5)
nRBC: 0 % (ref 0.0–0.2)

## 2019-11-06 LAB — CMP (CANCER CENTER ONLY)
ALT: 22 U/L (ref 0–44)
AST: 22 U/L (ref 15–41)
Albumin: 3.5 g/dL (ref 3.5–5.0)
Alkaline Phosphatase: 63 U/L (ref 38–126)
Anion gap: 8 (ref 5–15)
BUN: 10 mg/dL (ref 6–20)
CO2: 25 mmol/L (ref 22–32)
Calcium: 8.6 mg/dL — ABNORMAL LOW (ref 8.9–10.3)
Chloride: 107 mmol/L (ref 98–111)
Creatinine: 0.68 mg/dL (ref 0.44–1.00)
GFR, Est AFR Am: >60 mL/min (ref >60)
GFR, Estimated: >60 mL/min (ref >60)
Glucose, Bld: 90 mg/dL (ref 70–99)
Potassium: 3.9 mmol/L (ref 3.5–5.1)
Sodium: 140 mmol/L (ref 135–145)
Total Bilirubin: <0.2 mg/dL — ABNORMAL LOW (ref 0.3–1.2)
Total Protein: 6.5 g/dL (ref 6.5–8.1)

## 2019-11-06 MED ORDER — SODIUM CHLORIDE 0.9 % IV SOLN
Freq: Once | INTRAVENOUS | Status: AC
  Filled 2019-11-06: qty 250

## 2019-11-06 MED ORDER — FAMOTIDINE IN NACL 20-0.9 MG/50ML-% IV SOLN
20.0000 mg | Freq: Once | INTRAVENOUS | Status: AC
  Administered 2019-11-06: 20 mg via INTRAVENOUS

## 2019-11-06 MED ORDER — SODIUM CHLORIDE 0.9 % IV SOLN
10.0000 mg | Freq: Once | INTRAVENOUS | Status: AC
  Administered 2019-11-06: 10 mg via INTRAVENOUS
  Filled 2019-11-06: qty 10

## 2019-11-06 MED ORDER — FAMOTIDINE IN NACL 20-0.9 MG/50ML-% IV SOLN
INTRAVENOUS | Status: AC
  Filled 2019-11-06: qty 50

## 2019-11-06 MED ORDER — HEPARIN SOD (PORK) LOCK FLUSH 100 UNIT/ML IV SOLN
500.0000 [IU] | Freq: Once | INTRAVENOUS | Status: AC | PRN
  Administered 2019-11-06: 500 [IU]
  Filled 2019-11-06: qty 5

## 2019-11-06 MED ORDER — SODIUM CHLORIDE 0.9% FLUSH
10.0000 mL | INTRAVENOUS | Status: DC | PRN
  Administered 2019-11-06: 10 mL
  Filled 2019-11-06: qty 10

## 2019-11-06 MED ORDER — DIPHENHYDRAMINE HCL 50 MG/ML IJ SOLN
25.0000 mg | Freq: Once | INTRAMUSCULAR | Status: AC
  Administered 2019-11-06: 25 mg via INTRAVENOUS

## 2019-11-06 MED ORDER — SODIUM CHLORIDE 0.9 % IV SOLN
65.0000 mg/m2 | Freq: Once | INTRAVENOUS | Status: AC
  Administered 2019-11-06: 120 mg via INTRAVENOUS
  Filled 2019-11-06: qty 20

## 2019-11-06 MED ORDER — DIPHENHYDRAMINE HCL 50 MG/ML IJ SOLN
INTRAMUSCULAR | Status: AC
  Filled 2019-11-06: qty 1

## 2019-11-06 NOTE — Assessment & Plan Note (Signed)
08/17/19:Patient palpated a right breast mass and right axilla mass and noted skin redness x2 weeks. She went to the ED and had an abscess drained with no improvement. Diagnostic mammogram and US showed a 7.3cm right breast mass extending into all 4 quadrants and partially into overlying skin with 12 abnormal right axillary lymph nodes. 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  Treatment plan: 1. Neoadjuvant chemotherapy with Adriamycin and Cytoxan dose dense 4 followed byTaxolweekly 12 2. Followed by breastmastectomy and axillary lymph node dissection 3. Followed by adjuvant radiation therapy 4.Follow-up adjuvant antiestrogen therapy  CT CAP 08/31/2019: Very large central right breast mass, bulky right axillary and subpectoral lymph nodes. No evidence of distant metastatic disease. Bulky uterine fibroids Bone scan 08/31/2019: No metastatic disease in the bones ---------------------------------------------------------------------------------------------------------------------------------------------------- Current treatment:Cycle2 Taxol Echo: 08/29/2019: EF60-65%   Chemo toxicities: Chemotherapy-induced anemia: Hemoglobin today is   Return to clinic weekly for Taxol and follow-up with me every 2 weeks

## 2019-11-06 NOTE — Patient Instructions (Signed)
[facility] Discharge Instructions for Patients Receiving Chemotherapy  Today you received the following chemotherapy agents: Taxol.  To help prevent nausea and vomiting after your treatment, we encourage you to take your nausea medication as directed.   If you develop nausea and vomiting that is not controlled by your nausea medication, call the clinic.   BELOW ARE SYMPTOMS THAT SHOULD BE REPORTED IMMEDIATELY:  *FEVER GREATER THAN 100.5 F  *CHILLS WITH OR WITHOUT FEVER  NAUSEA AND VOMITING THAT IS NOT CONTROLLED WITH YOUR NAUSEA MEDICATION  *UNUSUAL SHORTNESS OF BREATH  *UNUSUAL BRUISING OR BLEEDING  TENDERNESS IN MOUTH AND THROAT WITH OR WITHOUT PRESENCE OF ULCERS  *URINARY PROBLEMS  *BOWEL PROBLEMS  UNUSUAL RASH Items with * indicate a potential emergency and should be followed up as soon as possible.  Feel free to call the clinic should you have any questions or concerns. The clinic phone number is [phone].  Please show the CHEMO ALERT CARD at check-in to the Emergency Department and triage nurse.  Paclitaxel injection What is this medicine? PACLITAXEL (PAK li TAX el) is a chemotherapy drug. It targets fast dividing cells, like cancer cells, and causes these cells to die. This medicine is used to treat ovarian cancer, breast cancer, lung cancer, Kaposi's sarcoma, and other cancers. This medicine may be used for other purposes; ask your health care provider or pharmacist if you have questions. COMMON BRAND NAME(S): Onxol, Taxol What should I tell my health care provider before I take this medicine? They need to know if you have any of these conditions:  history of irregular heartbeat  liver disease  low blood counts, like low white cell, platelet, or red cell counts  lung or breathing disease, like asthma  tingling of the fingers or toes, or other nerve disorder  an unusual or allergic reaction to paclitaxel, alcohol, polyoxyethylated castor  oil, other chemotherapy, other medicines, foods, dyes, or preservatives  pregnant or trying to get pregnant  breast-feeding How should I use this medicine? This drug is given as an infusion into a vein. It is administered in a hospital or clinic by a specially trained health care professional. Talk to your pediatrician regarding the use of this medicine in children. Special care may be needed. Overdosage: If you think you have taken too much of this medicine contact a poison control center or emergency room at once. NOTE: This medicine is only for you. Do not share this medicine with others. What if I miss a dose? It is important not to miss your dose. Call your doctor or health care professional if you are unable to keep an appointment. What may interact with this medicine? Do not take this medicine with any of the following medications:  disulfiram  metronidazole This medicine may also interact with the following medications:  antiviral medicines for hepatitis, HIV or AIDS  certain antibiotics like erythromycin and clarithromycin  certain medicines for fungal infections like ketoconazole and itraconazole  certain medicines for seizures like carbamazepine, phenobarbital, phenytoin  gemfibrozil  nefazodone  rifampin  St. John's wort This list may not describe all possible interactions. Give your health care provider a list of all the medicines, herbs, non-prescription drugs, or dietary supplements you use. Also tell them if you smoke, drink alcohol, or use illegal drugs. Some items may interact with your medicine. What should I watch for while using this medicine? Your condition will be monitored carefully while you are receiving this medicine. You will need important blood work   done while you are taking this medicine. This medicine can cause serious allergic reactions. To reduce your risk you will need to take other medicine(s) before treatment with this medicine. If you  experience allergic reactions like skin rash, itching or hives, swelling of the face, lips, or tongue, tell your doctor or health care professional right away. In some cases, you may be given additional medicines to help with side effects. Follow all directions for their use. This drug may make you feel generally unwell. This is not uncommon, as chemotherapy can affect healthy cells as well as cancer cells. Report any side effects. Continue your course of treatment even though you feel ill unless your doctor tells you to stop. Call your doctor or health care professional for advice if you get a fever, chills or sore throat, or other symptoms of a cold or flu. Do not treat yourself. This drug decreases your body's ability to fight infections. Try to avoid being around people who are sick. This medicine may increase your risk to bruise or bleed. Call your doctor or health care professional if you notice any unusual bleeding. Be careful brushing and flossing your teeth or using a toothpick because you may get an infection or bleed more easily. If you have any dental work done, tell your dentist you are receiving this medicine. Avoid taking products that contain aspirin, acetaminophen, ibuprofen, naproxen, or ketoprofen unless instructed by your doctor. These medicines may hide a fever. Do not become pregnant while taking this medicine. Women should inform their doctor if they wish to become pregnant or think they might be pregnant. There is a potential for serious side effects to an unborn child. Talk to your health care professional or pharmacist for more information. Do not breast-feed an infant while taking this medicine. Men are advised not to father a child while receiving this medicine. This product may contain alcohol. Ask your pharmacist or healthcare provider if this medicine contains alcohol. Be sure to tell all healthcare providers you are taking this medicine. Certain medicines, like metronidazole  and disulfiram, can cause an unpleasant reaction when taken with alcohol. The reaction includes flushing, headache, nausea, vomiting, sweating, and increased thirst. The reaction can last from 30 minutes to several hours. What side effects may I notice from receiving this medicine? Side effects that you should report to your doctor or health care professional as soon as possible:  allergic reactions like skin rash, itching or hives, swelling of the face, lips, or tongue  breathing problems  changes in vision  fast, irregular heartbeat  high or low blood pressure  mouth sores  pain, tingling, numbness in the hands or feet  signs of decreased platelets or bleeding - bruising, pinpoint red spots on the skin, black, tarry stools, blood in the urine  signs of decreased red blood cells - unusually weak or tired, feeling faint or lightheaded, falls  signs of infection - fever or chills, cough, sore throat, pain or difficulty passing urine  signs and symptoms of liver injury like dark yellow or brown urine; general ill feeling or flu-like symptoms; light-colored stools; loss of appetite; nausea; right upper belly pain; unusually weak or tired; yellowing of the eyes or skin  swelling of the ankles, feet, hands  unusually slow heartbeat Side effects that usually do not require medical attention (report to your doctor or health care professional if they continue or are bothersome):  diarrhea  hair loss  loss of appetite  muscle or joint pain    nausea, vomiting  pain, redness, or irritation at site where injected  tiredness This list may not describe all possible side effects. Call your doctor for medical advice about side effects. You may report side effects to FDA at 1-800-FDA-1088. Where should I keep my medicine? This drug is given in a hospital or clinic and will not be stored at home. NOTE: This sheet is a summary. It may not cover all possible information. If you have  questions about this medicine, talk to your doctor, pharmacist, or health care provider.  2020 Elsevier/Gold Standard (2017-02-15 13:14:55)  

## 2019-11-06 NOTE — Progress Notes (Signed)
Per MD okay to treat with ANC of 1.3.   MD will reduce dosage of taxol.

## 2019-11-07 NOTE — Progress Notes (Signed)
Pharmacist Chemotherapy Monitoring - Follow Up Assessment    I verify that I have reviewed each item in the below checklist:  . Regimen for the patient is scheduled for the appropriate day and plan matches scheduled date. Marland Kitchen Appropriate non-routine labs are ordered dependent on drug ordered. . If applicable, additional medications reviewed and ordered per protocol based on lifetime cumulative doses and/or treatment regimen.   Plan for follow-up and/or issues identified: No . I-vent associated with next due treatment: No . MD and/or nursing notified: No  Gina Ross D 11/07/2019 2:56 PM

## 2019-11-13 ENCOUNTER — Inpatient Hospital Stay: Payer: Medicaid Other

## 2019-11-13 ENCOUNTER — Encounter: Payer: Self-pay | Admitting: *Deleted

## 2019-11-13 ENCOUNTER — Other Ambulatory Visit: Payer: Self-pay

## 2019-11-13 ENCOUNTER — Other Ambulatory Visit: Payer: Self-pay | Admitting: Hematology and Oncology

## 2019-11-13 VITALS — BP 152/96 | HR 82 | Temp 98.3°F | Resp 18

## 2019-11-13 DIAGNOSIS — Z95828 Presence of other vascular implants and grafts: Secondary | ICD-10-CM

## 2019-11-13 DIAGNOSIS — Z5111 Encounter for antineoplastic chemotherapy: Secondary | ICD-10-CM | POA: Diagnosis not present

## 2019-11-13 DIAGNOSIS — Z170 Estrogen receptor positive status [ER+]: Secondary | ICD-10-CM

## 2019-11-13 LAB — CBC WITH DIFFERENTIAL (CANCER CENTER ONLY)
Abs Immature Granulocytes: 0.03 10*3/uL (ref 0.00–0.07)
Basophils Absolute: 0 10*3/uL (ref 0.0–0.1)
Basophils Relative: 1 %
Eosinophils Absolute: 0.2 10*3/uL (ref 0.0–0.5)
Eosinophils Relative: 6 %
HCT: 30.1 % — ABNORMAL LOW (ref 36.0–46.0)
Hemoglobin: 10.1 g/dL — ABNORMAL LOW (ref 12.0–15.0)
Immature Granulocytes: 1 %
Lymphocytes Relative: 37 %
Lymphs Abs: 1 10*3/uL (ref 0.7–4.0)
MCH: 31.3 pg (ref 26.0–34.0)
MCHC: 33.6 g/dL (ref 30.0–36.0)
MCV: 93.2 fL (ref 80.0–100.0)
Monocytes Absolute: 0.5 10*3/uL (ref 0.1–1.0)
Monocytes Relative: 18 %
Neutro Abs: 1 10*3/uL — ABNORMAL LOW (ref 1.7–7.7)
Neutrophils Relative %: 37 %
Platelet Count: 316 10*3/uL (ref 150–400)
RBC: 3.23 MIL/uL — ABNORMAL LOW (ref 3.87–5.11)
RDW: 17.2 % — ABNORMAL HIGH (ref 11.5–15.5)
WBC Count: 2.8 10*3/uL — ABNORMAL LOW (ref 4.0–10.5)
nRBC: 0 % (ref 0.0–0.2)

## 2019-11-13 LAB — CMP (CANCER CENTER ONLY)
ALT: 61 U/L — ABNORMAL HIGH (ref 0–44)
AST: 53 U/L — ABNORMAL HIGH (ref 15–41)
Albumin: 3.6 g/dL (ref 3.5–5.0)
Alkaline Phosphatase: 53 U/L (ref 38–126)
Anion gap: 10 (ref 5–15)
BUN: 7 mg/dL (ref 6–20)
CO2: 26 mmol/L (ref 22–32)
Calcium: 9 mg/dL (ref 8.9–10.3)
Chloride: 105 mmol/L (ref 98–111)
Creatinine: 0.89 mg/dL (ref 0.44–1.00)
GFR, Est AFR Am: >60 mL/min (ref >60)
GFR, Estimated: >60 mL/min (ref >60)
Glucose, Bld: 98 mg/dL (ref 70–99)
Potassium: 3.8 mmol/L (ref 3.5–5.1)
Sodium: 141 mmol/L (ref 135–145)
Total Bilirubin: 0.2 mg/dL — ABNORMAL LOW (ref 0.3–1.2)
Total Protein: 6.5 g/dL (ref 6.5–8.1)

## 2019-11-13 MED ORDER — SODIUM CHLORIDE 0.9 % IV SOLN
10.0000 mg | Freq: Once | INTRAVENOUS | Status: AC
  Administered 2019-11-13: 10 mg via INTRAVENOUS
  Filled 2019-11-13: qty 10

## 2019-11-13 MED ORDER — FAMOTIDINE IN NACL 20-0.9 MG/50ML-% IV SOLN
INTRAVENOUS | Status: AC
  Filled 2019-11-13: qty 50

## 2019-11-13 MED ORDER — FAMOTIDINE IN NACL 20-0.9 MG/50ML-% IV SOLN
20.0000 mg | Freq: Once | INTRAVENOUS | Status: AC
  Administered 2019-11-13: 20 mg via INTRAVENOUS

## 2019-11-13 MED ORDER — SODIUM CHLORIDE 0.9% FLUSH
10.0000 mL | INTRAVENOUS | Status: DC | PRN
  Filled 2019-11-13: qty 10

## 2019-11-13 MED ORDER — SODIUM CHLORIDE 0.9 % IV SOLN
Freq: Once | INTRAVENOUS | Status: AC
  Filled 2019-11-13: qty 250

## 2019-11-13 MED ORDER — DIPHENHYDRAMINE HCL 50 MG/ML IJ SOLN
25.0000 mg | Freq: Once | INTRAMUSCULAR | Status: AC
  Administered 2019-11-13: 25 mg via INTRAVENOUS

## 2019-11-13 MED ORDER — HEPARIN SOD (PORK) LOCK FLUSH 100 UNIT/ML IV SOLN
500.0000 [IU] | Freq: Once | INTRAVENOUS | Status: AC | PRN
  Administered 2019-11-13: 500 [IU]
  Filled 2019-11-13: qty 5

## 2019-11-13 MED ORDER — FAMOTIDINE IN NACL 20-0.9 MG/50ML-% IV SOLN: 20.0000 mg | Freq: Once | INTRAVENOUS | Status: DC

## 2019-11-13 MED ORDER — SODIUM CHLORIDE 0.9% FLUSH
10.0000 mL | INTRAVENOUS | Status: DC | PRN
  Administered 2019-11-13 (×2): 10 mL
  Filled 2019-11-13: qty 10

## 2019-11-13 MED ORDER — DIPHENHYDRAMINE HCL 50 MG/ML IJ SOLN
INTRAMUSCULAR | Status: AC
  Filled 2019-11-13: qty 1

## 2019-11-13 MED ORDER — SODIUM CHLORIDE 0.9 % IV SOLN
50.0000 mg/m2 | Freq: Once | INTRAVENOUS | Status: AC
  Administered 2019-11-13: 90 mg via INTRAVENOUS
  Filled 2019-11-13: qty 15

## 2019-11-13 NOTE — Progress Notes (Signed)
Per MD, okay to treat with ANC 1.0.  MD will decrease dose of Taxol.

## 2019-11-13 NOTE — Patient Instructions (Signed)
[facility] Discharge Instructions for Patients Receiving Chemotherapy  Today you received the following chemotherapy agents:  Taxol.  To help prevent nausea and vomiting after your treatment, we encourage you to take your nausea medication as directed.   If you develop nausea and vomiting that is not controlled by your nausea medication, call the clinic.   BELOW ARE SYMPTOMS THAT SHOULD BE REPORTED IMMEDIATELY:  *FEVER GREATER THAN 100.5 F  *CHILLS WITH OR WITHOUT FEVER  NAUSEA AND VOMITING THAT IS NOT CONTROLLED WITH YOUR NAUSEA MEDICATION  *UNUSUAL SHORTNESS OF BREATH  *UNUSUAL BRUISING OR BLEEDING  TENDERNESS IN MOUTH AND THROAT WITH OR WITHOUT PRESENCE OF ULCERS  *URINARY PROBLEMS  *BOWEL PROBLEMS  UNUSUAL RASH Items with * indicate a potential emergency and should be followed up as soon as possible.  Feel free to call the clinic should you have any questions or concerns. The clinic phone number is [phone].  Please show the CHEMO ALERT CARD at check-in to the Emergency Department and triage nurse.   

## 2019-11-19 NOTE — Progress Notes (Signed)
Patient Care Team: Patient, [name] as PCP - General (General Practice) [name], RN as Oncology Nurse Navigator [name], RN as Oncology Nurse Navigator [name], MD as Consulting Physician (General Surgery) [name], MD as Consulting Physician (Hematology and Oncology) [name], MD as Consulting Physician (Radiation Oncology)  DIAGNOSIS:    ICD-10-CM   1. 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 She went to the ED and had an abscess drained with no improvement. Diagnostic mammogram and US showed a 7.3cm right breast mass extending into all 4 quadrants and partially into overlying skin with 12 abnormal right axillary lymph nodes. Biopsy  showed IDC with necrosis in the breast and axilla, grade 3, HER-2 equivocal by IHC, negative by FISH, ER+ 40% weak, PR -, Ki67 90%.    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 The report date is 09/03/2019.  The Common Hereditary Cancers Panel offered by [facility] includes sequencing and/or deletion duplication testing of the following 48 genes: APC, ATM, AXIN2, BARD1, BMPR1A, BRCA1, BRCA2, BRIP1, CDH1, CDK4, CDKN2A (p14ARF), CDKN2A (p16INK4a), CHEK2, CTNNA1, DICER1, EPCAM (Deletion/duplication testing only), GREM1 (promoter region deletion/duplication testing only), KIT, MEN1, MLH1, MSH2, MSH3, MSH6, MUTYH, NBN, NF1, NHTL1, PALB2, PDGFRA, PMS2, POLD1, POLE, PTEN, RAD50, RAD51C, RAD51D, RNF43, SDHB, SDHC, SDHD, SMAD4, SMARCA4. STK11, TP53,  TSC1, TSC2, and VHL.  The following genes were evaluated for sequence changes only: SDHA and HOXB13 c.251G>A variant only.    09/04/2019 -  Chemotherapy   The patient had DOXOrubicin (ADRIAMYCIN) chemo injection 110 mg, 60 mg/m2 = 110 mg, Intravenous,  Once, 4 of 4 cycles Dose modification: 50 mg/m2 (original dose 60 mg/m2, Cycle 2, Reason: Provider Judgment) Administration: 110 mg (09/04/2019), 92 mg (09/18/2019), 92 mg (10/02/2019), 92 mg (10/16/2019) palonosetron (ALOXI) injection 0.25 mg, 0.25 mg, Intravenous,  Once, 4 of 4 cycles Administration: 0.25 mg (09/04/2019), 0.25 mg (09/18/2019), 0.25 mg (10/02/2019), 0.25 mg (10/16/2019) pegfilgrastim-cbqv (UDENYCA) injection 6 mg, 6 mg, Subcutaneous, Once, 4 of 4 cycles Administration: 6 mg (09/06/2019), 6 mg (09/20/2019), 6 mg (10/04/2019), 6 mg (10/18/2019) cyclophosphamide (CYTOXAN) 1,100 mg in sodium chloride 0.9 % 250 mL chemo infusion, 600 mg/m2 = 1,100 mg, Intravenous,  Once, 4 of 4 cycles Dose modification: 500 mg/m2 (original dose 600 mg/m2, Cycle 2, Reason: Provider Judgment) Administration: 1,100 mg (09/04/2019), 920 mg (09/18/2019), 920 mg (10/02/2019), 920 mg (10/16/2019) PACLitaxel (TAXOL) 144 mg in sodium chloride 0.9 % 250 mL chemo infusion (</= 62m/m2), 80 mg/m2 = 144 mg, Intravenous,  Once, 4 of 12 cycles Dose modification: 65 mg/m2 (original dose 80 mg/m2, Cycle 6, Reason: Dose not tolerated), 50 mg/m2 (original dose 80 mg/m2, Cycle 7, Reason: Provider Judgment) Administration: 144 mg (10/30/2019), 120 mg (11/06/2019), 90 mg (11/13/2019) fosaprepitant (EMEND) 150 mg in sodium chloride 0.9 % 145 mL IVPB, 150 mg, Intravenous,  Once, 4 of 4 cycles Administration: 150 mg (09/04/2019), 150 mg (09/18/2019), 150 mg (10/02/2019), 150 mg (10/16/2019)  for chemotherapy treatment.      CHIEF COMPLIANT: Cycle 4 Taxol  INTERVAL  HISTORY: Gina Ross is a 49 y.o. with above-mentioned history of right breast cancer currently on neoadjuvant chemotherapy withweekly  Taxol after completing four cycles ofdose dense Adriamycin and Cytoxan.She presents to the clinic todayfora toxicity checkand cycle4.    ALLERGIES:  is allergic to penicillins.  MEDICATIONS:  Current Outpatient Medications  Medication Sig Dispense Refill  . acetaminophen (TYLENOL) 325 MG tablet Take 650 mg by mouth every 6 (six) hours as needed.    Marland Kitchen ibuprofen (ADVIL) 800 MG tablet Take 1 tablet (800 mg total) by mouth 3 (three) times daily. 21 tablet 0  . lidocaine-prilocaine (EMLA) cream Apply to affected area once 30 g 3  . LORazepam (ATIVAN) 0.5 MG tablet Take 1 tablet (0.5 mg total) by mouth at bedtime as needed (Nausea or vomiting). 30 tablet 1  . ondansetron (ZOFRAN) 8 MG tablet Take 1 tablet (8 mg total) by mouth 2 (two) times daily as needed. Start on the third day after chemotherapy. (Patient not taking: Reported on 10/16/2019) 30 tablet 1  . prochlorperazine (COMPAZINE) 10 MG tablet Take 1 tablet (10 mg total) by mouth every 6 (six) hours as needed (Nausea or vomiting). (Patient not taking: Reported on 10/16/2019) 30 tablet 1  . traMADol (ULTRAM) 50 MG tablet Take 1 tablet (50 mg total) by mouth every 6 (six) hours as needed. 12 tablet 0   No current facility-administered medications for this visit.   Facility-Administered Medications Ordered in Other Visits  Medication Dose Route Frequency Provider Last Rate Last Admin  . 0.9 %  sodium chloride infusion   Intravenous Once [name], MD      . dexamethasone (DECADRON) 10 mg in sodium chloride 0.9 % 50 mL IVPB  10 mg Intravenous Once [name], MD      . diphenhydrAMINE (BENADRYL) injection 25 mg  25 mg Intravenous Once [name], MD      . famotidine (PEPCID) IVPB 20 mg premix  20 mg Intravenous Once [name], MD      . heparin lock flush 100 unit/mL  500 Units Intracatheter Once PRN [name], MD      . PACLitaxel (TAXOL) 90 mg in sodium chloride 0.9 % 250 mL chemo infusion (</= 42m/m2)  50 mg/m2  (Treatment Plan Recorded) Intravenous Once GNicholas Lose MD      . sodium chloride flush (NS) 0.9 % injection 10 mL  10 mL Intracatheter PRN GNicholas Lose MD        PHYSICAL EXAMINATION: ECOG PERFORMANCE STATUS: 1 - Symptomatic but completely ambulatory  Vitals:   11/20/19 1124  BP: (!) 154/100  Pulse: 85  Resp: 20  Temp: 97.9 F (36.6 C)  SpO2: 100%   Filed Weights   11/20/19 1124  Weight: 154 lb 9.6 oz (70.1 kg)    LABORATORY DATA:  I have reviewed the data as listed CMP Latest Ref Rng & Units 11/20/2019 11/13/2019 11/06/2019  Glucose 70 - 99 mg/dL 87 98 90  BUN 6 - 20 mg/dL 8 7 10   Creatinine 0.44 - 1.00 mg/dL 0.72 0.89 0.68  Sodium 135 - 145 mmol/L 139 141 140  Potassium 3.5 - 5.1 mmol/L 4.1 3.8 3.9  Chloride 98 - 111 mmol/L 106 105 107  CO2 22 - 32 mmol/L 27 26 25   Calcium 8.9 - 10.3 mg/dL 9.3 9.0 8.6(L)  Total Protein 6.5 - 8.1 g/dL 6.8 6.5 6.5  Total Bilirubin 0.3 - 1.2 mg/dL 0.3 0.2(L) <0.2(L)  Alkaline Phos 38 - 126 U/L 51 53 63  AST 15 - 41 U/L 74(H) 53(H) 22  ALT 0 - 44 U/L 82(H) 61(H) 22    Lab Results  Component Value Date   WBC 3.2 (L) 11/20/2019   HGB 10.4 (L) 11/20/2019   HCT 30.8 (L) 11/20/2019   MCV 93.3 11/20/2019   PLT 272 11/20/2019   NEUTROABS 1.4 (L) 11/20/2019    ASSESSMENT & PLAN:  Malignant neoplasm of overlapping sites of right breast in female, estrogen receptor positive ([place]) 08/17/19:Patient palpated a right breast mass and right axilla mass and noted skin redness x2 weeks. She went to the ED and had an abscess drained with no improvement. Diagnostic mammogram and US showed a 7.3cm right breast mass extending into all 4 quadrants and partially into overlying skin with 12 abnormal right axillary lymph nodes. 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  Treatment plan: 1. Neoadjuvant chemotherapy with Adriamycin and Cytoxan dose dense 4  followed byTaxolweekly 12 2. Followed by breastmastectomy and axillary lymph node dissection 3. Followed by adjuvant radiation therapy 4.Follow-up adjuvant antiestrogen therapy  CT CAP 08/31/2019: Very large central right breast mass, bulky right axillary and subpectoral lymph nodes. No evidence of distant metastatic disease. Bulky uterine fibroids Bone scan 08/31/2019: No metastatic disease in the bones ---------------------------------------------------------------------------------------------------------------------------------------------------- Current treatment:Cycle4 Taxol Echo: 08/29/2019: EF60-65%   Chemo toxicities: Chemotherapy-induced anemia: Hemoglobin today is  10.4 ANC 1.4: She is receiving a reduced dosage of Taxol off 65 mg/m. Elevated AST and ALT: Currently being monitored.  If it continues to go up we may have to reduce the dosage. Denies any peripheral neuropathy.  Return to clinic weekly for Taxol and follow-up with me every 2 weeks    No orders of the defined types were placed in this encounter.  The patient has a good understanding of the overall plan. she agrees with it. she will call with any problems that may develop before the next visit here.  Total time spent: 30 mins including face to face time and time spent for planning, charting and coordination of care  [name], MD 11/20/2019  I, Cloyde Reams Dorshimer, am acting as scribe for Dr. [name].  I have reviewed the above documentation for accuracy and completeness, and I agree with the above.

## 2019-11-20 ENCOUNTER — Inpatient Hospital Stay: Payer: Medicaid Other

## 2019-11-20 ENCOUNTER — Inpatient Hospital Stay (HOSPITAL_BASED_OUTPATIENT_CLINIC_OR_DEPARTMENT_OTHER): Payer: Medicaid Other | Admitting: Hematology and Oncology

## 2019-11-20 ENCOUNTER — Other Ambulatory Visit: Payer: Self-pay

## 2019-11-20 VITALS — BP 154/99

## 2019-11-20 DIAGNOSIS — C50811 Malignant neoplasm of overlapping sites of right female breast: Secondary | ICD-10-CM

## 2019-11-20 DIAGNOSIS — Z5111 Encounter for antineoplastic chemotherapy: Secondary | ICD-10-CM | POA: Diagnosis not present

## 2019-11-20 DIAGNOSIS — Z170 Estrogen receptor positive status [ER+]: Secondary | ICD-10-CM

## 2019-11-20 LAB — CMP (CANCER CENTER ONLY)
ALT: 82 U/L — ABNORMAL HIGH (ref 0–44)
AST: 74 U/L — ABNORMAL HIGH (ref 15–41)
Albumin: 3.6 g/dL (ref 3.5–5.0)
Alkaline Phosphatase: 51 U/L (ref 38–126)
Anion gap: 6 (ref 5–15)
BUN: 8 mg/dL (ref 6–20)
CO2: 27 mmol/L (ref 22–32)
Calcium: 9.3 mg/dL (ref 8.9–10.3)
Chloride: 106 mmol/L (ref 98–111)
Creatinine: 0.72 mg/dL (ref 0.44–1.00)
GFR, Est AFR Am: >60 mL/min (ref >60)
GFR, Estimated: >60 mL/min (ref >60)
Glucose, Bld: 87 mg/dL (ref 70–99)
Potassium: 4.1 mmol/L (ref 3.5–5.1)
Sodium: 139 mmol/L (ref 135–145)
Total Bilirubin: 0.3 mg/dL (ref 0.3–1.2)
Total Protein: 6.8 g/dL (ref 6.5–8.1)

## 2019-11-20 LAB — CBC WITH DIFFERENTIAL (CANCER CENTER ONLY)
Abs Immature Granulocytes: 0.01 10*3/uL (ref 0.00–0.07)
Basophils Absolute: 0 10*3/uL (ref 0.0–0.1)
Basophils Relative: 1 %
Eosinophils Absolute: 0.2 10*3/uL (ref 0.0–0.5)
Eosinophils Relative: 7 %
HCT: 30.8 % — ABNORMAL LOW (ref 36.0–46.0)
Hemoglobin: 10.4 g/dL — ABNORMAL LOW (ref 12.0–15.0)
Immature Granulocytes: 0 %
Lymphocytes Relative: 33 %
Lymphs Abs: 1.1 10*3/uL (ref 0.7–4.0)
MCH: 31.5 pg (ref 26.0–34.0)
MCHC: 33.8 g/dL (ref 30.0–36.0)
MCV: 93.3 fL (ref 80.0–100.0)
Monocytes Absolute: 0.5 10*3/uL (ref 0.1–1.0)
Monocytes Relative: 16 %
Neutro Abs: 1.4 10*3/uL — ABNORMAL LOW (ref 1.7–7.7)
Neutrophils Relative %: 43 %
Platelet Count: 272 10*3/uL (ref 150–400)
RBC: 3.3 MIL/uL — ABNORMAL LOW (ref 3.87–5.11)
RDW: 16.8 % — ABNORMAL HIGH (ref 11.5–15.5)
WBC Count: 3.2 10*3/uL — ABNORMAL LOW (ref 4.0–10.5)
nRBC: 0 % (ref 0.0–0.2)

## 2019-11-20 MED ORDER — DIPHENHYDRAMINE HCL 50 MG/ML IJ SOLN
25.0000 mg | Freq: Once | INTRAMUSCULAR | Status: AC
  Administered 2019-11-20: 25 mg via INTRAVENOUS

## 2019-11-20 MED ORDER — DIPHENHYDRAMINE HCL 50 MG/ML IJ SOLN
INTRAMUSCULAR | Status: AC
  Filled 2019-11-20: qty 1

## 2019-11-20 MED ORDER — FAMOTIDINE IN NACL 20-0.9 MG/50ML-% IV SOLN
INTRAVENOUS | Status: AC
  Filled 2019-11-20: qty 50

## 2019-11-20 MED ORDER — SODIUM CHLORIDE 0.9% FLUSH
10.0000 mL | INTRAVENOUS | Status: DC | PRN
  Administered 2019-11-20: 10 mL
  Filled 2019-11-20: qty 10

## 2019-11-20 MED ORDER — SODIUM CHLORIDE 0.9 % IV SOLN
10.0000 mg | Freq: Once | INTRAVENOUS | Status: AC
  Administered 2019-11-20: 10 mg via INTRAVENOUS
  Filled 2019-11-20: qty 10

## 2019-11-20 MED ORDER — SODIUM CHLORIDE 0.9 % IV SOLN
Freq: Once | INTRAVENOUS | Status: AC
  Filled 2019-11-20: qty 250

## 2019-11-20 MED ORDER — HEPARIN SOD (PORK) LOCK FLUSH 100 UNIT/ML IV SOLN
500.0000 [IU] | Freq: Once | INTRAVENOUS | Status: AC | PRN
  Administered 2019-11-20: 500 [IU]
  Filled 2019-11-20: qty 5

## 2019-11-20 MED ORDER — FAMOTIDINE IN NACL 20-0.9 MG/50ML-% IV SOLN
20.0000 mg | Freq: Once | INTRAVENOUS | Status: AC
  Administered 2019-11-20: 20 mg via INTRAVENOUS

## 2019-11-20 MED ORDER — SODIUM CHLORIDE 0.9 % IV SOLN
50.0000 mg/m2 | Freq: Once | INTRAVENOUS | Status: AC
  Administered 2019-11-20: 90 mg via INTRAVENOUS
  Filled 2019-11-20: qty 15

## 2019-11-20 NOTE — Progress Notes (Signed)
Per Dr. Lindi Adie, ok to treat with ALT of 82.

## 2019-11-20 NOTE — Patient Instructions (Signed)
[facility] Discharge Instructions for Patients Receiving Chemotherapy  Today you received the following chemotherapy agents:  Taxol.  To help prevent nausea and vomiting after your treatment, we encourage you to take your nausea medication as directed.   If you develop nausea and vomiting that is not controlled by your nausea medication, call the clinic.   BELOW ARE SYMPTOMS THAT SHOULD BE REPORTED IMMEDIATELY:  *FEVER GREATER THAN 100.5 F  *CHILLS WITH OR WITHOUT FEVER  NAUSEA AND VOMITING THAT IS NOT CONTROLLED WITH YOUR NAUSEA MEDICATION  *UNUSUAL SHORTNESS OF BREATH  *UNUSUAL BRUISING OR BLEEDING  TENDERNESS IN MOUTH AND THROAT WITH OR WITHOUT PRESENCE OF ULCERS  *URINARY PROBLEMS  *BOWEL PROBLEMS  UNUSUAL RASH Items with * indicate a potential emergency and should be followed up as soon as possible.  Feel free to call the clinic should you have any questions or concerns. The clinic phone number is [phone].  Please show the CHEMO ALERT CARD at check-in to the Emergency Department and triage nurse.   

## 2019-11-20 NOTE — Progress Notes (Signed)
Pharmacist Chemotherapy Monitoring - Follow Up Assessment    I verify that I have reviewed each item in the below checklist:  . Regimen for the patient is scheduled for the appropriate day and plan matches scheduled date. Marland Kitchen Appropriate non-routine labs are ordered dependent on drug ordered. . If applicable, additional medications reviewed and ordered per protocol based on lifetime cumulative doses and/or treatment regimen.   Plan for follow-up and/or issues identified: No . I-vent associated with next due treatment: No . MD and/or nursing notified: No  Tiffanie Blassingame K 11/20/2019 3:23 PM

## 2019-11-20 NOTE — Assessment & Plan Note (Signed)
08/17/19:Patient palpated a right breast mass and right axilla mass and noted skin redness x2 weeks. She went to the ED and had an abscess drained with no improvement. Diagnostic mammogram and US showed a 7.3cm right breast mass extending into all 4 quadrants and partially into overlying skin with 12 abnormal right axillary lymph nodes. 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  Treatment plan: 1. Neoadjuvant chemotherapy with Adriamycin and Cytoxan dose dense 4 followed byTaxolweekly 12 2. Followed by breastmastectomy and axillary lymph node dissection 3. Followed by adjuvant radiation therapy 4.Follow-up adjuvant antiestrogen therapy  CT CAP 08/31/2019: Very large central right breast mass, bulky right axillary and subpectoral lymph nodes. No evidence of distant metastatic disease. Bulky uterine fibroids Bone scan 08/31/2019: No metastatic disease in the bones ---------------------------------------------------------------------------------------------------------------------------------------------------- Current treatment:Cycle4 Taxol Echo: 08/29/2019: EF60-65%   Chemo toxicities: Chemotherapy-induced anemia: Hemoglobin today is 9.8 ANC 1.3: We will reduce the dosage of Taxol to 65 mg/m.  Return to clinic weekly for Taxol and follow-up with me every 2 weeks

## 2019-11-23 ENCOUNTER — Encounter: Payer: Self-pay | Admitting: General Practice

## 2019-11-23 ENCOUNTER — Encounter: Payer: Self-pay | Admitting: Licensed Clinical Social Worker

## 2019-11-23 NOTE — Progress Notes (Signed)
[place] CSW Progress Note  Clinical Education officer, museum received phone call from patient asking about paying her phone bill through the [employer]. CSW informed that the financial advocate is the correct contact and sent message to [employer] for patient.   CSW offered to have [employer] cards and bag of food available today for patient as she is waiting on her unemployment check. Pt stated she will come to support services to pick up today. CSW also sent her the application for a grant through [employer].    Edwinna Areola Stoisits , LCSW

## 2019-11-23 NOTE — Progress Notes (Signed)
[place] CSW Progress Notes  Food bag and [employer] provided at request of CSW Stoisits.  Edwyna Shell, LCSW Clinical Social Worker Phone:  [phone] Cell:  [phone]

## 2019-11-27 ENCOUNTER — Other Ambulatory Visit: Payer: Self-pay

## 2019-11-27 ENCOUNTER — Inpatient Hospital Stay: Payer: Medicaid Other

## 2019-11-27 ENCOUNTER — Inpatient Hospital Stay: Payer: Medicaid Other | Attending: Hematology and Oncology

## 2019-11-27 VITALS — BP 135/95 | HR 91 | Temp 98.7°F | Resp 17 | Ht 69.0 in | Wt 151.1 lb

## 2019-11-27 DIAGNOSIS — Z5111 Encounter for antineoplastic chemotherapy: Secondary | ICD-10-CM | POA: Diagnosis not present

## 2019-11-27 DIAGNOSIS — C50811 Malignant neoplasm of overlapping sites of right female breast: Secondary | ICD-10-CM | POA: Insufficient documentation

## 2019-11-27 DIAGNOSIS — G620 Drug-induced polyneuropathy: Secondary | ICD-10-CM | POA: Diagnosis not present

## 2019-11-27 DIAGNOSIS — Z95828 Presence of other vascular implants and grafts: Secondary | ICD-10-CM

## 2019-11-27 DIAGNOSIS — Z170 Estrogen receptor positive status [ER+]: Secondary | ICD-10-CM | POA: Diagnosis not present

## 2019-11-27 LAB — CBC WITH DIFFERENTIAL (CANCER CENTER ONLY)
Abs Immature Granulocytes: 0.01 10*3/uL (ref 0.00–0.07)
Basophils Absolute: 0 10*3/uL (ref 0.0–0.1)
Basophils Relative: 1 %
Eosinophils Absolute: 0.2 10*3/uL (ref 0.0–0.5)
Eosinophils Relative: 5 %
HCT: 33.1 % — ABNORMAL LOW (ref 36.0–46.0)
Hemoglobin: 11.1 g/dL — ABNORMAL LOW (ref 12.0–15.0)
Immature Granulocytes: 0 %
Lymphocytes Relative: 37 %
Lymphs Abs: 1.3 10*3/uL (ref 0.7–4.0)
MCH: 32.3 pg (ref 26.0–34.0)
MCHC: 33.5 g/dL (ref 30.0–36.0)
MCV: 96.2 fL (ref 80.0–100.0)
Monocytes Absolute: 0.5 10*3/uL (ref 0.1–1.0)
Monocytes Relative: 14 %
Neutro Abs: 1.5 10*3/uL — ABNORMAL LOW (ref 1.7–7.7)
Neutrophils Relative %: 43 %
Platelet Count: 278 10*3/uL (ref 150–400)
RBC: 3.44 MIL/uL — ABNORMAL LOW (ref 3.87–5.11)
RDW: 16.1 % — ABNORMAL HIGH (ref 11.5–15.5)
WBC Count: 3.5 10*3/uL — ABNORMAL LOW (ref 4.0–10.5)
nRBC: 0 % (ref 0.0–0.2)

## 2019-11-27 LAB — CMP (CANCER CENTER ONLY)
ALT: 33 U/L (ref 0–44)
AST: 22 U/L (ref 15–41)
Albumin: 3.8 g/dL (ref 3.5–5.0)
Alkaline Phosphatase: 70 U/L (ref 38–126)
Anion gap: 11 (ref 5–15)
BUN: 11 mg/dL (ref 6–20)
CO2: 22 mmol/L (ref 22–32)
Calcium: 9 mg/dL (ref 8.9–10.3)
Chloride: 107 mmol/L (ref 98–111)
Creatinine: 0.89 mg/dL (ref 0.44–1.00)
GFR, Est AFR Am: >60 mL/min (ref >60)
GFR, Estimated: >60 mL/min (ref >60)
Glucose, Bld: 108 mg/dL — ABNORMAL HIGH (ref 70–99)
Potassium: 3.9 mmol/L (ref 3.5–5.1)
Sodium: 140 mmol/L (ref 135–145)
Total Bilirubin: <0.2 mg/dL — ABNORMAL LOW (ref 0.3–1.2)
Total Protein: 6.7 g/dL (ref 6.5–8.1)

## 2019-11-27 MED ORDER — SODIUM CHLORIDE 0.9% FLUSH
10.0000 mL | INTRAVENOUS | Status: DC | PRN
  Administered 2019-11-27: 10 mL
  Filled 2019-11-27: qty 10

## 2019-11-27 MED ORDER — DIPHENHYDRAMINE HCL 50 MG/ML IJ SOLN
INTRAMUSCULAR | Status: AC
  Filled 2019-11-27: qty 1

## 2019-11-27 MED ORDER — SODIUM CHLORIDE 0.9 % IV SOLN
Freq: Once | INTRAVENOUS | Status: AC
  Filled 2019-11-27: qty 250

## 2019-11-27 MED ORDER — DIPHENHYDRAMINE HCL 50 MG/ML IJ SOLN
25.0000 mg | Freq: Once | INTRAMUSCULAR | Status: AC
  Administered 2019-11-27: 25 mg via INTRAVENOUS

## 2019-11-27 MED ORDER — FAMOTIDINE IN NACL 20-0.9 MG/50ML-% IV SOLN
20.0000 mg | Freq: Once | INTRAVENOUS | Status: AC
  Administered 2019-11-27: 20 mg via INTRAVENOUS

## 2019-11-27 MED ORDER — FAMOTIDINE IN NACL 20-0.9 MG/50ML-% IV SOLN
INTRAVENOUS | Status: AC
  Filled 2019-11-27: qty 50

## 2019-11-27 MED ORDER — SODIUM CHLORIDE 0.9 % IV SOLN
50.0000 mg/m2 | Freq: Once | INTRAVENOUS | Status: AC
  Administered 2019-11-27: 90 mg via INTRAVENOUS
  Filled 2019-11-27: qty 15

## 2019-11-27 MED ORDER — SODIUM CHLORIDE 0.9 % IV SOLN
10.0000 mg | Freq: Once | INTRAVENOUS | Status: AC
  Administered 2019-11-27: 10 mg via INTRAVENOUS
  Filled 2019-11-27: qty 10

## 2019-11-27 MED ORDER — HEPARIN SOD (PORK) LOCK FLUSH 100 UNIT/ML IV SOLN
500.0000 [IU] | Freq: Once | INTRAVENOUS | Status: AC | PRN
  Administered 2019-11-27: 500 [IU]
  Filled 2019-11-27: qty 5

## 2019-11-27 NOTE — Patient Instructions (Signed)
[facility] Discharge Instructions for Patients Receiving Chemotherapy  Today you received the following chemotherapy agents taxol  To help prevent nausea and vomiting after your treatment, we encourage you to take your nausea medication as directed   If you develop nausea and vomiting that is not controlled by your nausea medication, call the clinic.   BELOW ARE SYMPTOMS THAT SHOULD BE REPORTED IMMEDIATELY:  *FEVER GREATER THAN 100.5 F  *CHILLS WITH OR WITHOUT FEVER  NAUSEA AND VOMITING THAT IS NOT CONTROLLED WITH YOUR NAUSEA MEDICATION  *UNUSUAL SHORTNESS OF BREATH  *UNUSUAL BRUISING OR BLEEDING  TENDERNESS IN MOUTH AND THROAT WITH OR WITHOUT PRESENCE OF ULCERS  *URINARY PROBLEMS  *BOWEL PROBLEMS  UNUSUAL RASH Items with * indicate a potential emergency and should be followed up as soon as possible.  Feel free to call the clinic should you have any questions or concerns. The clinic phone number is [phone].  Please show the CHEMO ALERT CARD at check-in to the Emergency Department and triage nurse.   

## 2019-11-30 ENCOUNTER — Encounter: Payer: Self-pay | Admitting: Licensed Clinical Social Worker

## 2019-11-30 NOTE — Progress Notes (Signed)
[place] CSW Progress Note  Clinical Education officer, museum received call from patient regarding stress with bills. Patient will be coming in today to meet with financial advocate for assistance with water bill. Patient is very stressed as her unemployment checks are delayed from when she recertified at the beginning of May. Repeatedly stated that she "is so close to finishing" treatment but is just so stressed right now because of bills. Her old phone broke so she has not been able to complete the [facility]. CSW will give paper copy of that application as well as [place] [employer] [place] emergency assistance for rent & utilities application, last [employer], and food bag today.   Patient thankful for assistance and for space to let her feelings out. CSW provided empathetic listening and supportive counsel.    Edwinna Areola Manroop Jakubowicz , LCSW

## 2019-12-03 NOTE — Progress Notes (Signed)
Patient Care Team: Patient, [name] as PCP - General (General Practice) [name], RN as Oncology Nurse Navigator [name], RN as Oncology Nurse Navigator [name], MD as Consulting Physician (General Surgery) [name], MD as Consulting Physician (Hematology and Oncology) [name], MD as Consulting Physician (Radiation Oncology)  DIAGNOSIS:    ICD-10-CM   1. Malignant neoplasm of overlapping sites of right breast in female, estrogen receptor positive ([place])  C50.811    Z17.0     SUMMARY OF ONCOLOGIC HISTORY: Oncology History  Malignant neoplasm of overlapping sites of right breast in female, estrogen receptor positive ([place])  08/17/2019 Initial Diagnosis   Patient palpated a right breast mass and right axilla mass and noted skin redness x2 weeks. She went to the ED and had an abscess drained with no improvement. Diagnostic mammogram and US showed a 7.3cm right breast mass extending into all 4 quadrants and partially into overlying skin with 12 abnormal right axillary lymph nodes. Biopsy  showed IDC with necrosis in the breast and axilla, grade 3, HER-2 equivocal by IHC, negative by FISH, ER+ 40% weak, PR -, Ki67 90%.    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 The report date is 09/03/2019.  The Common Hereditary Cancers Panel offered by [facility] includes sequencing and/or deletion duplication testing of the following 48 genes: APC, ATM, AXIN2, BARD1, BMPR1A, BRCA1, BRCA2, BRIP1, CDH1, CDK4, CDKN2A (p14ARF), CDKN2A (p16INK4a), CHEK2, CTNNA1, DICER1, EPCAM (Deletion/duplication testing only), GREM1 (promoter region deletion/duplication testing only), KIT, MEN1, MLH1, MSH2, MSH3, MSH6, MUTYH, NBN, NF1, NHTL1, PALB2, PDGFRA, PMS2, POLD1, POLE, PTEN, RAD50, RAD51C, RAD51D, RNF43, SDHB, SDHC, SDHD, SMAD4, SMARCA4. STK11, TP53,  TSC1, TSC2, and VHL.  The following genes were evaluated for sequence changes only: SDHA and HOXB13 c.251G>A variant only.    09/04/2019 -  Chemotherapy   The patient had DOXOrubicin (ADRIAMYCIN) chemo injection 110 mg, 60 mg/m2 = 110 mg, Intravenous,  Once, 4 of 4 cycles Dose modification: 50 mg/m2 (original dose 60 mg/m2, Cycle 2, Reason: Provider Judgment) Administration: 110 mg (09/04/2019), 92 mg (09/18/2019), 92 mg (10/02/2019), 92 mg (10/16/2019) palonosetron (ALOXI) injection 0.25 mg, 0.25 mg, Intravenous,  Once, 4 of 4 cycles Administration: 0.25 mg (09/04/2019), 0.25 mg (09/18/2019), 0.25 mg (10/02/2019), 0.25 mg (10/16/2019) pegfilgrastim-cbqv (UDENYCA) injection 6 mg, 6 mg, Subcutaneous, Once, 4 of 4 cycles Administration: 6 mg (09/06/2019), 6 mg (09/20/2019), 6 mg (10/04/2019), 6 mg (10/18/2019) cyclophosphamide (CYTOXAN) 1,100 mg in sodium chloride 0.9 % 250 mL chemo infusion, 600 mg/m2 = 1,100 mg, Intravenous,  Once, 4 of 4 cycles Dose modification: 500 mg/m2 (original dose 600 mg/m2, Cycle 2, Reason: Provider Judgment) Administration: 1,100 mg (09/04/2019), 920 mg (09/18/2019), 920 mg (10/02/2019), 920 mg (10/16/2019) PACLitaxel (TAXOL) 144 mg in sodium chloride 0.9 % 250 mL chemo infusion (</= 8m/m2), 80 mg/m2 = 144 mg, Intravenous,  Once, 5 of 12 cycles Dose modification: 65 mg/m2 (original dose 80 mg/m2, Cycle 6, Reason: Dose not tolerated), 50 mg/m2 (original dose 80 mg/m2, Cycle 7, Reason: Provider Judgment) Administration: 144 mg (10/30/2019), 120 mg (11/06/2019), 90 mg (11/13/2019), 90 mg (11/20/2019), 90 mg (11/27/2019) fosaprepitant (EMEND) 150 mg in sodium chloride 0.9 % 145 mL IVPB, 150 mg, Intravenous,  Once, 4 of 4 cycles Administration: 150 mg (09/04/2019), 150 mg (09/18/2019), 150 mg (10/02/2019), 150 mg (10/16/2019)  for chemotherapy treatment.      CHIEF  COMPLIANT: Cycle6Taxol  INTERVAL HISTORY: HALLI EQUIHUA is a 49 y.o. with above-mentioned history of right breast cancer currently on  neoadjuvant chemotherapy withweekly Taxol after completing four cycles ofdose dense Adriamycin and Cytoxan.She presents to the clinic todayfora toxicity checkand cycle6.  She has noticed mild tingling of the fingers intermittently.  Does not have any numbness or pain.  Fatigue is present.  ALLERGIES:  is allergic to penicillins.  MEDICATIONS:  Current Outpatient Medications  Medication Sig Dispense Refill  . acetaminophen (TYLENOL) 325 MG tablet Take 650 mg by mouth every 6 (six) hours as needed.    Marland Kitchen ibuprofen (ADVIL) 800 MG tablet Take 1 tablet (800 mg total) by mouth 3 (three) times daily. 21 tablet 0  . lidocaine-prilocaine (EMLA) cream Apply to affected area once 30 g 3  . LORazepam (ATIVAN) 0.5 MG tablet Take 1 tablet (0.5 mg total) by mouth at bedtime as needed (Nausea or vomiting). 30 tablet 1  . ondansetron (ZOFRAN) 8 MG tablet Take 1 tablet (8 mg total) by mouth 2 (two) times daily as needed. Start on the third day after chemotherapy. (Patient not taking: Reported on 10/16/2019) 30 tablet 1  . prochlorperazine (COMPAZINE) 10 MG tablet Take 1 tablet (10 mg total) by mouth every 6 (six) hours as needed (Nausea or vomiting). (Patient not taking: Reported on 10/16/2019) 30 tablet 1  . traMADol (ULTRAM) 50 MG tablet Take 1 tablet (50 mg total) by mouth every 6 (six) hours as needed. 12 tablet 0   No current facility-administered medications for this visit.    PHYSICAL EXAMINATION: ECOG PERFORMANCE STATUS: 1 - Symptomatic but completely ambulatory  Vitals:   12/04/19 1118  BP: (!) 151/99  Pulse: 88  Resp: 20  Temp: 98.2 F (36.8 C)  SpO2: 100%   Filed Weights   12/04/19 1118  Weight: 153 lb 11.2 oz (69.7 kg)    LABORATORY DATA:  I have reviewed the data as listed CMP Latest Ref Rng & Units 12/04/2019 11/27/2019 11/20/2019  Glucose 70 - 99 mg/dL 99 108(H) 87  BUN 6 - 20 mg/dL 10 11 8   Creatinine 0.44 - 1.00 mg/dL 0.84 0.89 0.72  Sodium 135 - 145 mmol/L 140 140 139    Potassium 3.5 - 5.1 mmol/L 3.9 3.9 4.1  Chloride 98 - 111 mmol/L 106 107 106  CO2 22 - 32 mmol/L 24 22 27   Calcium 8.9 - 10.3 mg/dL 9.3 9.0 9.3  Total Protein 6.5 - 8.1 g/dL 6.5 6.7 6.8  Total Bilirubin 0.3 - 1.2 mg/dL 0.3 <0.2(L) 0.3  Alkaline Phos 38 - 126 U/L 62 70 51  AST 15 - 41 U/L 22 22 74(H)  ALT 0 - 44 U/L 21 33 82(H)    Lab Results  Component Value Date   WBC 3.1 (L) 12/04/2019   HGB 10.9 (L) 12/04/2019   HCT 32.0 (L) 12/04/2019   MCV 95.5 12/04/2019   PLT 246 12/04/2019   NEUTROABS 1.4 (L) 12/04/2019    ASSESSMENT & PLAN:  Malignant neoplasm of overlapping sites of right breast in female, estrogen receptor positive ([place]) 08/17/19:Patient palpated a right breast mass and right axilla mass and noted skin redness x2 weeks. She went to the ED and had an abscess drained with no improvement. Diagnostic mammogram and US showed a 7.3cm right breast mass extending into all 4 quadrants and partially into overlying skin with 12 abnormal right axillary lymph nodes. Biopsy showed IDC with necrosis in the breast and axilla, grade 3, HER-2 equivocal by  IHC, negative by FISH, ER+ 40% weak, PR -, Ki67 90%.  Treatment plan: 1. Neoadjuvant chemotherapy with Adriamycin and Cytoxan dose dense 4 followed byTaxolweekly 12 2. Followed by breastmastectomy and axillary lymph node dissection 3. Followed by adjuvant radiation therapy 4.Follow-up adjuvant antiestrogen therapy  CT CAP 08/31/2019: Very large central right breast mass, bulky right axillary and subpectoral lymph nodes. No evidence of distant metastatic disease. Bulky uterine fibroids Bone scan 08/31/2019: No metastatic disease in the bones ---------------------------------------------------------------------------------------------------------------------------------------------------- Current treatment:Cycle6Taxol Echo: 08/29/2019: EF60-65%   Chemo toxicities: Chemotherapy-induced anemia: Hemoglobin today is 10.4 ANC  1.4: She is receiving a reduced dosage of Taxol off 65 mg/m. Elevated AST and ALT: Currently being monitored.  If it continues to go up we may have to reduce the dosage. Denies any peripheral neuropathy.  Return to clinicweekly for Taxol and follow-up with me every 2 weeks     No orders of the defined types were placed in this encounter.  The patient has a good understanding of the overall plan. she agrees with it. she will call with any problems that may develop before the next visit here.  Total time spent: 30 mins including face to face time and time spent for planning, charting and coordination of care  [name], MD 12/04/2019  I, Cloyde Reams Dorshimer, am acting as scribe for Dr. [name].  I have reviewed the above documentation for accuracy and completeness, and I agree with the above.

## 2019-12-04 ENCOUNTER — Inpatient Hospital Stay: Payer: Medicaid Other

## 2019-12-04 ENCOUNTER — Inpatient Hospital Stay (HOSPITAL_BASED_OUTPATIENT_CLINIC_OR_DEPARTMENT_OTHER): Payer: Medicaid Other | Admitting: Hematology and Oncology

## 2019-12-04 ENCOUNTER — Other Ambulatory Visit: Payer: Self-pay

## 2019-12-04 ENCOUNTER — Encounter: Payer: Self-pay | Admitting: *Deleted

## 2019-12-04 DIAGNOSIS — C50811 Malignant neoplasm of overlapping sites of right female breast: Secondary | ICD-10-CM | POA: Diagnosis not present

## 2019-12-04 DIAGNOSIS — Z170 Estrogen receptor positive status [ER+]: Secondary | ICD-10-CM | POA: Diagnosis not present

## 2019-12-04 DIAGNOSIS — Z95828 Presence of other vascular implants and grafts: Secondary | ICD-10-CM

## 2019-12-04 DIAGNOSIS — Z5111 Encounter for antineoplastic chemotherapy: Secondary | ICD-10-CM | POA: Diagnosis not present

## 2019-12-04 LAB — CBC WITH DIFFERENTIAL (CANCER CENTER ONLY)
Abs Immature Granulocytes: 0.01 10*3/uL (ref 0.00–0.07)
Basophils Absolute: 0 10*3/uL (ref 0.0–0.1)
Basophils Relative: 1 %
Eosinophils Absolute: 0.2 10*3/uL (ref 0.0–0.5)
Eosinophils Relative: 5 %
HCT: 32 % — ABNORMAL LOW (ref 36.0–46.0)
Hemoglobin: 10.9 g/dL — ABNORMAL LOW (ref 12.0–15.0)
Immature Granulocytes: 0 %
Lymphocytes Relative: 35 %
Lymphs Abs: 1.1 10*3/uL (ref 0.7–4.0)
MCH: 32.5 pg (ref 26.0–34.0)
MCHC: 34.1 g/dL (ref 30.0–36.0)
MCV: 95.5 fL (ref 80.0–100.0)
Monocytes Absolute: 0.4 10*3/uL (ref 0.1–1.0)
Monocytes Relative: 13 %
Neutro Abs: 1.4 10*3/uL — ABNORMAL LOW (ref 1.7–7.7)
Neutrophils Relative %: 46 %
Platelet Count: 246 10*3/uL (ref 150–400)
RBC: 3.35 MIL/uL — ABNORMAL LOW (ref 3.87–5.11)
RDW: 15.4 % (ref 11.5–15.5)
WBC Count: 3.1 10*3/uL — ABNORMAL LOW (ref 4.0–10.5)
nRBC: 0 % (ref 0.0–0.2)

## 2019-12-04 LAB — CMP (CANCER CENTER ONLY)
ALT: 21 U/L (ref 0–44)
AST: 22 U/L (ref 15–41)
Albumin: 3.7 g/dL (ref 3.5–5.0)
Alkaline Phosphatase: 62 U/L (ref 38–126)
Anion gap: 10 (ref 5–15)
BUN: 10 mg/dL (ref 6–20)
CO2: 24 mmol/L (ref 22–32)
Calcium: 9.3 mg/dL (ref 8.9–10.3)
Chloride: 106 mmol/L (ref 98–111)
Creatinine: 0.84 mg/dL (ref 0.44–1.00)
GFR, Est AFR Am: >60 mL/min (ref >60)
GFR, Estimated: >60 mL/min (ref >60)
Glucose, Bld: 99 mg/dL (ref 70–99)
Potassium: 3.9 mmol/L (ref 3.5–5.1)
Sodium: 140 mmol/L (ref 135–145)
Total Bilirubin: 0.3 mg/dL (ref 0.3–1.2)
Total Protein: 6.5 g/dL (ref 6.5–8.1)

## 2019-12-04 MED ORDER — SODIUM CHLORIDE 0.9 % IV SOLN
10.0000 mg | Freq: Once | INTRAVENOUS | Status: AC
  Administered 2019-12-04: 10 mg via INTRAVENOUS
  Filled 2019-12-04: qty 10

## 2019-12-04 MED ORDER — HEPARIN SOD (PORK) LOCK FLUSH 100 UNIT/ML IV SOLN
500.0000 [IU] | Freq: Once | INTRAVENOUS | Status: AC | PRN
  Administered 2019-12-04: 500 [IU]
  Filled 2019-12-04: qty 5

## 2019-12-04 MED ORDER — FAMOTIDINE IN NACL 20-0.9 MG/50ML-% IV SOLN
INTRAVENOUS | Status: AC
  Filled 2019-12-04: qty 50

## 2019-12-04 MED ORDER — SODIUM CHLORIDE 0.9% FLUSH
10.0000 mL | INTRAVENOUS | Status: DC | PRN
  Administered 2019-12-04: 10 mL
  Filled 2019-12-04: qty 10

## 2019-12-04 MED ORDER — SODIUM CHLORIDE 0.9% FLUSH
10.0000 mL | INTRAVENOUS | Status: DC | PRN
  Filled 2019-12-04: qty 10

## 2019-12-04 MED ORDER — SODIUM CHLORIDE 0.9 % IV SOLN
Freq: Once | INTRAVENOUS | Status: AC
  Filled 2019-12-04: qty 250

## 2019-12-04 MED ORDER — DIPHENHYDRAMINE HCL 50 MG/ML IJ SOLN
INTRAMUSCULAR | Status: AC
  Filled 2019-12-04: qty 1

## 2019-12-04 MED ORDER — SODIUM CHLORIDE 0.9 % IV SOLN
50.0000 mg/m2 | Freq: Once | INTRAVENOUS | Status: AC
  Administered 2019-12-04: 90 mg via INTRAVENOUS
  Filled 2019-12-04: qty 15

## 2019-12-04 MED ORDER — DIPHENHYDRAMINE HCL 50 MG/ML IJ SOLN
25.0000 mg | Freq: Once | INTRAMUSCULAR | Status: AC
  Administered 2019-12-04: 25 mg via INTRAVENOUS

## 2019-12-04 MED ORDER — FAMOTIDINE IN NACL 20-0.9 MG/50ML-% IV SOLN
20.0000 mg | Freq: Once | INTRAVENOUS | Status: AC
  Administered 2019-12-04: 20 mg via INTRAVENOUS

## 2019-12-04 NOTE — Patient Instructions (Signed)
[facility] Discharge Instructions for Patients Receiving Chemotherapy  Today you received the following chemotherapy agents taxol  To help prevent nausea and vomiting after your treatment, we encourage you to take your nausea medication as directed   If you develop nausea and vomiting that is not controlled by your nausea medication, call the clinic.   BELOW ARE SYMPTOMS THAT SHOULD BE REPORTED IMMEDIATELY:  *FEVER GREATER THAN 100.5 F  *CHILLS WITH OR WITHOUT FEVER  NAUSEA AND VOMITING THAT IS NOT CONTROLLED WITH YOUR NAUSEA MEDICATION  *UNUSUAL SHORTNESS OF BREATH  *UNUSUAL BRUISING OR BLEEDING  TENDERNESS IN MOUTH AND THROAT WITH OR WITHOUT PRESENCE OF ULCERS  *URINARY PROBLEMS  *BOWEL PROBLEMS  UNUSUAL RASH Items with * indicate a potential emergency and should be followed up as soon as possible.  Feel free to call the clinic should you have any questions or concerns. The clinic phone number is [phone].  Please show the CHEMO ALERT CARD at check-in to the Emergency Department and triage nurse.   

## 2019-12-04 NOTE — Addendum Note (Signed)
Addended by: [name] on: 12/04/2019 01:35 PM   Modules accepted: Level of Service

## 2019-12-04 NOTE — Assessment & Plan Note (Signed)
08/17/19:Patient palpated a right breast mass and right axilla mass and noted skin redness x2 weeks. She went to the ED and had an abscess drained with no improvement. Diagnostic mammogram and US showed a 7.3cm right breast mass extending into all 4 quadrants and partially into overlying skin with 12 abnormal right axillary lymph nodes. 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  Treatment plan: 1. Neoadjuvant chemotherapy with Adriamycin and Cytoxan dose dense 4 followed byTaxolweekly 12 2. Followed by breastmastectomy and axillary lymph node dissection 3. Followed by adjuvant radiation therapy 4.Follow-up adjuvant antiestrogen therapy  CT CAP 08/31/2019: Very large central right breast mass, bulky right axillary and subpectoral lymph nodes. No evidence of distant metastatic disease. Bulky uterine fibroids Bone scan 08/31/2019: No metastatic disease in the bones ---------------------------------------------------------------------------------------------------------------------------------------------------- Current treatment:Cycle6Taxol Echo: 08/29/2019: EF60-65%   Chemo toxicities: Chemotherapy-induced anemia: Hemoglobin today is 10.4 ANC 1.4: She is receiving a reduced dosage of Taxol off 65 mg/m. Elevated AST and ALT: Currently being monitored.  If it continues to go up we may have to reduce the dosage. Denies any peripheral neuropathy.  Return to clinicweekly for Taxol and follow-up with me every 2 weeks

## 2019-12-04 NOTE — Progress Notes (Signed)
ANC 1.4 today. Okay to treat per Dr. Lindi Adie.

## 2019-12-04 NOTE — Progress Notes (Signed)
Nutrition Follow-up:   Patient with right breast cancer.  Receiving neoadjuvant chemotherapy.    Met with patient during infusion.  Patient sleepy from benadryl given during infusion.  Patient reports appetite continues to be good.      Medications: reviewed  Labs: reviewed  Anthropometrics:   Weight 153 lb today increased from 145 lb on 4/20   NUTRITION DIAGNOSIS:  Inadequate oral intake improved   INTERVENTION:  Encouraged well-balanced diet.   Patient to reach out to RD if needed in the future.    NEXT VISIT: no follow-up  Daijon Wenke B. Zenia Resides, [place], [place] Registered Dietitian [phone] (pager)

## 2019-12-10 ENCOUNTER — Telehealth: Payer: Self-pay | Admitting: Hematology and Oncology

## 2019-12-10 NOTE — Telephone Encounter (Signed)
Scheduled per 06/08 los, patient has been called and notified. 

## 2019-12-11 ENCOUNTER — Inpatient Hospital Stay: Payer: Medicaid Other

## 2019-12-11 ENCOUNTER — Other Ambulatory Visit: Payer: Self-pay

## 2019-12-11 ENCOUNTER — Inpatient Hospital Stay: Payer: Medicaid Other | Admitting: Licensed Clinical Social Worker

## 2019-12-11 VITALS — BP 140/96 | HR 88 | Temp 98.2°F | Resp 18 | Wt 155.5 lb

## 2019-12-11 DIAGNOSIS — C50811 Malignant neoplasm of overlapping sites of right female breast: Secondary | ICD-10-CM

## 2019-12-11 DIAGNOSIS — Z95828 Presence of other vascular implants and grafts: Secondary | ICD-10-CM

## 2019-12-11 DIAGNOSIS — Z5111 Encounter for antineoplastic chemotherapy: Secondary | ICD-10-CM | POA: Diagnosis not present

## 2019-12-11 LAB — CMP (CANCER CENTER ONLY)
ALT: 50 U/L — ABNORMAL HIGH (ref 0–44)
AST: 45 U/L — ABNORMAL HIGH (ref 15–41)
Albumin: 3.6 g/dL (ref 3.5–5.0)
Alkaline Phosphatase: 59 U/L (ref 38–126)
Anion gap: 8 (ref 5–15)
BUN: 10 mg/dL (ref 6–20)
CO2: 24 mmol/L (ref 22–32)
Calcium: 9.1 mg/dL (ref 8.9–10.3)
Chloride: 108 mmol/L (ref 98–111)
Creatinine: 0.82 mg/dL (ref 0.44–1.00)
GFR, Est AFR Am: >60 mL/min (ref >60)
GFR, Estimated: >60 mL/min (ref >60)
Glucose, Bld: 101 mg/dL — ABNORMAL HIGH (ref 70–99)
Potassium: 4.2 mmol/L (ref 3.5–5.1)
Sodium: 140 mmol/L (ref 135–145)
Total Bilirubin: 0.3 mg/dL (ref 0.3–1.2)
Total Protein: 6.6 g/dL (ref 6.5–8.1)

## 2019-12-11 LAB — CBC WITH DIFFERENTIAL (CANCER CENTER ONLY)
Abs Immature Granulocytes: 0.01 10*3/uL (ref 0.00–0.07)
Basophils Absolute: 0 10*3/uL (ref 0.0–0.1)
Basophils Relative: 1 %
Eosinophils Absolute: 0.2 10*3/uL (ref 0.0–0.5)
Eosinophils Relative: 7 %
HCT: 33 % — ABNORMAL LOW (ref 36.0–46.0)
Hemoglobin: 11 g/dL — ABNORMAL LOW (ref 12.0–15.0)
Immature Granulocytes: 0 %
Lymphocytes Relative: 38 %
Lymphs Abs: 1.1 10*3/uL (ref 0.7–4.0)
MCH: 32.5 pg (ref 26.0–34.0)
MCHC: 33.3 g/dL (ref 30.0–36.0)
MCV: 97.6 fL (ref 80.0–100.0)
Monocytes Absolute: 0.5 10*3/uL (ref 0.1–1.0)
Monocytes Relative: 18 %
Neutro Abs: 1 10*3/uL — ABNORMAL LOW (ref 1.7–7.7)
Neutrophils Relative %: 36 %
Platelet Count: 243 10*3/uL (ref 150–400)
RBC: 3.38 MIL/uL — ABNORMAL LOW (ref 3.87–5.11)
RDW: 15.1 % (ref 11.5–15.5)
WBC Count: 2.7 10*3/uL — ABNORMAL LOW (ref 4.0–10.5)
nRBC: 0 % (ref 0.0–0.2)

## 2019-12-11 MED ORDER — HEPARIN SOD (PORK) LOCK FLUSH 100 UNIT/ML IV SOLN
500.0000 [IU] | Freq: Once | INTRAVENOUS | Status: AC | PRN
  Administered 2019-12-11: 500 [IU]
  Filled 2019-12-11: qty 5

## 2019-12-11 MED ORDER — SODIUM CHLORIDE 0.9% FLUSH
10.0000 mL | INTRAVENOUS | Status: DC | PRN
  Administered 2019-12-11: 10 mL
  Filled 2019-12-11: qty 10

## 2019-12-11 MED ORDER — FAMOTIDINE IN NACL 20-0.9 MG/50ML-% IV SOLN
INTRAVENOUS | Status: AC
  Filled 2019-12-11: qty 50

## 2019-12-11 MED ORDER — FAMOTIDINE IN NACL 20-0.9 MG/50ML-% IV SOLN
20.0000 mg | Freq: Once | INTRAVENOUS | Status: AC
  Administered 2019-12-11: 20 mg via INTRAVENOUS

## 2019-12-11 MED ORDER — DIPHENHYDRAMINE HCL 50 MG/ML IJ SOLN
INTRAMUSCULAR | Status: AC
  Filled 2019-12-11: qty 1

## 2019-12-11 MED ORDER — DIPHENHYDRAMINE HCL 50 MG/ML IJ SOLN
25.0000 mg | Freq: Once | INTRAMUSCULAR | Status: AC
  Administered 2019-12-11: 25 mg via INTRAVENOUS

## 2019-12-11 MED ORDER — SODIUM CHLORIDE 0.9 % IV SOLN
50.0000 mg/m2 | Freq: Once | INTRAVENOUS | Status: AC
  Administered 2019-12-11: 90 mg via INTRAVENOUS
  Filled 2019-12-11: qty 15

## 2019-12-11 MED ORDER — SODIUM CHLORIDE 0.9 % IV SOLN
10.0000 mg | Freq: Once | INTRAVENOUS | Status: AC
  Administered 2019-12-11: 10 mg via INTRAVENOUS
  Filled 2019-12-11: qty 10

## 2019-12-11 MED ORDER — SODIUM CHLORIDE 0.9 % IV SOLN
Freq: Once | INTRAVENOUS | Status: AC
  Filled 2019-12-11: qty 250

## 2019-12-11 NOTE — Patient Instructions (Signed)
[facility] Discharge Instructions for Patients Receiving Chemotherapy  Today you received the following chemotherapy agents Paclitaxel (TAXOL).  To help prevent nausea and vomiting after your treatment, we encourage you to take your nausea medication as prescribed.  If you develop nausea and vomiting that is not controlled by your nausea medication, call the clinic.   BELOW ARE SYMPTOMS THAT SHOULD BE REPORTED IMMEDIATELY:  *FEVER GREATER THAN 100.5 F  *CHILLS WITH OR WITHOUT FEVER  NAUSEA AND VOMITING THAT IS NOT CONTROLLED WITH YOUR NAUSEA MEDICATION  *UNUSUAL SHORTNESS OF BREATH  *UNUSUAL BRUISING OR BLEEDING  TENDERNESS IN MOUTH AND THROAT WITH OR WITHOUT PRESENCE OF ULCERS  *URINARY PROBLEMS  *BOWEL PROBLEMS  UNUSUAL RASH Items with * indicate a potential emergency and should be followed up as soon as possible.  Feel free to call the clinic should you have any questions or concerns. The clinic phone number is [phone].  Please show the CHEMO ALERT CARD at check-in to the Emergency Department and triage nurse.   

## 2019-12-11 NOTE — Progress Notes (Signed)
[place] CSW Progress Note  Holiday representative met with patient to follow-up on applications. CSW scanned & emailed [employer] application to patient for her to submit with copy of bill. Patient had Emergency Rental Assistance Program (ERAP) paperwork, but did not have supporting documentation today. She will provide to CSW so full application can be sent to [employer].     Kamisha Ell E Oluwadamilola Rosamond LCSW, LCSW

## 2019-12-11 NOTE — Progress Notes (Signed)
Verbal order from Dr. Alvy Bimler; okay to treat with ANC of 1.0, patient educated on Neutropenic precautions.

## 2019-12-11 NOTE — Patient Instructions (Signed)

## 2019-12-18 ENCOUNTER — Other Ambulatory Visit: Payer: Self-pay

## 2019-12-18 ENCOUNTER — Inpatient Hospital Stay: Payer: Medicaid Other

## 2019-12-18 ENCOUNTER — Encounter: Payer: Self-pay | Admitting: *Deleted

## 2019-12-18 ENCOUNTER — Encounter: Payer: Self-pay | Admitting: Adult Health

## 2019-12-18 ENCOUNTER — Inpatient Hospital Stay (HOSPITAL_BASED_OUTPATIENT_CLINIC_OR_DEPARTMENT_OTHER): Payer: Medicaid Other | Admitting: Adult Health

## 2019-12-18 ENCOUNTER — Other Ambulatory Visit: Payer: Self-pay | Admitting: Hematology and Oncology

## 2019-12-18 VITALS — BP 140/90 | HR 87 | Temp 98.5°F | Resp 16 | Ht 69.0 in | Wt 153.0 lb

## 2019-12-18 DIAGNOSIS — C50811 Malignant neoplasm of overlapping sites of right female breast: Secondary | ICD-10-CM

## 2019-12-18 DIAGNOSIS — Z170 Estrogen receptor positive status [ER+]: Secondary | ICD-10-CM

## 2019-12-18 DIAGNOSIS — Z5111 Encounter for antineoplastic chemotherapy: Secondary | ICD-10-CM | POA: Diagnosis not present

## 2019-12-18 DIAGNOSIS — Z95828 Presence of other vascular implants and grafts: Secondary | ICD-10-CM

## 2019-12-18 LAB — CMP (CANCER CENTER ONLY)
ALT: 33 U/L (ref 0–44)
AST: 31 U/L (ref 15–41)
Albumin: 3.9 g/dL (ref 3.5–5.0)
Alkaline Phosphatase: 65 U/L (ref 38–126)
Anion gap: 9 (ref 5–15)
BUN: 13 mg/dL (ref 6–20)
CO2: 24 mmol/L (ref 22–32)
Calcium: 9.2 mg/dL (ref 8.9–10.3)
Chloride: 107 mmol/L (ref 98–111)
Creatinine: 0.88 mg/dL (ref 0.44–1.00)
GFR, Est AFR Am: >60 mL/min (ref >60)
GFR, Estimated: >60 mL/min (ref >60)
Glucose, Bld: 90 mg/dL (ref 70–99)
Potassium: 3.8 mmol/L (ref 3.5–5.1)
Sodium: 140 mmol/L (ref 135–145)
Total Bilirubin: 0.3 mg/dL (ref 0.3–1.2)
Total Protein: 7.2 g/dL (ref 6.5–8.1)

## 2019-12-18 LAB — CBC WITH DIFFERENTIAL (CANCER CENTER ONLY)
Abs Immature Granulocytes: 0 10*3/uL (ref 0.00–0.07)
Basophils Absolute: 0 10*3/uL (ref 0.0–0.1)
Basophils Relative: 0 %
Eosinophils Absolute: 0.1 10*3/uL (ref 0.0–0.5)
Eosinophils Relative: 3 %
HCT: 34.4 % — ABNORMAL LOW (ref 36.0–46.0)
Hemoglobin: 11.8 g/dL — ABNORMAL LOW (ref 12.0–15.0)
Immature Granulocytes: 0 %
Lymphocytes Relative: 39 %
Lymphs Abs: 1 10*3/uL (ref 0.7–4.0)
MCH: 32.1 pg (ref 26.0–34.0)
MCHC: 34.3 g/dL (ref 30.0–36.0)
MCV: 93.5 fL (ref 80.0–100.0)
Monocytes Absolute: 0.4 10*3/uL (ref 0.1–1.0)
Monocytes Relative: 16 %
Neutro Abs: 1 10*3/uL — ABNORMAL LOW (ref 1.7–7.7)
Neutrophils Relative %: 42 %
Platelet Count: 227 10*3/uL (ref 150–400)
RBC: 3.68 MIL/uL — ABNORMAL LOW (ref 3.87–5.11)
RDW: 13.7 % (ref 11.5–15.5)
WBC Count: 2.5 10*3/uL — ABNORMAL LOW (ref 4.0–10.5)
nRBC: 0 % (ref 0.0–0.2)

## 2019-12-18 MED ORDER — SODIUM CHLORIDE 0.9% FLUSH
10.0000 mL | INTRAVENOUS | Status: DC | PRN
  Administered 2019-12-18: 10 mL
  Filled 2019-12-18: qty 10

## 2019-12-18 MED ORDER — HEPARIN SOD (PORK) LOCK FLUSH 100 UNIT/ML IV SOLN
500.0000 [IU] | Freq: Once | INTRAVENOUS | Status: AC | PRN
  Administered 2019-12-18: 500 [IU]
  Filled 2019-12-18: qty 5

## 2019-12-18 MED ORDER — SODIUM CHLORIDE 0.9 % IV SOLN
Freq: Once | INTRAVENOUS | Status: AC
  Filled 2019-12-18: qty 250

## 2019-12-18 MED ORDER — FAMOTIDINE IN NACL 20-0.9 MG/50ML-% IV SOLN
20.0000 mg | Freq: Once | INTRAVENOUS | Status: AC
  Administered 2019-12-18: 20 mg via INTRAVENOUS

## 2019-12-18 MED ORDER — SODIUM CHLORIDE 0.9 % IV SOLN
10.0000 mg | Freq: Once | INTRAVENOUS | Status: AC
  Administered 2019-12-18: 10 mg via INTRAVENOUS
  Filled 2019-12-18: qty 10

## 2019-12-18 MED ORDER — DIPHENHYDRAMINE HCL 50 MG/ML IJ SOLN
25.0000 mg | Freq: Once | INTRAMUSCULAR | Status: AC
  Administered 2019-12-18: 25 mg via INTRAVENOUS

## 2019-12-18 MED ORDER — FAMOTIDINE IN NACL 20-0.9 MG/50ML-% IV SOLN
INTRAVENOUS | Status: AC
  Filled 2019-12-18: qty 50

## 2019-12-18 MED ORDER — SODIUM CHLORIDE 0.9 % IV SOLN
50.0000 mg/m2 | Freq: Once | INTRAVENOUS | Status: AC
  Administered 2019-12-18: 90 mg via INTRAVENOUS
  Filled 2019-12-18: qty 15

## 2019-12-18 MED ORDER — DIPHENHYDRAMINE HCL 50 MG/ML IJ SOLN
INTRAMUSCULAR | Status: AC
  Filled 2019-12-18: qty 1

## 2019-12-18 NOTE — Assessment & Plan Note (Addendum)
08/17/19:Patient palpated a right breast mass and right axilla mass and noted skin redness x2 weeks. She went to the ED and had an abscess drained with no improvement. Diagnostic mammogram and US showed a 7.3cm right breast mass extending into all 4 quadrants and partially into overlying skin with 12 abnormal right axillary lymph nodes. 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  Treatment plan: 1. Neoadjuvant chemotherapy with Adriamycin and Cytoxan dose dense 4 followed byTaxolweekly 12 2. Followed by breastmastectomy and axillary lymph node dissection 3. Followed by adjuvant radiation therapy 4.Follow-up adjuvant antiestrogen therapy  CT CAP 08/31/2019: Very large central right breast mass, bulky right axillary and subpectoral lymph nodes. No evidence of distant metastatic disease. Bulky uterine fibroids Bone scan 08/31/2019: No metastatic disease in the bones ---------------------------------------------------------------------------------------------------------------------------------------------------- Current treatment:Cycle8Taxol Echo: 08/29/2019: EF60-65%   Chemo toxicities: Gina Ross is tolerating her chemotherapy well.  She has a mild intermittent neuropathy in her toes.  This has not progressed, and she is not currently experiencing it.  She also has a mildly decreased ANC of 1.0.  She will still receive treatment today as her chemotherapy is dose reduced at 88m/m2.  She and I reviewed the possibility of receiving Neupogen or a Neupogen biosimilar, however she notes she was miserable with Neulasta so she doesn't want to do this unless she absolutely has to.     I placed orders for her breast MRI today.    We will see her back weekly for Taxol, and f/u every 2 weeks.

## 2019-12-18 NOTE — Progress Notes (Signed)
[place] 1: Okay to proceed with today's treatment

## 2019-12-18 NOTE — Patient Instructions (Signed)
[facility] Discharge Instructions for Patients Receiving Chemotherapy  Today you received the following chemotherapy agents Paclitaxel (TAXOL).  To help prevent nausea and vomiting after your treatment, we encourage you to take your nausea medication as prescribed.  If you develop nausea and vomiting that is not controlled by your nausea medication, call the clinic.   BELOW ARE SYMPTOMS THAT SHOULD BE REPORTED IMMEDIATELY:  *FEVER GREATER THAN 100.5 F  *CHILLS WITH OR WITHOUT FEVER  NAUSEA AND VOMITING THAT IS NOT CONTROLLED WITH YOUR NAUSEA MEDICATION  *UNUSUAL SHORTNESS OF BREATH  *UNUSUAL BRUISING OR BLEEDING  TENDERNESS IN MOUTH AND THROAT WITH OR WITHOUT PRESENCE OF ULCERS  *URINARY PROBLEMS  *BOWEL PROBLEMS  UNUSUAL RASH Items with * indicate a potential emergency and should be followed up as soon as possible.  Feel free to call the clinic should you have any questions or concerns. The clinic phone number is [phone].  Please show the CHEMO ALERT CARD at check-in to the Emergency Department and triage nurse.   

## 2019-12-18 NOTE — Progress Notes (Signed)
Verbal order per Dr. Lindi Adie: okay to treat patient with ANC of 1.0

## 2019-12-18 NOTE — Progress Notes (Signed)
[facility] Follow up:    Patient, [name] No address on file   DIAGNOSIS: Cancer Staging Malignant neoplasm of overlapping sites of right breast in female, estrogen receptor positive ([place]) Staging form: Breast, AJCC 8th Edition - Clinical stage from 08/22/2019: Stage IIIB (cT3, cN1, cM0, G3, ER+, PR-, HER2-) - Unsigned   SUMMARY OF ONCOLOGIC HISTORY: Oncology History  Malignant neoplasm of overlapping sites of right breast in female, estrogen receptor positive ([place])  08/17/2019 Initial Diagnosis   Patient palpated a right breast mass and right axilla mass and noted skin redness x2 weeks. She went to the ED and had an abscess drained with no improvement. Diagnostic mammogram and US showed a 7.3cm right breast mass extending into all 4 quadrants and partially into overlying skin with 12 abnormal right axillary lymph nodes. Biopsy  showed IDC with necrosis in the breast and axilla, grade 3, HER-2 equivocal by IHC, negative by FISH, ER+ 40% weak, PR -, Ki67 90%.    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 The report date is 09/03/2019.  The Common Hereditary Cancers Panel offered by [facility] includes sequencing and/or deletion duplication testing of the following 48 genes: APC, ATM, AXIN2, BARD1, BMPR1A, BRCA1, BRCA2, BRIP1, CDH1, CDK4, CDKN2A (p14ARF), CDKN2A (p16INK4a), CHEK2, CTNNA1, DICER1, EPCAM (Deletion/duplication testing only), GREM1 (promoter region deletion/duplication testing only), KIT, MEN1, MLH1, MSH2, MSH3, MSH6, MUTYH, NBN, NF1, NHTL1, PALB2, PDGFRA, PMS2, POLD1, POLE, PTEN, RAD50, RAD51C, RAD51D, RNF43, SDHB, SDHC, SDHD, SMAD4, SMARCA4. STK11, TP53, TSC1, TSC2, and VHL.  The following genes were evaluated for sequence changes only: SDHA and HOXB13 c.251G>A variant only.    09/04/2019 -  Chemotherapy   The patient had  dexamethasone (DECADRON) 4 MG tablet, 4 mg (100 % of original dose 4 mg), Oral, Daily, 1 of 1 cycle, Start date: 09/11/2019, End date: 09/13/2019 Dose modification: 4 mg (original dose 4 mg, Cycle 0) DOXOrubicin (ADRIAMYCIN) chemo injection 110 mg, 60 mg/m2 = 110 mg, Intravenous,  Once, 4 of 4 cycles Dose modification: 50 mg/m2 (original dose 60 mg/m2, Cycle 2, Reason: Provider Judgment) Administration: 110 mg (09/04/2019), 92 mg (09/18/2019), 92 mg (10/02/2019), 92 mg (10/16/2019) palonosetron (ALOXI) injection 0.25 mg, 0.25 mg, Intravenous,  Once, 4 of 4 cycles Administration: 0.25 mg (09/04/2019), 0.25 mg (09/18/2019), 0.25 mg (10/02/2019), 0.25 mg (10/16/2019) pegfilgrastim-cbqv (UDENYCA) injection 6 mg, 6 mg, Subcutaneous, Once, 4 of 4 cycles Administration: 6 mg (09/06/2019), 6 mg (09/20/2019), 6 mg (10/04/2019), 6 mg (10/18/2019) cyclophosphamide (CYTOXAN) 1,100 mg in sodium chloride 0.9 % 250 mL chemo infusion, 600 mg/m2 = 1,100 mg, Intravenous,  Once, 4 of 4 cycles Dose modification: 500 mg/m2 (original dose 600 mg/m2, Cycle 2, Reason: Provider Judgment) Administration: 1,100 mg (09/04/2019), 920 mg (09/18/2019), 920 mg (10/02/2019), 920 mg (10/16/2019) PACLitaxel (TAXOL) 144 mg in sodium chloride 0.9 % 250 mL chemo infusion (</= '80mg'$ /m2), 80 mg/m2 = 144 mg, Intravenous,  Once, 7 of 12 cycles Dose modification: 65 mg/m2 (original dose 80 mg/m2, Cycle 6, Reason: Dose not tolerated), 50 mg/m2 (original dose 80 mg/m2, Cycle 7, Reason: Provider Judgment) Administration: 144 mg (10/30/2019), 120 mg (11/06/2019), 90 mg (11/13/2019), 90 mg (11/20/2019), 90 mg (11/27/2019), 90 mg (12/04/2019), 90 mg (12/11/2019) fosaprepitant (EMEND) 150 mg in sodium chloride 0.9 % 145 mL IVPB, 150 mg, Intravenous,  Once, 4 of 4 cycles Administration: 150 mg (09/04/2019), 150 mg (09/18/2019), 150  mg (10/02/2019), 150 mg (10/16/2019)  for chemotherapy treatment.      CURRENT THERAPY: week 8 Taxol  INTERVAL HISTORY: SIMARA RHYNER 49 y.o. female  returns for evaluation prior to receiving her eighth dose of weekly neoadjuvant chemotherapy with Taxol.  She has already completed the first four cycles of dose dense Adriamcyin and Cytoxan as well.    Sharyn Lull is doing well on chemotherapy.  She doesn't need to take any nausea medications.  She also notes her breast has completely improved.  Sharyn Lull is living at home alone.      Patient Active Problem List   Diagnosis Date Noted  . Port-A-Cath in place 09/18/2019  . BRCA1 gene mutation positive in female 09/05/2019  . Genetic testing 09/05/2019  . Family history of breast cancer   . Family history of prostate cancer   . Family history of colon cancer   . Malignant neoplasm of overlapping sites of right breast in female, estrogen receptor positive ([place]) 08/17/2019  . Unilateral primary osteoarthritis, right knee 01/31/2017    is allergic to penicillins.  MEDICAL HISTORY: Past Medical History:  Diagnosis Date  . Asthma    as a child  . Cancer ([place]) 07/2019   breast  . Family history of breast cancer   . Family history of colon cancer   . Family history of prostate cancer     SURGICAL HISTORY: Past Surgical History:  Procedure Laterality Date  . COLONOSCOPY    . HARDWARE REMOVAL Right 09/11/2014   Procedure: Removal Deep Hardware Right Knee;  Surgeon: Newt Minion, MD;  Location: [facility];  Service: Orthopedics;  Laterality: Right;  . IR IMAGING GUIDED PORT INSERTION  09/04/2019  . ORIF PATELLA Right 03/22/2014   Procedure: Open Reduction Internal Fixation Right Patella ;  Surgeon: Newt Minion, MD;  Location: [facility];  Service: Orthopedics;  Laterality: Right;    SOCIAL HISTORY: Social History   Socioeconomic History  . Marital status: Legally Separated    Spouse name: Not on file  . Number of children: Not on file  . Years of education: Not on file  . Highest education level: Professional school degree (e.g., MD, DDS, DVM, JD)  Occupational History  . Not on file   Tobacco Use  . Smoking status: Current Every Day Smoker    Packs/day: 0.10  . Smokeless tobacco: Never Used  . Tobacco comment: marijuana  Vaping Use  . Vaping Use: Never used  Substance and Sexual Activity  . Alcohol use: Yes    Comment: socailly  . Drug use: Yes    Types: Marijuana  . Sexual activity: Yes    Birth control/protection: None  Other Topics Concern  . Not on file  Social History Narrative  . Not on file   Social Determinants of Health   Financial Resource Strain:   . Difficulty of Paying Living Expenses:   Food Insecurity:   . Worried About Charity fundraiser in the Last Year:   . Arboriculturist in the Last Year:   Transportation Needs: Unmet Transportation Needs  . Lack of Transportation (Medical): Yes  . Lack of Transportation (Non-Medical): Yes  Physical Activity:   . Days of Exercise per Week:   . Minutes of Exercise per Session:   Stress:   . Feeling of Stress :   Social Connections:   . Frequency of Communication with Friends and Family:   . Frequency of Social Gatherings with Friends and Family:   .  Attends Religious Services:   . Active Member of Clubs or Organizations:   . Attends Archivist Meetings:   Marland Kitchen Marital Status:   Intimate Partner Violence:   . Fear of Current or Ex-Partner:   . Emotionally Abused:   Marland Kitchen Physically Abused:   . Sexually Abused:     FAMILY HISTORY: Family History  Problem Relation Age of Onset  . Hypertension Mother   . Diabetes Mother   . Breast cancer Mother        dx. 6s  . Hypertension Father   . Colon cancer Paternal Aunt        dx. 65s  . Breast cancer Maternal Grandmother        bilateral  . Cervical cancer Maternal Grandmother        tumor on cervix  . Prostate cancer Maternal Uncle        dx. 48s  . Breast cancer Cousin        dx. late 25s  . Breast cancer Cousin        dx. <50    Review of Systems  Constitutional: Negative for appetite change, chills, fatigue, fever and  unexpected weight change.  HENT:   Negative for hearing loss, lump/mass and trouble swallowing.   Eyes: Negative for eye problems and icterus.  Respiratory: Negative for chest tightness, cough and shortness of breath.   Cardiovascular: Negative for chest pain, leg swelling and palpitations.  Gastrointestinal: Negative for abdominal distention, abdominal pain, constipation, diarrhea, nausea and vomiting.  Endocrine: Positive for hot flashes.  Genitourinary: Negative for difficulty urinating.   Musculoskeletal: Negative for arthralgias.  Skin: Negative for itching and rash.  Neurological: Negative for dizziness, extremity weakness, headaches and numbness.  Hematological: Negative for adenopathy.  Psychiatric/Behavioral: Negative for depression. The patient is not nervous/anxious.       PHYSICAL EXAMINATION  ECOG PERFORMANCE STATUS: 1 - Symptomatic but completely ambulatory  Vitals:   12/18/19 1117  BP: 140/90  Pulse: 87  Resp: 16  Temp: 98.5 F (36.9 C)  SpO2: 100%    Physical Exam Constitutional:      General: She is not in acute distress.    Appearance: Normal appearance. She is not toxic-appearing.  HENT:     Head: Normocephalic and atraumatic.  Eyes:     General: No scleral icterus. Cardiovascular:     Rate and Rhythm: Normal rate and regular rhythm.     Pulses: Normal pulses.     Heart sounds: Normal heart sounds.  Pulmonary:     Effort: Pulmonary effort is normal.     Breath sounds: Normal breath sounds.  Abdominal:     General: Abdomen is flat. Bowel sounds are normal.     Palpations: Abdomen is soft.  Musculoskeletal:        General: No swelling.     Cervical back: Neck supple.  Lymphadenopathy:     Cervical: No cervical adenopathy.  Skin:    General: Skin is warm and dry.     Capillary Refill: Capillary refill takes less than 2 seconds.     Findings: No rash.  Neurological:     General: No focal deficit present.     Mental Status: She is alert.   Psychiatric:        Mood and Affect: Mood normal.        Behavior: Behavior normal.     LABORATORY DATA:  CBC    Component Value Date/Time   WBC 2.5 (L) 12/18/2019 1038  WBC 3.5 (L) 09/04/2019 1013   RBC 3.68 (L) 12/18/2019 1038   HGB 11.8 (L) 12/18/2019 1038   HCT 34.4 (L) 12/18/2019 1038   PLT 227 12/18/2019 1038   MCV 93.5 12/18/2019 1038   MCH 32.1 12/18/2019 1038   MCHC 34.3 12/18/2019 1038   RDW 13.7 12/18/2019 1038   LYMPHSABS 1.0 12/18/2019 1038   MONOABS 0.4 12/18/2019 1038   EOSABS 0.1 12/18/2019 1038   BASOSABS 0.0 12/18/2019 1038    CMP     Component Value Date/Time   NA 140 12/18/2019 1038   K 3.8 12/18/2019 1038   CL 107 12/18/2019 1038   CO2 24 12/18/2019 1038   GLUCOSE 90 12/18/2019 1038   BUN 13 12/18/2019 1038   CREATININE 0.88 12/18/2019 1038   CALCIUM 9.2 12/18/2019 1038   PROT 7.2 12/18/2019 1038   ALBUMIN 3.9 12/18/2019 1038   AST 31 12/18/2019 1038   ALT 33 12/18/2019 1038   ALKPHOS 65 12/18/2019 1038   BILITOT 0.3 12/18/2019 1038   GFRNONAA >60 12/18/2019 1038   GFRAA >60 12/18/2019 1038         ASSESSMENT and THERAPY PLAN:   Malignant neoplasm of overlapping sites of right breast in female, estrogen receptor positive ([place]) 08/17/19:Patient palpated a right breast mass and right axilla mass and noted skin redness x2 weeks. She went to the ED and had an abscess drained with no improvement. Diagnostic mammogram and US showed a 7.3cm right breast mass extending into all 4 quadrants and partially into overlying skin with 12 abnormal right axillary lymph nodes. 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  Treatment plan: 1. Neoadjuvant chemotherapy with Adriamycin and Cytoxan dose dense 4 followed byTaxolweekly 12 2. Followed by breastmastectomy and axillary lymph node dissection 3. Followed by adjuvant radiation  therapy 4.Follow-up adjuvant antiestrogen therapy  CT CAP 08/31/2019: Very large central right breast mass, bulky right axillary and subpectoral lymph nodes. No evidence of distant metastatic disease. Bulky uterine fibroids Bone scan 08/31/2019: No metastatic disease in the bones ---------------------------------------------------------------------------------------------------------------------------------------------------- Current treatment:Cycle8Taxol Echo: 08/29/2019: EF60-65%   Chemo toxicities: Sharyn Lull is tolerating her chemotherapy well.  She has a mild intermittent neuropathy in her toes.  This has not progressed, and she is not currently experiencing it.  She also has a mildly decreased ANC of 1.0.  She will still receive treatment today as her chemotherapy is dose reduced at 93m/m2.  She and I reviewed the possibility of receiving Neupogen or a Neupogen biosimilar, however she notes she was miserable with Neulasta so she doesn't want to do this unless she absolutely has to.     I placed orders for her breast MRI today.    We will see her back weekly for Taxol, and f/u every 2 weeks.      Orders Placed This Encounter  Procedures  . MR Breast Bilateral Wo Contrast    Standing Status:   Future    Standing Expiration Date:   12/17/2020    Order Specific Question:   What is the patient's sedation requirement?    Answer:   No Sedation    Order Specific Question:   Does the patient have a pacemaker or implanted devices?    Answer:   No    Order Specific Question:   [facility]    [facility]  Order Specific Question:   Radiology Contrast Protocol - do NOT remove file path    Answer:   [email or web address]    All questions were answered. The patient knows to call the clinic with any problems, questions or concerns. We can certainly see the patient much sooner if necessary.  Total encounter  time: 20 minutes*  Wilber Bihari, NP 12/18/19 11:44 AM Medical Oncology and Hematology [facility] [place], [place] [address] Tel. [phone]    Fax. [phone]  *Total Encounter Time as defined by the Centers for Medicare and Medicaid Services includes, in addition to the face-to-face time of a patient visit (documented in the note above) non-face-to-face time: obtaining and reviewing outside history, ordering and reviewing medications, tests or procedures, care coordination (communications with other health care professionals or caregivers) and documentation in the medical record.

## 2019-12-19 ENCOUNTER — Telehealth: Payer: Self-pay | Admitting: Adult Health

## 2019-12-19 NOTE — Telephone Encounter (Signed)
No 6/22 los. No changes made to pt's schedule.  

## 2019-12-20 ENCOUNTER — Encounter: Payer: Self-pay | Admitting: Licensed Clinical Social Worker

## 2019-12-20 NOTE — Progress Notes (Signed)
[place] CSW Progress Note  Clinical Education officer, museum received additional paperwork from patient for emergency rental assistance application. CSW sent information and application to [employer] [facility] & [facility] [employer]) to process application and assist patient with any further needs regarding the ERAP program.      Paulo Fruit , LCSW

## 2019-12-24 MED FILL — Dexamethasone Sodium Phosphate Inj 100 MG/10ML: INTRAMUSCULAR | Qty: 1 | Status: AC

## 2019-12-25 ENCOUNTER — Other Ambulatory Visit: Payer: Self-pay

## 2019-12-25 ENCOUNTER — Inpatient Hospital Stay: Payer: Medicaid Other

## 2019-12-25 ENCOUNTER — Telehealth: Payer: Self-pay | Admitting: *Deleted

## 2019-12-25 ENCOUNTER — Encounter: Payer: Self-pay | Admitting: *Deleted

## 2019-12-25 ENCOUNTER — Other Ambulatory Visit: Payer: Self-pay | Admitting: General Surgery

## 2019-12-25 ENCOUNTER — Inpatient Hospital Stay (HOSPITAL_BASED_OUTPATIENT_CLINIC_OR_DEPARTMENT_OTHER): Payer: Medicaid Other | Admitting: Hematology and Oncology

## 2019-12-25 DIAGNOSIS — C50811 Malignant neoplasm of overlapping sites of right female breast: Secondary | ICD-10-CM | POA: Diagnosis not present

## 2019-12-25 DIAGNOSIS — Z5111 Encounter for antineoplastic chemotherapy: Secondary | ICD-10-CM | POA: Diagnosis not present

## 2019-12-25 DIAGNOSIS — Z170 Estrogen receptor positive status [ER+]: Secondary | ICD-10-CM

## 2019-12-25 NOTE — Assessment & Plan Note (Signed)
08/17/19:Patient palpated a right breast mass and right axilla mass and noted skin redness x2 weeks. She went to the ED and had an abscess drained with no improvement. Diagnostic mammogram and US showed a 7.3cm right breast mass extending into all 4 quadrants and partially into overlying skin with 12 abnormal right axillary lymph nodes. 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  Treatment plan: 1. Neoadjuvant chemotherapy with Adriamycin and Cytoxan dose dense 4 followed byTaxolweekly 8 stopped after cycle 8 for neuropathy 2. Followed by breastmastectomy and axillary lymph node dissection 3. Followed by adjuvant radiation therapy 4.Follow-up adjuvant antiestrogen therapy  CT CAP 08/31/2019: Very large central right breast mass, bulky right axillary and subpectoral lymph nodes. No evidence of distant metastatic disease. Bulky uterine fibroids Bone scan 08/31/2019: No metastatic disease in the bones ---------------------------------------------------------------------------------------------------------------------------------------------------- Chemo toxicities: 1.  Peripheral neuropathy: Moderately severe.  I discussed with her that we need to stop chemotherapy at this time because of rapid progression of neuropathy.  We discussed that it can be irreversible if he continue with the treatment.  Based on risks and benefits best to stop further Taxol treatments.  Patient has appointment for breast MRI. Follow-up after that with the surgery to discuss mastectomy and ALN D. Patient is hoping that she can have a lumpectomy but I discussed with her that based on the original presentation we do not recommend that.  Return to clinic after surgery to discuss pathology report.

## 2019-12-25 NOTE — Progress Notes (Signed)
Patient Care Team: Patient, [name] as PCP - General (General Practice) [name], RN as Oncology Nurse Navigator [name], RN as Oncology Nurse Navigator [name], MD as Consulting Physician (General Surgery) [name], MD as Consulting Physician (Hematology and Oncology) [name], MD as Consulting Physician (Radiation Oncology)  DIAGNOSIS:  Encounter Diagnosis  Name Primary?  . Malignant neoplasm of overlapping sites of right breast in female, estrogen receptor positive ([place])     SUMMARY OF ONCOLOGIC HISTORY: Oncology History  Malignant neoplasm of overlapping sites of right breast in female, estrogen receptor positive ([place])  08/17/2019 Initial Diagnosis   Patient palpated a right breast mass and right axilla mass and noted skin redness x2 weeks. She went to the ED and had an abscess drained with no improvement. Diagnostic mammogram and US showed a 7.3cm right breast mass extending into all 4 quadrants and partially into overlying skin with 12 abnormal right axillary lymph nodes. Biopsy  showed IDC with necrosis in the breast and axilla, grade 3, HER-2 equivocal by IHC, negative by FISH, ER+ 40% weak, PR -, Ki67 90%.    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 The report date is 09/03/2019.  The Common Hereditary Cancers Panel offered by [facility] includes sequencing and/or deletion duplication testing of the following 48 genes: APC, ATM, AXIN2, BARD1, BMPR1A, BRCA1, BRCA2, BRIP1, CDH1, CDK4, CDKN2A (p14ARF), CDKN2A (p16INK4a), CHEK2, CTNNA1, DICER1, EPCAM (Deletion/duplication testing only), GREM1 (promoter region deletion/duplication testing only), KIT, MEN1, MLH1, MSH2, MSH3, MSH6, MUTYH, NBN, NF1, NHTL1, PALB2, PDGFRA, PMS2, POLD1, POLE, PTEN, RAD50, RAD51C, RAD51D, RNF43, SDHB, SDHC, SDHD, SMAD4, SMARCA4. STK11,  TP53, TSC1, TSC2, and VHL.  The following genes were evaluated for sequence changes only: SDHA and HOXB13 c.251G>A variant only.    09/04/2019 -  Chemotherapy   The patient had dexamethasone (DECADRON) 4 MG tablet, 4 mg (100 % of original dose 4 mg), Oral, Daily, 1 of 1 cycle, Start date: 09/11/2019, End date: 09/13/2019 Dose modification: 4 mg (original dose 4 mg, Cycle 0) DOXOrubicin (ADRIAMYCIN) chemo injection 110 mg, 60 mg/m2 = 110 mg, Intravenous,  Once, 4 of 4 cycles Dose modification: 50 mg/m2 (original dose 60 mg/m2, Cycle 2, Reason: Provider Judgment) Administration: 110 mg (09/04/2019), 92 mg (09/18/2019), 92 mg (10/02/2019), 92 mg (10/16/2019) palonosetron (ALOXI) injection 0.25 mg, 0.25 mg, Intravenous,  Once, 4 of 4 cycles Administration: 0.25 mg (09/04/2019), 0.25 mg (09/18/2019), 0.25 mg (10/02/2019), 0.25 mg (10/16/2019) pegfilgrastim-cbqv (UDENYCA) injection 6 mg, 6 mg, Subcutaneous, Once, 4 of 4 cycles Administration: 6 mg (09/06/2019), 6 mg (09/20/2019), 6 mg (10/04/2019), 6 mg (10/18/2019) cyclophosphamide (CYTOXAN) 1,100 mg in sodium chloride 0.9 % 250 mL chemo infusion, 600 mg/m2 = 1,100 mg, Intravenous,  Once, 4 of 4 cycles Dose modification: 500 mg/m2 (original dose 600 mg/m2, Cycle 2, Reason: Provider Judgment) Administration: 1,100 mg (09/04/2019), 920 mg (09/18/2019), 920 mg (10/02/2019), 920 mg (10/16/2019) PACLitaxel (TAXOL) 144 mg in sodium chloride 0.9 % 250 mL chemo infusion (</= 11m/m2), 80 mg/m2 = 144 mg, Intravenous,  Once, 8 of 12 cycles Dose modification: 65 mg/m2 (original dose 80 mg/m2, Cycle 6, Reason: Dose not tolerated), 50 mg/m2 (original dose 80 mg/m2, Cycle 7, Reason: Provider Judgment) Administration: 144 mg (10/30/2019), 120 mg (11/06/2019), 90 mg (11/13/2019), 90 mg (11/20/2019), 90 mg (11/27/2019), 90 mg (12/04/2019), 90 mg (12/11/2019), 90 mg (12/18/2019) fosaprepitant (EMEND) 150 mg  in sodium chloride 0.9 % 145 mL IVPB, 150 mg, Intravenous,  Once, 4 of 4 cycles Administration: 150  mg (09/04/2019), 150 mg (09/18/2019), 150 mg (10/02/2019), 150 mg (10/16/2019)  for chemotherapy treatment.      CHIEF COMPLIANT: Cycle 9 Taxol (being stopped)  INTERVAL HISTORY: PERSAIS ETHRIDGE is a 20-year with above-mentioned history of very aggressive right breast cancer with extensive lymphadenopathy who is currently neoadjuvant chemotherapy and today is cycle 9 of Taxol.  Chemo will be held because of worsening neuropathy.  She cannot from the bottom half of her foot.  Otherwise she has no other side effects from treatment.   ALLERGIES:  is allergic to penicillins.  MEDICATIONS:  Current Outpatient Medications  Medication Sig Dispense Refill  . acetaminophen (TYLENOL) 325 MG tablet Take 650 mg by mouth every 6 (six) hours as needed. (Patient not taking: Reported on 12/18/2019)    . dexamethasone (DECADRON) 4 MG tablet Take 4 mg by mouth daily. (Patient not taking: Reported on 12/18/2019)    . ibuprofen (ADVIL) 800 MG tablet Take 1 tablet (800 mg total) by mouth 3 (three) times daily. (Patient not taking: Reported on 12/18/2019) 21 tablet 0  . lidocaine-prilocaine (EMLA) cream Apply to affected area once 30 g 3  . LORazepam (ATIVAN) 0.5 MG tablet Take 1 tablet (0.5 mg total) by mouth at bedtime as needed (Nausea or vomiting). (Patient not taking: Reported on 12/18/2019) 30 tablet 1  . ondansetron (ZOFRAN) 8 MG tablet Take 1 tablet (8 mg total) by mouth 2 (two) times daily as needed. Start on the third day after chemotherapy. (Patient not taking: Reported on 10/16/2019) 30 tablet 1  . prochlorperazine (COMPAZINE) 10 MG tablet Take 1 tablet (10 mg total) by mouth every 6 (six) hours as needed (Nausea or vomiting). (Patient not taking: Reported on 10/16/2019) 30 tablet 1  . traMADol (ULTRAM) 50 MG tablet Take 1 tablet (50 mg total) by mouth every 6 (six) hours as needed. (Patient not taking: Reported on 12/18/2019) 12 tablet 0   No current facility-administered medications for this visit.     PHYSICAL EXAMINATION: ECOG PERFORMANCE STATUS: 1 - Symptomatic but completely ambulatory  Vitals:   12/25/19 1155  BP: (!) 150/116  Pulse: 98  Resp: 20  Temp: 98.7 F (37.1 C)  SpO2: 100%   Filed Weights   12/25/19 1155  Weight: 155 lb 3.2 oz (70.4 kg)      LABORATORY DATA:  I have reviewed the data as listed CMP Latest Ref Rng & Units 12/18/2019 12/11/2019 12/04/2019  Glucose 70 - 99 mg/dL 90 101(H) 99  BUN 6 - 20 mg/dL 13 10 10   Creatinine 0.44 - 1.00 mg/dL 0.88 0.82 0.84  Sodium 135 - 145 mmol/L 140 140 140  Potassium 3.5 - 5.1 mmol/L 3.8 4.2 3.9  Chloride 98 - 111 mmol/L 107 108 106  CO2 22 - 32 mmol/L 24 24 24   Calcium 8.9 - 10.3 mg/dL 9.2 9.1 9.3  Total Protein 6.5 - 8.1 g/dL 7.2 6.6 6.5  Total Bilirubin 0.3 - 1.2 mg/dL 0.3 0.3 0.3  Alkaline Phos 38 - 126 U/L 65 59 62  AST 15 - 41 U/L 31 45(H) 22  ALT 0 - 44 U/L 33 50(H) 21    Lab Results  Component Value Date   WBC 2.5 (L) 12/18/2019   HGB 11.8 (L) 12/18/2019   HCT 34.4 (L) 12/18/2019   MCV 93.5 12/18/2019   PLT 227 12/18/2019   NEUTROABS 1.0 (L) 12/18/2019  ASSESSMENT & PLAN:  Malignant neoplasm of overlapping sites of right breast in female, estrogen receptor positive ([place]) 08/17/19:Patient palpated a right breast mass and right axilla mass and noted skin redness x2 weeks. She went to the ED and had an abscess drained with no improvement. Diagnostic mammogram and US showed a 7.3cm right breast mass extending into all 4 quadrants and partially into overlying skin with 12 abnormal right axillary lymph nodes. 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  Treatment plan: 1. Neoadjuvant chemotherapy with Adriamycin and Cytoxan dose dense 4 followed byTaxolweekly 8 stopped after cycle 8 for neuropathy 2. Followed by breastmastectomy and axillary lymph node dissection 3. Followed by adjuvant radiation  therapy 4.Follow-up adjuvant antiestrogen therapy  CT CAP 08/31/2019: Very large central right breast mass, bulky right axillary and subpectoral lymph nodes. No evidence of distant metastatic disease. Bulky uterine fibroids Bone scan 08/31/2019: No metastatic disease in the bones ---------------------------------------------------------------------------------------------------------------------------------------------------- Chemo toxicities: 1.  Peripheral neuropathy: Moderately severe.  I discussed with her that we need to stop chemotherapy at this time because of rapid progression of neuropathy.  We discussed that it can be irreversible if he continue with the treatment.  Based on risks and benefits best to stop further Taxol treatments.  Patient has appointment for breast MRI. Follow-up after that with the surgery to discuss mastectomy and ALN D. Patient is hoping that she can have a lumpectomy but I discussed with her that based on the original presentation we do not recommend that.  Return to clinic after surgery to discuss pathology report.    No orders of the defined types were placed in this encounter.  The patient has a good understanding of the overall plan. she agrees with it. she will call with any problems that may develop before the next visit here. Total time spent: 30 mins including face to face time and time spent for planning, charting and co-ordination of care   Harriette Ohara, MD 12/25/19

## 2019-12-25 NOTE — Telephone Encounter (Signed)
Received call from patient stating the tingling her feet have gotten worse since she was seen last week and she wasn't sure what to do.  Informed her if she could come in at 63 prior to getting labs and tx and see Dr. Lindi Adie to evaluate. She states she will do that but may be a little late as she relies on public transportation.

## 2019-12-26 ENCOUNTER — Other Ambulatory Visit: Payer: Self-pay | Admitting: Adult Health

## 2019-12-26 DIAGNOSIS — C50811 Malignant neoplasm of overlapping sites of right female breast: Secondary | ICD-10-CM

## 2019-12-28 ENCOUNTER — Other Ambulatory Visit: Payer: Medicaid Other

## 2019-12-28 ENCOUNTER — Ambulatory Visit (HOSPITAL_COMMUNITY): Admission: RE | Admit: 2019-12-28 | Payer: Medicaid Other | Source: Ambulatory Visit

## 2020-01-01 ENCOUNTER — Encounter: Payer: Self-pay | Admitting: *Deleted

## 2020-01-01 NOTE — Assessment & Plan Note (Deleted)
08/17/19:Patient palpated a right breast mass and right axilla mass and noted skin redness x2 weeks. She went to the ED and had an abscess drained with no improvement. Diagnostic mammogram and US showed a 7.3cm right breast mass extending into all 4 quadrants and partially into overlying skin with 12 abnormal right axillary lymph nodes. 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  Treatment plan: 1. Neoadjuvant chemotherapy with Adriamycin and Cytoxan dose dense 4 followed byTaxolweekly 8 stopped after cycle 8 for neuropathy 2. Followed by breastmastectomy and axillary lymph node dissection 3. Followed by adjuvant radiation therapy 4.Follow-up adjuvant antiestrogen therapy  CT CAP 08/31/2019: Very large central right breast mass, bulky right axillary and subpectoral lymph nodes. No evidence of distant metastatic disease. Bulky uterine fibroids Bone scan 08/31/2019: No metastatic disease in the bones ---------------------------------------------------------------------------------------------------------------------------------------------------- Peripheral neuropathy: Moderately severe.

## 2020-01-02 ENCOUNTER — Other Ambulatory Visit: Payer: Medicaid Other

## 2020-01-02 ENCOUNTER — Ambulatory Visit: Payer: Medicaid Other

## 2020-01-02 ENCOUNTER — Ambulatory Visit: Payer: Medicaid Other | Admitting: Hematology and Oncology

## 2020-01-07 ENCOUNTER — Telehealth: Payer: Self-pay | Admitting: *Deleted

## 2020-01-07 ENCOUNTER — Other Ambulatory Visit: Payer: Self-pay

## 2020-01-07 ENCOUNTER — Other Ambulatory Visit: Payer: Self-pay | Admitting: *Deleted

## 2020-01-07 ENCOUNTER — Ambulatory Visit
Admission: RE | Admit: 2020-01-07 | Discharge: 2020-01-07 | Disposition: A | Discharge status: Home / Self Care | Payer: Medicaid Other | Source: Ambulatory Visit | Attending: Adult Health | Admitting: Adult Health

## 2020-01-07 DIAGNOSIS — C50811 Malignant neoplasm of overlapping sites of right female breast: Secondary | ICD-10-CM

## 2020-01-07 MED ORDER — GABAPENTIN 100 MG PO CAPS
100.0000 mg | ORAL_CAPSULE | Freq: Every day | ORAL | 1 refills | Status: DC
Start: 2020-01-07 — End: 2020-01-08

## 2020-01-07 MED ORDER — GADOBUTROL 1 MMOL/ML IV SOLN
7.0000 mL | Freq: Once | INTRAVENOUS | Status: AC | PRN
  Administered 2020-01-07: 7 mL via INTRAVENOUS

## 2020-01-07 NOTE — Telephone Encounter (Signed)
Received call from patient stating she is having numbness and tingling in her feet and it's really bothering her.  Informed her that she we could call in some gabapentin for her to try.  Explained to start out taking 1 at night but could increase if needed.  Patient verbalize understanding and prescription sent to her pharmacy.

## 2020-01-07 NOTE — Telephone Encounter (Signed)
Received call from pt with complaint of worsening neuropathy in bilateral feet.  Pt requesting office apt to be examined by MD.  Apt scheduled for tomorrow at 1:30 pm.  Pt verbalized understanding of apt date and time.

## 2020-01-07 NOTE — Progress Notes (Signed)
Patient Care Team: Patient, [name] as PCP - General (General Practice) [name], RN as Oncology Nurse Navigator [name], RN as Oncology Nurse Navigator [name], MD as Consulting Physician (General Surgery) [name], MD as Consulting Physician (Hematology and Oncology) [name], MD as Consulting Physician (Radiation Oncology)  DIAGNOSIS:    ICD-10-CM   1. Malignant neoplasm of overlapping sites of right breast in female, estrogen receptor positive ([name])  C50.811    Z17.0     SUMMARY OF ONCOLOGIC HISTORY: Oncology History  Malignant neoplasm of overlapping sites of right breast in female, estrogen receptor positive ([name])  08/17/2019 Initial Diagnosis   Patient palpated a right breast mass and right axilla mass and noted skin redness x2 weeks. She went to the ED and had an abscess drained with no improvement. Diagnostic mammogram and US showed a 7.3cm right breast mass extending into all 4 quadrants and partially into overlying skin with 12 abnormal right axillary lymph nodes. Biopsy  showed IDC with necrosis in the breast and axilla, grade 3, HER-2 equivocal by IHC, negative by FISH, ER+ 40% weak, PR -, Ki67 90%.    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 The report date is 09/03/2019.  The Common Hereditary Cancers Panel offered by [facility] includes sequencing and/or deletion duplication testing of the following 48 genes: APC, ATM, AXIN2, BARD1, BMPR1A, BRCA1, BRCA2, BRIP1, CDH1, CDK4, CDKN2A (p14ARF), CDKN2A (p16INK4a), CHEK2, CTNNA1, DICER1, EPCAM (Deletion/duplication testing only), GREM1 (promoter region deletion/duplication testing only), KIT, MEN1, MLH1, MSH2, MSH3, MSH6, MUTYH, NBN, NF1, NHTL1, PALB2, PDGFRA, PMS2, POLD1, POLE, PTEN, RAD50, RAD51C, RAD51D, RNF43, SDHB, SDHC, SDHD, SMAD4, SMARCA4. STK11, TP53,  TSC1, TSC2, and VHL.  The following genes were evaluated for sequence changes only: SDHA and HOXB13 c.251G>A variant only.    09/04/2019 -  Chemotherapy   The patient had dexamethasone (DECADRON) 4 MG tablet, 4 mg (100 % of original dose 4 mg), Oral, Daily, 1 of 1 cycle, Start date: 09/11/2019, End date: 09/13/2019 Dose modification: 4 mg (original dose 4 mg, Cycle 0) DOXOrubicin (ADRIAMYCIN) chemo injection 110 mg, 60 mg/m2 = 110 mg, Intravenous,  Once, 4 of 4 cycles Dose modification: 50 mg/m2 (original dose 60 mg/m2, Cycle 2, Reason: Provider Judgment) Administration: 110 mg (09/04/2019), 92 mg (09/18/2019), 92 mg (10/02/2019), 92 mg (10/16/2019) palonosetron (ALOXI) injection 0.25 mg, 0.25 mg, Intravenous,  Once, 4 of 4 cycles Administration: 0.25 mg (09/04/2019), 0.25 mg (09/18/2019), 0.25 mg (10/02/2019), 0.25 mg (10/16/2019) pegfilgrastim-cbqv (UDENYCA) injection 6 mg, 6 mg, Subcutaneous, Once, 4 of 4 cycles Administration: 6 mg (09/06/2019), 6 mg (09/20/2019), 6 mg (10/04/2019), 6 mg (10/18/2019) cyclophosphamide (CYTOXAN) 1,100 mg in sodium chloride 0.9 % 250 mL chemo infusion, 600 mg/m2 = 1,100 mg, Intravenous,  Once, 4 of 4 cycles Dose modification: 500 mg/m2 (original dose 600 mg/m2, Cycle 2, Reason: Provider Judgment) Administration: 1,100 mg (09/04/2019), 920 mg (09/18/2019), 920 mg (10/02/2019), 920 mg (10/16/2019) PACLitaxel (TAXOL) 144 mg in sodium chloride 0.9 % 250 mL chemo infusion (</= 95m/m2), 80 mg/m2 = 144 mg, Intravenous,  Once, 8 of 12 cycles Dose modification: 65 mg/m2 (original dose 80 mg/m2, Cycle 6, Reason: Dose not tolerated), 50 mg/m2 (original dose 80 mg/m2, Cycle 7, Reason: Provider Judgment) Administration: 144 mg (10/30/2019), 120 mg (11/06/2019), 90 mg (11/13/2019), 90 mg (11/20/2019), 90 mg (11/27/2019), 90 mg (12/04/2019), 90 mg (12/11/2019), 90 mg (  12/18/2019) fosaprepitant (EMEND) 150 mg in sodium chloride 0.9 % 145 mL IVPB, 150 mg, Intravenous,  Once, 4 of 4 cycles Administration: 150 mg  (09/04/2019), 150 mg (09/18/2019), 150 mg (10/02/2019), 150 mg (10/16/2019)  for chemotherapy treatment.      CHIEF COMPLIANT: Follow-up of right breast cancer, review MRI  INTERVAL HISTORY: CINA Gina Ross is a 49 y.o. with above-mentioned history of right breast cancer who completed 8 cycles of neoadjuvant chemotherapy withweekly Taxol and four cycles ofdose dense Adriamycin and Cytoxan.Breast MRI on 01/07/20 showed treatment response with decreased size of right breast mass from 7.2cm previously to 4.0cm, decreased right axillary adenopathy, and decreased size of left breast focus. She presents to the clinic today to review her MRI and for follow-up of neuropathy in her bilateral feet.  The dorsum of her feet up to the toes she has neuropathy.  ALLERGIES:  is allergic to penicillins.  MEDICATIONS:  Current Outpatient Medications  Medication Sig Dispense Refill  . acetaminophen (TYLENOL) 325 MG tablet Take 650 mg by mouth every 6 (six) hours as needed. (Patient not taking: Reported on 12/18/2019)    . dexamethasone (DECADRON) 4 MG tablet Take 4 mg by mouth daily. (Patient not taking: Reported on 12/18/2019)    . gabapentin (NEURONTIN) 100 MG capsule Take 1 capsule (100 mg total) by mouth at bedtime. May increase to up to 3 capsules by mouth at bedtime if needed 90 capsule 1  . ibuprofen (ADVIL) 800 MG tablet Take 1 tablet (800 mg total) by mouth 3 (three) times daily. (Patient not taking: Reported on 12/18/2019) 21 tablet 0  . lidocaine-prilocaine (EMLA) cream Apply to affected area once 30 g 3  . LORazepam (ATIVAN) 0.5 MG tablet Take 1 tablet (0.5 mg total) by mouth at bedtime as needed (Nausea or vomiting). (Patient not taking: Reported on 12/18/2019) 30 tablet 1  . ondansetron (ZOFRAN) 8 MG tablet Take 1 tablet (8 mg total) by mouth 2 (two) times daily as needed. Start on the third day after chemotherapy. (Patient not taking: Reported on 10/16/2019) 30 tablet 1  . prochlorperazine (COMPAZINE) 10  MG tablet Take 1 tablet (10 mg total) by mouth every 6 (six) hours as needed (Nausea or vomiting). (Patient not taking: Reported on 10/16/2019) 30 tablet 1  . traMADol (ULTRAM) 50 MG tablet Take 1 tablet (50 mg total) by mouth every 6 (six) hours as needed. (Patient not taking: Reported on 12/18/2019) 12 tablet 0   No current facility-administered medications for this visit.    PHYSICAL EXAMINATION: ECOG PERFORMANCE STATUS: 1 - Symptomatic but completely ambulatory  Vitals:   01/08/20 1343  BP: 120/65  Pulse: (!) 109  Resp: 18  Temp: 99.1 F (37.3 C)  SpO2: 100%   Filed Weights   01/08/20 1343  Weight: 153 lb 1.6 oz (69.4 kg)    LABORATORY DATA:  I have reviewed the data as listed CMP Latest Ref Rng & Units 12/18/2019 12/11/2019 12/04/2019  Glucose 70 - 99 mg/dL 90 101(H) 99  BUN 6 - 20 mg/dL _0 Creatinine 0.44 - 1.00 mg/dL 0.88 0.82 0.84  Sodium 135 - 145 mmol/L 140 140 140  Potassium 3.5 - 5.1 mmol/L 3.8 4.2 3.9  Chloride 98 - 111 mmol/L 107 108 106  CO2 22 - 32 mmol/L _1 Calcium 8.9 - 10.3 mg/dL 9.2 9.1 9.3  Total Protein 6.5 - 8.1 g/dL 7.2 6.6 6.5  Total Bilirubin 0.3 - 1.2 mg/dL 0.3 0.3 0.3  Alkaline  Phos 38 - 126 U/L 65 59 62  AST 15 - 41 U/L 31 45(H) 22  ALT 0 - 44 U/L 33 50(H) 21    Lab Results  Component Value Date   WBC 2.5 (L) 12/18/2019   HGB 11.8 (L) 12/18/2019   HCT 34.4 (L) 12/18/2019   MCV 93.5 12/18/2019   PLT 227 12/18/2019   NEUTROABS 1.0 (L) 12/18/2019    ASSESSMENT & PLAN:  Malignant neoplasm of overlapping sites of right breast in female, estrogen receptor positive ([place]) 08/17/19:Patient palpated a right breast mass and right axilla mass and noted skin redness x2 weeks. She went to the ED and had an abscess drained with no improvement. Diagnostic mammogram and US showed a 7.3cm right breast mass extending into all 4 quadrants and partially into overlying skin with 12 abnormal right axillary lymph nodes. Biopsy showed IDC with  necrosis in the breast and axilla, grade 3, HER-2 equivocal by IHC, negative by FISH, ER+ 40% weak, PR -, Ki67 90%.  Treatment plan: 1. Neoadjuvant chemotherapy with Adriamycin and Cytoxan dose dense 4 followed byTaxolweekly 8 stopped after cycle 8 for neuropathy 2. Followed by breastmastectomy and axillary lymph node dissection 3. Followed by adjuvant radiation therapy 4.Follow-up adjuvant antiestrogen therapy  CT CAP 08/31/2019: Very large central right breast mass, bulky right axillary and subpectoral lymph nodes. No evidence of distant metastatic disease. Bulky uterine fibroids Bone scan 08/31/2019: No metastatic disease in the bones ---------------------------------------------------------------------------------------------------------------------------------------------------- Breast MRI: 01/07/2020: Treatment response with residual 3.5 x 4 cm ill-defined persistent non-mass-like enhancement at the site of the biopsy-proven malignancy.  Previous mass was 7.2 cm.  Normal right axillary lymph nodes.  Decreased size of the left breast focus.  I went over the breast MRI and showed her the pictures of before and after.  peripheral neuropathy: Patient has neuropathy in both her feet it feels like tingling and numbness but it is not causing her any pain or discomfort.  She was prescribed gabapentin at bedtime but she did not want to take it.  I discussed with her that if she were to become symptomatic and it would keep her from sleeping then she might want to consider taking it.  She also has hot flashes.  Gabapentin could also improve hot flashes.  She will consider it if necessary. She does not think the neuropathy is any worse.  She was just not sure why it had not gone away a week or 2 after chemo was completed.  I told her that it could take 6 months to year for her to have improvement in the neuropathy.   Return to clinic after surgery to discuss pathology report.   No orders of the  defined types were placed in this encounter.  The patient has a good understanding of the overall plan. she agrees with it. she will call with any problems that may develop before the next visit here.  Total time spent: 30 mins including face to face time and time spent for planning, charting and coordination of care  [name], MD 01/08/2020  I, Cloyde Reams Dorshimer, am acting as scribe for Dr. [name].  I have reviewed the above documentation for accuracy and completeness, and I agree with the above.

## 2020-01-08 ENCOUNTER — Ambulatory Visit: Payer: Medicaid Other

## 2020-01-08 ENCOUNTER — Other Ambulatory Visit: Payer: Self-pay

## 2020-01-08 ENCOUNTER — Inpatient Hospital Stay: Payer: Medicaid Other | Attending: Hematology and Oncology | Admitting: Hematology and Oncology

## 2020-01-08 ENCOUNTER — Other Ambulatory Visit: Payer: Medicaid Other

## 2020-01-08 ENCOUNTER — Telehealth: Payer: Self-pay | Admitting: Hematology and Oncology

## 2020-01-08 ENCOUNTER — Encounter: Payer: Self-pay | Admitting: *Deleted

## 2020-01-08 DIAGNOSIS — C773 Secondary and unspecified malignant neoplasm of axilla and upper limb lymph nodes: Secondary | ICD-10-CM | POA: Insufficient documentation

## 2020-01-08 DIAGNOSIS — C50811 Malignant neoplasm of overlapping sites of right female breast: Secondary | ICD-10-CM | POA: Diagnosis present

## 2020-01-08 DIAGNOSIS — N951 Menopausal and female climacteric states: Secondary | ICD-10-CM | POA: Diagnosis not present

## 2020-01-08 DIAGNOSIS — Z170 Estrogen receptor positive status [ER+]: Secondary | ICD-10-CM | POA: Diagnosis not present

## 2020-01-08 DIAGNOSIS — Z9221 Personal history of antineoplastic chemotherapy: Secondary | ICD-10-CM | POA: Diagnosis not present

## 2020-01-08 DIAGNOSIS — G620 Drug-induced polyneuropathy: Secondary | ICD-10-CM | POA: Diagnosis not present

## 2020-01-08 NOTE — Assessment & Plan Note (Signed)
08/17/19:Patient palpated a right breast mass and right axilla mass and noted skin redness x2 weeks. She went to the ED and had an abscess drained with no improvement. Diagnostic mammogram and US showed a 7.3cm right breast mass extending into all 4 quadrants and partially into overlying skin with 12 abnormal right axillary lymph nodes. 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  Treatment plan: 1. Neoadjuvant chemotherapy with Adriamycin and Cytoxan dose dense 4 followed byTaxolweekly 8 stopped after cycle 8 for neuropathy 2. Followed by breastmastectomy and axillary lymph node dissection 3. Followed by adjuvant radiation therapy 4.Follow-up adjuvant antiestrogen therapy  CT CAP 08/31/2019: Very large central right breast mass, bulky right axillary and subpectoral lymph nodes. No evidence of distant metastatic disease. Bulky uterine fibroids Bone scan 08/31/2019: No metastatic disease in the bones ---------------------------------------------------------------------------------------------------------------------------------------------------- Breast MRI: 01/07/2020: Treatment response with residual 3.5 x 4 cm ill-defined persistent non-mass-like enhancement at the site of the biopsy-proven malignancy.  Previous mass was 7.2 cm.  Normal right axillary lymph nodes.  Decreased size of the left breast focus.  Worsening peripheral neuropathy: Started on gabapentin.  I discussed with her that time will improve her symptoms slowly.  Gabapentin is meant to decrease the discomfort associated with the neuropathy.

## 2020-01-08 NOTE — Telephone Encounter (Signed)
Scheduled appt per 7/13 sch msg - pt is aware of appt date and time

## 2020-01-09 ENCOUNTER — Telehealth: Payer: Self-pay | Admitting: Hematology and Oncology

## 2020-01-09 NOTE — Telephone Encounter (Signed)
No 7/13 los, no changes made to pt schedule 

## 2020-01-15 ENCOUNTER — Ambulatory Visit: Payer: Medicaid Other

## 2020-01-15 ENCOUNTER — Ambulatory Visit: Payer: Medicaid Other | Admitting: Adult Health

## 2020-01-15 ENCOUNTER — Other Ambulatory Visit: Payer: Medicaid Other

## 2020-01-15 NOTE — Pre-Procedure Instructions (Signed)
TEIGHAN AUBERT  01/15/2020    Your procedure is scheduled on Thursday, January 24, 2020 at 9:00 AM.   Report to [facility] Entrance "A" Admitting Office at 7:00 AM.   Call this number if you have problems the morning of surgery: [phone]   Questions prior to day of surgery, please call [phone] between 8 & 4 PM.   Remember:  Do not eat food after midnight Wednesday, 01/23/20.  You may drink clear liquids until 6:00 AM.  Clear liquids allowed are: Water, Carbonated beverages - (diabetics please choose diet or no sugar options), Clear Tea, Black Coffee only (no creamer, milk or cream including half and half) and Gatorade (diabetics please choose diet or no sugar options)   Drink the Pre-Surgery Ensure between 5:45 AM and 6:00 AM. This will be the last liquid you will have prior to surgery.    Take these medicines the morning of surgery with A SIP OF WATER: Acetaminophen (Tylenol) - if needed, Cetirizine (Zyrtec) - if needed.  Do NOT smoke 24 hours prior to surgery (includes cigarettes and marijuana).    Do not wear jewelry, make-up or nail polish.  Do not wear lotions, powders, perfumes or deodorant.  Do not shave 48 hours prior to surgery.   Do not bring valuables to the hospital.  [facility] is not responsible for any belongings or valuables.  Contacts, dentures or bridgework may not be worn into surgery.  Leave your suitcase in the car.  After surgery it may be brought to your room.  For patients admitted to the hospital, discharge time will be determined by your treatment team.  Patients discharged the day of surgery will not be allowed to drive home.   [place] - Preparing for Surgery  Before surgery, you can play an important role.  Because skin is not sterile, your skin needs to be as free of germs as possible.  You can reduce the number of germs on you skin by washing with CHG (chlorahexidine gluconate) soap before surgery.  CHG is an antiseptic cleaner  which kills germs and bonds with the skin to continue killing germs even after washing.  Oral Hygiene is also important in reducing the risk of infection.  Remember to brush your teeth with your regular toothpaste the morning of surgery.  Please DO NOT use if you have an allergy to CHG or antibacterial soaps.  If your skin becomes reddened/irritated stop using the CHG and inform your nurse when you arrive at Short Stay.  Do not shave (including legs and underarms) for at least 48 hours prior to the first CHG shower.  You may shave your face.  Please follow these instructions carefully:   1.  Shower with CHG Soap the night before surgery and the morning of Surgery.  2.  If you choose to wash your hair, wash your hair first as usual with your normal shampoo.  3.  After you shampoo, rinse your hair and body thoroughly to remove the shampoo. 4.  Use CHG as you would any other liquid soap.  You can apply chg directly to the skin and wash gently with a      scrungie or washcloth.           5.  Apply the CHG Soap to your body ONLY FROM THE NECK DOWN.   Do not use on open wounds or open sores. Avoid contact with your eyes, ears, mouth and genitals (private parts).  Wash genitals (private  parts) with your normal soap - do this prior to using soap  6.  Wash thoroughly, paying special attention to the area where your surgery will be performed.  7.  Thoroughly rinse your body with warm water from the neck down.  8.  DO NOT shower/wash with your normal soap after using and rinsing off the CHG Soap.  9.  Pat yourself dry with a clean towel.            10.  Wear clean pajamas.            11.  Place clean sheets on your bed the night of your first shower and do not sleep with pets.  Day of Surgery  Shower as above. Do not apply any lotions/deodorants the morning of surgery.   Please wear clean clothes to the hospital. Remember to brush your teeth with toothpaste.  Please read over the fact sheets that  you were given.

## 2020-01-16 ENCOUNTER — Inpatient Hospital Stay (HOSPITAL_COMMUNITY)
Admission: RE | Admit: 2020-01-16 | Discharge: 2020-01-16 | Disposition: A | Discharge status: Home / Self Care | Payer: Medicaid Other | Source: Ambulatory Visit

## 2020-01-17 ENCOUNTER — Encounter (HOSPITAL_COMMUNITY)
Admission: RE | Admit: 2020-01-17 | Discharge: 2020-01-17 | Disposition: A | Discharge status: Home / Self Care | Payer: Medicaid Other | Source: Ambulatory Visit | Attending: General Surgery | Admitting: General Surgery

## 2020-01-17 ENCOUNTER — Other Ambulatory Visit: Payer: Self-pay

## 2020-01-17 ENCOUNTER — Encounter: Payer: Self-pay | Admitting: *Deleted

## 2020-01-17 ENCOUNTER — Encounter (HOSPITAL_COMMUNITY): Payer: Self-pay

## 2020-01-17 DIAGNOSIS — Z01812 Encounter for preprocedural laboratory examination: Secondary | ICD-10-CM | POA: Diagnosis present

## 2020-01-17 LAB — COMPREHENSIVE METABOLIC PANEL
ALT: 52 U/L — ABNORMAL HIGH (ref 0–44)
AST: 37 U/L (ref 15–41)
Albumin: 4 g/dL (ref 3.5–5.0)
Alkaline Phosphatase: 58 U/L (ref 38–126)
Anion gap: 10 (ref 5–15)
BUN: 11 mg/dL (ref 6–20)
CO2: 24 mmol/L (ref 22–32)
Calcium: 9.3 mg/dL (ref 8.9–10.3)
Chloride: 107 mmol/L (ref 98–111)
Creatinine, Ser: 0.98 mg/dL (ref 0.44–1.00)
GFR calc Af Amer: >60 mL/min (ref >60)
GFR calc non Af Amer: >60 mL/min (ref >60)
Glucose, Bld: 94 mg/dL (ref 70–99)
Potassium: 3.8 mmol/L (ref 3.5–5.1)
Sodium: 141 mmol/L (ref 135–145)
Total Bilirubin: 0.6 mg/dL (ref 0.3–1.2)
Total Protein: 7 g/dL (ref 6.5–8.1)

## 2020-01-17 LAB — CBC
HCT: 39.3 % (ref 36.0–46.0)
Hemoglobin: 13.1 g/dL (ref 12.0–15.0)
MCH: 31.2 pg (ref 26.0–34.0)
MCHC: 33.3 g/dL (ref 30.0–36.0)
MCV: 93.6 fL (ref 80.0–100.0)
Platelets: 254 10*3/uL (ref 150–400)
RBC: 4.2 MIL/uL (ref 3.87–5.11)
RDW: 11.9 % (ref 11.5–15.5)
WBC: 3.4 10*3/uL — ABNORMAL LOW (ref 4.0–10.5)
nRBC: 0 % (ref 0.0–0.2)

## 2020-01-17 NOTE — Progress Notes (Signed)
PCP - denies  Chest x-ray - n/a EKG - n/a  ERAS Protcol - yes, Ensure given   COVID TEST- Monday 01-21-20   Anesthesia review: n/a  Patient denies shortness of breath, fever, cough and chest pain at PAT appointment   All instructions explained to the patient, with a verbal understanding of the material. Patient agrees to go over the instructions while at home for a better understanding. Patient also instructed to self quarantine after being tested for COVID-19. The opportunity to ask questions was provided.

## 2020-01-17 NOTE — Progress Notes (Signed)
[place] ([place]), [place] [address] [place] ([place]) [place] [address] Phone: [phone] Fax: [phone]  [place], [place] - [address] [place] [address] Phone: [phone] Fax: [phone]  [place] ([place]), [place] - [address] [place]) [place] [address] Phone: [phone] Fax: [phone]      Your procedure is scheduled on Thursday, July 29th.  Report to [facility] Main Entrance "A" at 7:00 A.M., and check in at the Admitting office.  Call this number if you have problems the morning of surgery:  [phone]  Call [phone] if you have any questions prior to your surgery date Monday-Friday 8am-4pm    Remember:  Do not eat after midnight the night before your surgery  You may drink clear liquids until 6:00 AM the morning of your surgery.   Clear liquids allowed are: Water, Non-Citrus Juices (without pulp), Carbonated Beverages, Clear Tea, Black Coffee Only, and Gatorade    Take these medicines the morning of surgery with A SIP OF WATER   Tylenol - if needed  Zyrtec - if needed  As of today, STOP taking any Aspirin (unless otherwise instructed by your surgeon) Aleve, Naproxen, Ibuprofen, Motrin, Advil, Goody's, BC's, all herbal medications, fish oil, and all vitamins.                      Do not wear jewelry, make up, or nail polish            Do not wear lotions, powders, perfumes, or deodorant.            Do not shave 48 hours prior to surgery.              Do not bring valuables to the hospital.            [facility] is not responsible for any belongings or valuables.  Do NOT Smoke (Tobacco/Vaping/Marijuana) or drink Alcohol 24 hours prior to your procedure If you use a CPAP at night, you may bring all equipment for your overnight stay.   Contacts, glasses, dentures or bridgework  may not be worn into surgery.      For patients admitted to the hospital, discharge time will be determined by your treatment team.   Patients discharged the day of surgery will not be allowed to drive home, and someone needs to stay with them for 24 hours.    Special instructions:   [place]- Preparing For Surgery  Before surgery, you can play an important role. Because skin is not sterile, your skin needs to be as free of germs as possible. You can reduce the number of germs on your skin by washing with CHG (chlorahexidine gluconate) Soap before surgery.  CHG is an antiseptic cleaner which kills germs and bonds with the skin to continue killing germs even after washing.    Oral Hygiene is also important to reduce your risk of infection.  Remember - BRUSH YOUR TEETH THE MORNING OF SURGERY WITH YOUR REGULAR TOOTHPASTE  Please do not use if you have an allergy to CHG or antibacterial soaps. If your skin becomes reddened/irritated stop using the CHG.  Do not shave (including legs and underarms) for at least 48 hours prior to first CHG shower. It is OK to shave your face.  Please follow these instructions carefully.   1. Shower the  NIGHT BEFORE SURGERY and the MORNING OF SURGERY with CHG Soap.   2. If you chose to wash your hair, wash your hair first as usual with your normal shampoo.  3. After you shampoo, rinse your hair and body thoroughly to remove the shampoo.  4. Use CHG as you would any other liquid soap. You can apply CHG directly to the skin and wash gently with a scrungie or a clean washcloth.   5. Apply the CHG Soap to your body ONLY FROM THE NECK DOWN.  Do not use on open wounds or open sores. Avoid contact with your eyes, ears, mouth and genitals (private parts). Wash Face and genitals (private parts)  with your normal soap.   6. Wash thoroughly, paying special attention to the area where your surgery will be performed.  7. Thoroughly rinse your body with warm water from  the neck down.  8. DO NOT shower/wash with your normal soap after using and rinsing off the CHG Soap.  9. Pat yourself dry with a CLEAN TOWEL.  10. Wear CLEAN PAJAMAS to bed the night before surgery  11. Place CLEAN SHEETS on your bed the night of your first shower and DO NOT SLEEP WITH PETS.   Day of Surgery: Wear Clean/Comfortable clothing the morning of surgery Do not apply any deodorants/lotions.   Remember to brush your teeth WITH YOUR REGULAR TOOTHPASTE.   Please read over the following fact sheets that you were given.

## 2020-01-21 ENCOUNTER — Other Ambulatory Visit (HOSPITAL_COMMUNITY)
Admission: RE | Admit: 2020-01-21 | Discharge: 2020-01-21 | Disposition: A | Discharge status: Home / Self Care | Payer: Medicaid Other | Source: Ambulatory Visit | Attending: General Surgery | Admitting: General Surgery

## 2020-01-21 ENCOUNTER — Telehealth: Payer: Self-pay | Admitting: Licensed Clinical Social Worker

## 2020-01-21 DIAGNOSIS — Z01812 Encounter for preprocedural laboratory examination: Secondary | ICD-10-CM | POA: Insufficient documentation

## 2020-01-21 DIAGNOSIS — Z20822 Contact with and (suspected) exposure to covid-19: Secondary | ICD-10-CM | POA: Diagnosis not present

## 2020-01-21 LAB — SARS CORONAVIRUS 2 (TAT 6-24 HRS): SARS Coronavirus 2: NEGATIVE

## 2020-01-21 NOTE — Telephone Encounter (Signed)
[place] Work  Received TC from patient asking about transportation options to non-cancer center appointments as well as about knitted knockers. CSW mailed knitted knockers today.  For transportation, CSW informed that through patient's Medicaid, she should have a transportation benefit. Recommended calling number on insurance card for more information.    Edwinna Areola Lavere Stork, LCSW

## 2020-01-24 ENCOUNTER — Other Ambulatory Visit: Payer: Self-pay

## 2020-01-24 ENCOUNTER — Observation Stay (HOSPITAL_COMMUNITY)
Admission: RE | Admit: 2020-01-24 | Discharge: 2020-01-25 | Disposition: A | Discharge status: Home / Self Care | Payer: Medicaid Other | Attending: General Surgery | Admitting: General Surgery

## 2020-01-24 ENCOUNTER — Encounter (HOSPITAL_COMMUNITY): Payer: Self-pay | Admitting: General Surgery

## 2020-01-24 ENCOUNTER — Ambulatory Visit (HOSPITAL_COMMUNITY): Payer: Medicaid Other | Admitting: Certified Registered Nurse Anesthetist

## 2020-01-24 ENCOUNTER — Ambulatory Visit (HOSPITAL_COMMUNITY): Payer: Medicaid Other | Admitting: Vascular Surgery

## 2020-01-24 ENCOUNTER — Encounter (HOSPITAL_COMMUNITY)
Admission: RE | Disposition: A | Discharge status: Home / Self Care | Payer: Self-pay | Source: Home / Self Care | Attending: General Surgery

## 2020-01-24 DIAGNOSIS — Z1501 Genetic susceptibility to malignant neoplasm of breast: Secondary | ICD-10-CM | POA: Insufficient documentation

## 2020-01-24 DIAGNOSIS — C50911 Malignant neoplasm of unspecified site of right female breast: Principal | ICD-10-CM | POA: Insufficient documentation

## 2020-01-24 DIAGNOSIS — J45909 Unspecified asthma, uncomplicated: Secondary | ICD-10-CM | POA: Insufficient documentation

## 2020-01-24 DIAGNOSIS — F17210 Nicotine dependence, cigarettes, uncomplicated: Secondary | ICD-10-CM | POA: Insufficient documentation

## 2020-01-24 HISTORY — PX: MASTECTOMY WITH AXILLARY LYMPH NODE DISSECTION: SHX5661

## 2020-01-24 LAB — POCT PREGNANCY, URINE: Preg Test, Ur: NEGATIVE

## 2020-01-24 SURGERY — MASTECTOMY WITH AXILLARY LYMPH NODE DISSECTION
Anesthesia: General | Site: Breast | Laterality: Right

## 2020-01-24 MED ORDER — LORATADINE 10 MG PO TABS
10.0000 mg | ORAL_TABLET | Freq: Every day | ORAL | Status: DC
  Administered 2020-01-24 – 2020-01-25 (×2): 10 mg via ORAL
  Filled 2020-01-24 (×2): qty 1

## 2020-01-24 MED ORDER — ACETAMINOPHEN 500 MG PO TABS: 1000.0000 mg | ORAL_TABLET | ORAL | Status: AC

## 2020-01-24 MED ORDER — FENTANYL CITRATE (PF) 100 MCG/2ML IJ SOLN
INTRAMUSCULAR | Status: AC
  Administered 2020-01-24: 50 ug via INTRAVENOUS
  Filled 2020-01-24: qty 2

## 2020-01-24 MED ORDER — DEXAMETHASONE SODIUM PHOSPHATE 10 MG/ML IJ SOLN
INTRAMUSCULAR | Status: DC | PRN
  Administered 2020-01-24: 10 mg via INTRAVENOUS

## 2020-01-24 MED ORDER — CHLORHEXIDINE GLUCONATE 0.12 % MT SOLN
OROMUCOSAL | Status: AC
  Administered 2020-01-24: 15 mL via OROMUCOSAL
  Filled 2020-01-24: qty 15

## 2020-01-24 MED ORDER — FENTANYL CITRATE (PF) 250 MCG/5ML IJ SOLN
INTRAMUSCULAR | Status: AC
  Filled 2020-01-24: qty 5

## 2020-01-24 MED ORDER — ONDANSETRON HCL 4 MG/2ML IJ SOLN: 4.0000 mg | Freq: Four times a day (QID) | INTRAMUSCULAR | Status: DC | PRN

## 2020-01-24 MED ORDER — ONDANSETRON HCL 4 MG/2ML IJ SOLN
INTRAMUSCULAR | Status: AC
  Filled 2020-01-24: qty 2

## 2020-01-24 MED ORDER — KETOROLAC TROMETHAMINE 15 MG/ML IJ SOLN
INTRAMUSCULAR | Status: AC
  Administered 2020-01-24: 15 mg via INTRAVENOUS
  Filled 2020-01-24: qty 1

## 2020-01-24 MED ORDER — ACETAMINOPHEN 10 MG/ML IV SOLN: 1000.0000 mg | Freq: Once | INTRAVENOUS | Status: DC | PRN

## 2020-01-24 MED ORDER — FENTANYL CITRATE (PF) 100 MCG/2ML IJ SOLN
25.0000 ug | INTRAMUSCULAR | Status: DC | PRN
  Administered 2020-01-24 (×3): 50 ug via INTRAVENOUS

## 2020-01-24 MED ORDER — DEXAMETHASONE SODIUM PHOSPHATE 10 MG/ML IJ SOLN
INTRAMUSCULAR | Status: AC
  Filled 2020-01-24: qty 2

## 2020-01-24 MED ORDER — MIDAZOLAM HCL 2 MG/2ML IJ SOLN: 2.0000 mg | Freq: Once | INTRAMUSCULAR | Status: DC

## 2020-01-24 MED ORDER — CHLORHEXIDINE GLUCONATE 0.12 % MT SOLN: 15.0000 mL | Freq: Once | OROMUCOSAL | Status: AC

## 2020-01-24 MED ORDER — HEMOSTATIC AGENTS (NO CHARGE) OPTIME
TOPICAL | Status: DC | PRN
  Administered 2020-01-24 (×2): 1 via TOPICAL

## 2020-01-24 MED ORDER — 0.9 % SODIUM CHLORIDE (POUR BTL) OPTIME
TOPICAL | Status: DC | PRN
  Administered 2020-01-24: 1000 mL

## 2020-01-24 MED ORDER — LIDOCAINE 2% (20 MG/ML) 5 ML SYRINGE
INTRAMUSCULAR | Status: DC | PRN
  Administered 2020-01-24: 60 mg via INTRAVENOUS

## 2020-01-24 MED ORDER — METHOCARBAMOL 500 MG PO TABS
500.0000 mg | ORAL_TABLET | Freq: Three times a day (TID) | ORAL | Status: DC
  Administered 2020-01-24 – 2020-01-25 (×3): 500 mg via ORAL
  Filled 2020-01-24 (×3): qty 1

## 2020-01-24 MED ORDER — CIPROFLOXACIN IN D5W 400 MG/200ML IV SOLN
INTRAVENOUS | Status: AC
  Filled 2020-01-24: qty 200

## 2020-01-24 MED ORDER — STERILE WATER FOR IRRIGATION IR SOLN
Status: DC | PRN
  Administered 2020-01-24: 1000 mL

## 2020-01-24 MED ORDER — TRAMADOL HCL 50 MG PO TABS
50.0000 mg | ORAL_TABLET | Freq: Four times a day (QID) | ORAL | Status: DC | PRN
  Administered 2020-01-24: 50 mg via ORAL
  Filled 2020-01-24: qty 1

## 2020-01-24 MED ORDER — MEPERIDINE HCL 25 MG/ML IJ SOLN: 6.2500 mg | INTRAMUSCULAR | Status: DC | PRN

## 2020-01-24 MED ORDER — ACETAMINOPHEN 160 MG/5ML PO SOLN: 325.0000 mg | Freq: Once | ORAL | Status: DC | PRN

## 2020-01-24 MED ORDER — MORPHINE SULFATE (PF) 2 MG/ML IV SOLN: 1.0000 mg | INTRAVENOUS | Status: DC | PRN

## 2020-01-24 MED ORDER — SIMETHICONE 80 MG PO CHEW: 40.0000 mg | CHEWABLE_TABLET | Freq: Four times a day (QID) | ORAL | Status: DC | PRN

## 2020-01-24 MED ORDER — MIDAZOLAM HCL 2 MG/2ML IJ SOLN
INTRAMUSCULAR | Status: AC
  Administered 2020-01-24: 2 mg
  Filled 2020-01-24: qty 2

## 2020-01-24 MED ORDER — ORAL CARE MOUTH RINSE: 15.0000 mL | Freq: Once | OROMUCOSAL | Status: AC

## 2020-01-24 MED ORDER — LACTATED RINGERS IV SOLN: INTRAVENOUS | Status: DC

## 2020-01-24 MED ORDER — ENSURE PRE-SURGERY PO LIQD: 296.0000 mL | Freq: Once | ORAL | Status: DC

## 2020-01-24 MED ORDER — SODIUM CHLORIDE 0.9 % IV SOLN: INTRAVENOUS | Status: DC

## 2020-01-24 MED ORDER — BUPIVACAINE-EPINEPHRINE (PF) 0.5% -1:200000 IJ SOLN
INTRAMUSCULAR | Status: DC | PRN
Start: 2020-01-24 — End: 2020-01-24
  Administered 2020-01-24: 30 mL

## 2020-01-24 MED ORDER — FENTANYL CITRATE (PF) 100 MCG/2ML IJ SOLN
INTRAMUSCULAR | Status: AC
  Filled 2020-01-24: qty 2

## 2020-01-24 MED ORDER — ACETAMINOPHEN 325 MG PO TABS: 325.0000 mg | ORAL_TABLET | Freq: Once | ORAL | Status: DC | PRN

## 2020-01-24 MED ORDER — PROMETHAZINE HCL 25 MG/ML IJ SOLN: 6.2500 mg | INTRAMUSCULAR | Status: DC | PRN

## 2020-01-24 MED ORDER — FENTANYL CITRATE (PF) 250 MCG/5ML IJ SOLN
INTRAMUSCULAR | Status: DC | PRN
  Administered 2020-01-24 (×7): 25 ug via INTRAVENOUS

## 2020-01-24 MED ORDER — PROPOFOL 10 MG/ML IV BOLUS
INTRAVENOUS | Status: AC
  Filled 2020-01-24: qty 20

## 2020-01-24 MED ORDER — ONDANSETRON 4 MG PO TBDP: 4.0000 mg | ORAL_TABLET | Freq: Four times a day (QID) | ORAL | Status: DC | PRN

## 2020-01-24 MED ORDER — CIPROFLOXACIN IN D5W 400 MG/200ML IV SOLN
400.0000 mg | INTRAVENOUS | Status: AC
  Administered 2020-01-24: 400 mg via INTRAVENOUS

## 2020-01-24 MED ORDER — OXYCODONE HCL 5 MG PO TABS
5.0000 mg | ORAL_TABLET | ORAL | Status: DC | PRN
  Administered 2020-01-24: 10 mg via ORAL
  Administered 2020-01-24: 5 mg via ORAL
  Administered 2020-01-24 – 2020-01-25 (×3): 10 mg via ORAL
  Filled 2020-01-24 (×5): qty 2

## 2020-01-24 MED ORDER — PROPOFOL 10 MG/ML IV BOLUS
INTRAVENOUS | Status: DC | PRN
  Administered 2020-01-24: 140 mg via INTRAVENOUS

## 2020-01-24 MED ORDER — FENTANYL CITRATE (PF) 100 MCG/2ML IJ SOLN: 50.0000 ug | Freq: Once | INTRAMUSCULAR | Status: AC

## 2020-01-24 MED ORDER — PHENYLEPHRINE 40 MCG/ML (10ML) SYRINGE FOR IV PUSH (FOR BLOOD PRESSURE SUPPORT)
PREFILLED_SYRINGE | INTRAVENOUS | Status: DC | PRN
  Administered 2020-01-24 (×2): 80 ug via INTRAVENOUS

## 2020-01-24 MED ORDER — GABAPENTIN 100 MG PO CAPS: 100.0000 mg | ORAL_CAPSULE | ORAL | Status: AC

## 2020-01-24 MED ORDER — ACETAMINOPHEN 500 MG PO TABS
1000.0000 mg | ORAL_TABLET | Freq: Four times a day (QID) | ORAL | Status: DC
  Administered 2020-01-24 – 2020-01-25 (×3): 1000 mg via ORAL
  Filled 2020-01-24 (×3): qty 2

## 2020-01-24 MED ORDER — LIDOCAINE 2% (20 MG/ML) 5 ML SYRINGE
INTRAMUSCULAR | Status: AC
  Filled 2020-01-24: qty 5

## 2020-01-24 MED ORDER — GABAPENTIN 100 MG PO CAPS
100.0000 mg | ORAL_CAPSULE | Freq: Every evening | ORAL | Status: DC | PRN
  Administered 2020-01-24: 100 mg via ORAL
  Filled 2020-01-24: qty 1

## 2020-01-24 MED ORDER — KETOROLAC TROMETHAMINE 15 MG/ML IJ SOLN: 15.0000 mg | INTRAMUSCULAR | Status: AC

## 2020-01-24 MED ORDER — LACTATED RINGERS IV SOLN: INTRAVENOUS | Status: DC | PRN

## 2020-01-24 MED ORDER — GABAPENTIN 100 MG PO CAPS
ORAL_CAPSULE | ORAL | Status: AC
  Administered 2020-01-24: 100 mg via ORAL
  Filled 2020-01-24: qty 1

## 2020-01-24 MED ORDER — ACETAMINOPHEN 500 MG PO TABS
ORAL_TABLET | ORAL | Status: AC
  Administered 2020-01-24: 1000 mg via ORAL
  Filled 2020-01-24: qty 2

## 2020-01-24 MED ORDER — ONDANSETRON HCL 4 MG/2ML IJ SOLN
INTRAMUSCULAR | Status: DC | PRN
  Administered 2020-01-24: 4 mg via INTRAVENOUS

## 2020-01-24 SURGICAL SUPPLY — 57 items
ADH SKN CLS APL DERMABOND .7 (GAUZE/BANDAGES/DRESSINGS) ×1
APL PRP STRL LF DISP 70% ISPRP (MISCELLANEOUS) ×1
APPLIER CLIP 9.375 MED OPEN (MISCELLANEOUS) ×6
APR CLP MED 9.3 20 MLT OPN (MISCELLANEOUS) ×2
BINDER BREAST XLRG (GAUZE/BANDAGES/DRESSINGS) ×2 IMPLANT
BIOPATCH RED 1 DISK 7.0 (GAUZE/BANDAGES/DRESSINGS) ×2 IMPLANT
BIOPATCH RED 1IN DISK 7.0MM (GAUZE/BANDAGES/DRESSINGS) ×2
CANISTER SUCT 3000ML PPV (MISCELLANEOUS) ×3 IMPLANT
CHLORAPREP W/TINT 26 (MISCELLANEOUS) ×3 IMPLANT
CLIP APPLIE 9.375 MED OPEN (MISCELLANEOUS) IMPLANT
CLOSURE WOUND 1/2 X4 (GAUZE/BANDAGES/DRESSINGS) ×2
CNTNR URN SCR LID CUP LEK RST (MISCELLANEOUS) ×1 IMPLANT
CONT SPEC 4OZ STRL OR WHT (MISCELLANEOUS) ×3
COVER SURGICAL LIGHT HANDLE (MISCELLANEOUS) ×3 IMPLANT
COVER WAND RF STERILE (DRAPES) ×3 IMPLANT
DERMABOND ADVANCED (GAUZE/BANDAGES/DRESSINGS) ×2
DERMABOND ADVANCED .7 DNX12 (GAUZE/BANDAGES/DRESSINGS) ×1 IMPLANT
DRAIN CHANNEL 19F RND (DRAIN) ×5 IMPLANT
DRAPE LAPAROSCOPIC ABDOMINAL (DRAPES) ×3 IMPLANT
DRSG PAD ABDOMINAL 8X10 ST (GAUZE/BANDAGES/DRESSINGS) ×3 IMPLANT
ELECT BLADE 4.0 EZ CLEAN MEGAD (MISCELLANEOUS) ×3
ELECT CAUTERY BLADE 6.4 (BLADE) ×3 IMPLANT
ELECT REM PT RETURN 9FT ADLT (ELECTROSURGICAL) ×3
ELECTRODE BLDE 4.0 EZ CLN MEGD (MISCELLANEOUS) ×1 IMPLANT
ELECTRODE REM PT RTRN 9FT ADLT (ELECTROSURGICAL) ×1 IMPLANT
EVACUATOR SILICONE 100CC (DRAIN) ×6 IMPLANT
GAUZE SPONGE 4X4 12PLY STRL (GAUZE/BANDAGES/DRESSINGS) ×3 IMPLANT
GLOVE BIO SURGEON STRL SZ7 (GLOVE) ×3 IMPLANT
GLOVE BIOGEL PI IND STRL 7.5 (GLOVE) ×1 IMPLANT
GLOVE BIOGEL PI INDICATOR 7.5 (GLOVE) ×2
GOWN STRL REUS W/ TWL LRG LVL3 (GOWN DISPOSABLE) ×3 IMPLANT
GOWN STRL REUS W/TWL LRG LVL3 (GOWN DISPOSABLE) ×9
HEMOSTAT ARISTA ABSORB 3G PWDR (HEMOSTASIS) ×4 IMPLANT
KIT BASIN OR (CUSTOM PROCEDURE TRAY) ×3 IMPLANT
KIT TURNOVER KIT B (KITS) ×3 IMPLANT
MARKER SKIN DUAL TIP RULER LAB (MISCELLANEOUS) ×3 IMPLANT
NS IRRIG 1000ML POUR BTL (IV SOLUTION) ×3 IMPLANT
PACK GENERAL/GYN (CUSTOM PROCEDURE TRAY) ×3 IMPLANT
PAD ARMBOARD 7.5X6 YLW CONV (MISCELLANEOUS) ×3 IMPLANT
PENCIL SMOKE EVACUATOR (MISCELLANEOUS) ×3 IMPLANT
PIN SAFETY STERILE (MISCELLANEOUS) ×3 IMPLANT
SPECIMEN JAR X LARGE (MISCELLANEOUS) ×3 IMPLANT
SPONGE LAP 18X18 RF (DISPOSABLE) ×3 IMPLANT
STAPLER VISISTAT 35W (STAPLE) ×3 IMPLANT
STRIP CLOSURE SKIN 1/2X4 (GAUZE/BANDAGES/DRESSINGS) ×3 IMPLANT
SUT ETHILON 2 0 FS 18 (SUTURE) ×6 IMPLANT
SUT MON AB 4-0 PC3 18 (SUTURE) ×3 IMPLANT
SUT VIC AB 2-0 SH 18 (SUTURE) ×4 IMPLANT
SUT VIC AB 3-0 54X BRD REEL (SUTURE) ×1 IMPLANT
SUT VIC AB 3-0 BRD 54 (SUTURE) ×3
SUT VIC AB 3-0 SH 18 (SUTURE) ×3 IMPLANT
SUT VIC AB 3-0 SH 8-18 (SUTURE) ×3 IMPLANT
TOWEL GREEN STERILE (TOWEL DISPOSABLE) ×3 IMPLANT
TOWEL GREEN STERILE FF (TOWEL DISPOSABLE) ×3 IMPLANT
TUBE CONNECTING 12'X1/4 (SUCTIONS) ×1
TUBE CONNECTING 12X1/4 (SUCTIONS) ×2 IMPLANT
YANKAUER SUCT BULB TIP NO VENT (SUCTIONS) ×2 IMPLANT

## 2020-01-24 NOTE — Anesthesia Preprocedure Evaluation (Addendum)
Anesthesia Evaluation  Patient identified by MRN, date of birth, ID band Patient awake    Reviewed: Allergy & Precautions, NPO status , Patient's Chart, lab work & pertinent test results  Airway Mallampati: I  TM Distance: >3 FB Neck ROM: Full    Dental  (+) Dental Advisory Given, Upper Dentures, Poor Dentition   Pulmonary asthma , Current Smoker,    breath sounds clear to auscultation       Cardiovascular negative cardio ROS   Rhythm:Regular Rate:Normal     Neuro/Psych negative neurological ROS  negative psych ROS   GI/Hepatic negative GI ROS, Neg liver ROS,   Endo/Other  negative endocrine ROS  Renal/GU negative Renal ROS     Musculoskeletal  (+) Arthritis ,   Abdominal Normal abdominal exam  (+)   Peds  Hematology negative hematology ROS (+)   Anesthesia Other Findings   Reproductive/Obstetrics                            Anesthesia Physical Anesthesia Plan  ASA: II  Anesthesia Plan: General   Post-op Pain Management:  Regional for Post-op pain   Induction: Intravenous  PONV Risk Score and Plan: 3 and Ondansetron, Dexamethasone and Midazolam  Airway Management Planned: LMA  Additional Equipment: None  Intra-op Plan:   Post-operative Plan: Extubation in OR  Informed Consent: I have reviewed the patients History and Physical, chart, labs and discussed the procedure including the risks, benefits and alternatives for the proposed anesthesia with the patient or authorized representative who has indicated his/her understanding and acceptance.     Dental advisory given  Plan Discussed with: CRNA  Anesthesia Plan Comments: (Lab Results      Component                Value               Date                      WBC                      3.4 (L)             01/17/2020                HGB                      13.1                01/17/2020                HCT                       39.3                01/17/2020                MCV                      93.6                01/17/2020                PLT                      254  01/17/2020            Lab Results      Component                Value               Date                      INR                      1.0                 09/04/2019                INR                      0.96                09/03/2014                INR                      1.03                03/22/2014           )        Anesthesia Quick Evaluation

## 2020-01-24 NOTE — Progress Notes (Signed)
Pt arrived to 6N05, Pt awake and alert. Pt complain of 10/10 pain in surgical site. See assessment. See MAR. Sister at bedside. Will continue to monitor pt.

## 2020-01-24 NOTE — Anesthesia Procedure Notes (Signed)
Anesthesia Regional Block: Pectoralis block   Pre-Anesthetic Checklist: ,, timeout performed, Correct Patient, Correct Site, Correct Laterality, Correct Procedure, Correct Position, site marked, Risks and benefits discussed,  Surgical consent,  Pre-op evaluation,  At surgeon's request and post-op pain management  Laterality: Right  Prep: chloraprep       Needles:  Injection technique: Single-shot  Needle Type: Echogenic Stimulator Needle     Needle Length: 9cm  Needle Gauge: 21     Additional Needles:   Procedures:,,,, ultrasound used (permanent image in chart),,,,  Narrative:  Start time: 01/24/2020 8:55 AM End time: 01/24/2020 9:05 AM Injection made incrementally with aspirations every 5 mL.  Performed by: Personally  Anesthesiologist: Effie Berkshire, MD  Additional Notes: Patient tolerated the procedure well. Local anesthetic introduced in an incremental fashion under minimal resistance after negative aspirations. No paresthesias were elicited. After completion of the procedure, no acute issues were identified and patient continued to be monitored by RN.

## 2020-01-24 NOTE — Op Note (Signed)
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 she was seen in er and treated for abscess and sent for imaging. she underwent mm that shows right sided skin thickening with large mass. on [place] there is a 7.3x7x5.6 cm mass present with 12 enlarged nodes. she has pain in right breast. 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 she had an mri that showed right breast with 7.2 cm mass and suspicious skin thickening with at least over 7 pos nodes. the left breast also had a indeterminate focus that was not biopsied apparently. she has undergone chemotherapy and has tolerated well. she has f/u mri that shows decreased size of previous left breast focus and residual 3.5x4 cm ill defined persistent nme in right breast with normal appearing nodes. She has brca diagnosis as well. Wishes to proceed with right mrm only.    Procedure: After informed consent obtained patient taken to the OR.  She underwent pectoral block. SCDs were in place.  She was placed under anesthesia without complication.  She was prepped and draped in standard sterile surgical fashion. Surgical timeout was performed  I made elliptical incision encompassing the NAC.  I then created flaps to the inframammary fold, parasternal region, clavicle and the latissimus.  There were fair amount of vessels in this dissection plane that I clipped and ligated.  I then removed the breast and the pectoralis  fascia from the muscle. I rolled this laterally and entered the axilla.  I then identified the axillary vein, thoracodorsal bundle and the long thoracic nerve.  There were not really any enlarged nodes but due to volume of nodal disease pre therapy I did do alnd.  I swept the nodes and surrounding tissue off the axillary vein and removed this tissue preserving the nerves. This was marked as above and passed off the table.  I then obtained hemostasis. I did place Ar[place].  I then placed 2 19 Fr Blake drains and secured these with 2-0 nylon suture.  I then quilted the lateral tissue to the chest wall and the pectoralis muscle with 2-0 vicryl. The skin was closed with 3-0 vicryl and 4-0 monocryl. Glue and steristrips were placed. Dressings placed. She tolerated well, was extubated and transferred to recovery stable.

## 2020-01-24 NOTE — Transfer of Care (Signed)
Immediate Anesthesia Transfer of Care Note  Patient: Gina Ross  Procedure(s) Performed: RIGHT MASTECTOMY WITH AXILLARY LYMPH NODE DISSECTION (Right Breast)  Patient Location: PACU  Anesthesia Type:General and Regional  Level of Consciousness: awake and alert   Airway & Oxygen Therapy: Patient Spontanous Breathing  Post-op Assessment: Report given to RN, Post -op Vital signs reviewed and stable and Patient moving all extremities X 4  Post vital signs: Reviewed and stable  Last Vitals:  Vitals Value Taken Time  BP 170/121 01/24/20 1059  Temp    Pulse 102 01/24/20 1102  Resp 21 01/24/20 1102  SpO2 100 % 01/24/20 1102  Vitals shown include unvalidated device data.  Last Pain:  Vitals:   01/24/20 0756  PainSc: 0-No pain      Patients Stated Pain Goal: 0 (94/80/16 [id number])  Complications: No complications documented.

## 2020-01-24 NOTE — Anesthesia Procedure Notes (Signed)
Procedure Name: LMA Insertion Date/Time: 01/24/2020 9:24 AM Performed by: Harden Mo, CRNA Pre-anesthesia Checklist: Patient identified, Emergency Drugs available, Suction available and Patient being monitored Patient Re-evaluated:Patient Re-evaluated prior to induction Oxygen Delivery Method: Circle System Utilized Preoxygenation: Pre-oxygenation with 100% oxygen Induction Type: IV induction LMA: LMA inserted LMA Size: 4.0 Number of attempts: 1 Airway Equipment and Method: Bite block Placement Confirmation: positive ETCO2 Tube secured with: Tape Dental Injury: Teeth and Oropharynx as per pre-operative assessment

## 2020-01-24 NOTE — Anesthesia Postprocedure Evaluation (Signed)
Anesthesia Post Note  Patient: Gina Ross  Procedure(s) Performed: RIGHT MASTECTOMY WITH AXILLARY LYMPH NODE DISSECTION (Right Breast)     Patient location during evaluation: PACU Anesthesia Type: General Level of consciousness: awake and alert Pain management: pain level controlled Vital Signs Assessment: post-procedure vital signs reviewed and stable Respiratory status: spontaneous breathing, nonlabored ventilation, respiratory function stable and patient connected to nasal cannula oxygen Cardiovascular status: blood pressure returned to baseline and stable Postop Assessment: no apparent nausea or vomiting Anesthetic complications: no   No complications documented.  Last Vitals:  Vitals:   01/24/20 1245 01/24/20 1322  BP: (!) 140/92 (!) 195/111  Pulse: 100 86  Resp: 18 18  Temp: 36.4 C   SpO2: 99% 100%               Effie Berkshire

## 2020-01-24 NOTE — H&P (Signed)
Gina Ross is an 49 y.o. female.   Chief Complaint: breast cancer, brca pos  HPI:  41 yof with family history in her mom at young age who presents with several weeks of large right breast mass. she was seen in er and treated for abscess and sent for imaging. she underwent mm that shows right sided skin thickening with large mass. on [place] there is a 7.3x7x5.6 cm mass present with 12 enlarged nodes. she has pain in right breast. 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 she had an mri that showed right breast with 7.2 cm mass and suspicious skin thickening with at least over 7 pos nodes. the left breast also had a indeterminate focus that was not biopsied apparently. she has undergone chemotherapy and has tolerated well. she has f/u mri that shows decreased size of previous left breast focus and residual 3.5x4 cm ill defined persistent nme in right breast with normal appearing nodes.   Past Medical History:  Diagnosis Date  . Asthma    as a child  . Cancer ([place]) 07/2019   breast  . Family history of breast cancer   . Family history of colon cancer   . Family history of prostate cancer     Past Surgical History:  Procedure Laterality Date  . COLONOSCOPY    . HARDWARE REMOVAL Right 09/11/2014   Procedure: Removal Deep Hardware Right Knee;  Surgeon: Newt Minion, MD;  Location: [facility];  Service: Orthopedics;  Laterality: Right;  . IR IMAGING GUIDED PORT INSERTION  09/04/2019  . ORIF PATELLA Right 03/22/2014   Procedure: Open Reduction Internal Fixation Right Patella ;  Surgeon: Newt Minion, MD;  Location: [facility];  Service: Orthopedics;  Laterality: Right;    Family History  Problem Relation Age of Onset  . Hypertension Mother   . Diabetes Mother   . Breast cancer Mother        dx. 59s  . Hypertension Father   . Colon cancer Paternal Aunt        dx. 90s  . Breast cancer Maternal Grandmother        bilateral   . Cervical cancer Maternal Grandmother        tumor on cervix  . Prostate cancer Maternal Uncle        dx. 11s  . Breast cancer Cousin        dx. late 32s  . Breast cancer Cousin        dx. <50   Social History:  reports that she has been smoking. She has been smoking about 0.10 packs per day. She has never used smokeless tobacco. She reports current alcohol use. She reports current drug use. Drug: Marijuana.  Allergies:  Allergies  Allergen Reactions  . Penicillins     Unknown reaction    Medications Prior to Admission  Medication Sig Dispense Refill  . acetaminophen (TYLENOL) 325 MG tablet Take 650 mg by mouth every 6 (six) hours as needed for moderate pain or headache.     . cetirizine (ZYRTEC) 10 MG tablet Take 10 mg by mouth daily as needed for allergies.    Marland Kitchen ibuprofen (ADVIL) 800 MG tablet Take 800 mg by mouth every 8 (eight) hours as needed for moderate pain.    Marland Kitchen lidocaine-prilocaine (EMLA) cream Apply to affected area once (Patient taking differently: Apply 1 application topically daily as  needed (port access). ) 30 g 3  . gabapentin (NEURONTIN) 100 MG capsule Take 100-300 mg by mouth See admin instructions. Take 100 mg at bedtime, may increase up to 300 mg at bedtime as needed for pain    . LORazepam (ATIVAN) 0.5 MG tablet Take 1 tablet (0.5 mg total) by mouth at bedtime as needed (Nausea or vomiting). (Patient not taking: Reported on 12/18/2019) 30 tablet 1  . ondansetron (ZOFRAN) 8 MG tablet Take 1 tablet (8 mg total) by mouth 2 (two) times daily as needed. Start on the third day after chemotherapy. (Patient not taking: Reported on 10/16/2019) 30 tablet 1  . prochlorperazine (COMPAZINE) 10 MG tablet Take 1 tablet (10 mg total) by mouth every 6 (six) hours as needed (Nausea or vomiting). (Patient not taking: Reported on 10/16/2019) 30 tablet 1  . traMADol (ULTRAM) 50 MG tablet Take 1 tablet (50 mg total) by mouth every 6 (six) hours as needed. (Patient not taking: Reported on  12/18/2019) 12 tablet 0    No results found for this or any previous visit (from the past 48 hour(s)). No results found.  Review of Systems Negative  There were no vitals taken for this visit. Physical Exam  Vitals (Chanel Nolan CMA; 01/21/2020 2:10 PM) 01/21/2020 2:09 PM Weight: 156 lb Height: 69in Body Surface Area: 1.86 m Body Mass Index: 23.04 kg/m  Temp.: 97.66F  Pulse: 108 (Regular)  BP: 136/86(Sitting, Left Arm, Standard) Physical Exam  General Mental Status-Alert. Orientation-Oriented X3. Breast Nipples-No Discharge. Note: no left breast mass right breast with central thickening and flatter than left side Lymphatic Head & Neck General Head & Neck Lymphatics: Bilateral - Description - Normal. Axillary General Axillary Region: Bilateral - Description - Normal. 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 elected to not proceed with recon at this time due to need for radiation. also discussed alnd due to number of nodes involved. discussed risks including bleeding, infection, wound issues, drains and lymphedema will proceed thursday She does not desire to have bilateral mastectomies now and we will plan to readdress the left sided lesion at later date after discussing with oncology  [name], MD 01/24/2020, 7:18 AM

## 2020-01-24 NOTE — Interval H&P Note (Signed)
History and Physical Interval Note:  01/24/2020 8:32 AM  Gina Ross  has presented today for surgery, with the diagnosis of RIGHT BREAST CANCER.  The various methods of treatment have been discussed with the patient and family. After consideration of risks, benefits and other options for treatment, the patient has consented to  Procedure(s) with comments: [place] (Right) - PEC BLOCK as a surgical intervention.  The patient's history has been reviewed, patient examined, no change in status, stable for surgery.  I have reviewed the patient's chart and labs.  Questions were answered to the patient's satisfaction.     [name]

## 2020-01-25 ENCOUNTER — Encounter (HOSPITAL_COMMUNITY): Payer: Self-pay | Admitting: General Surgery

## 2020-01-25 LAB — SURGICAL PATHOLOGY

## 2020-01-25 MED ORDER — OXYCODONE HCL 5 MG PO TABS: 5.0000 mg | ORAL_TABLET | ORAL | 0 refills | Status: DC | PRN

## 2020-01-25 MED ORDER — GABAPENTIN 100 MG PO CAPS: 100.0000 mg | ORAL_CAPSULE | Freq: Every day | ORAL | 1 refills | Status: DC

## 2020-01-25 MED ORDER — METHOCARBAMOL 500 MG PO TABS: 500.0000 mg | ORAL_TABLET | Freq: Three times a day (TID) | ORAL | 1 refills | Status: DC

## 2020-01-25 NOTE — Progress Notes (Signed)
Patient discharged to home with instructions and supplies. 

## 2020-01-25 NOTE — Discharge Instructions (Signed)
[facility], [place] [phone]  MASTECTOMY: POST OP INSTRUCTIONS Take 400 mg of ibuprofen every 8 hours or 650 mg tylenol every 6 hours for next 72 hours then as needed. Use ice several times daily also. Always review your discharge instruction sheet given to you by the facility where your surgery was performed. IF YOU HAVE DISABILITY OR FAMILY LEAVE FORMS, YOU MUST BRING THEM TO THE OFFICE FOR PROCESSING.   DO NOT GIVE THEM TO YOUR DOCTOR. A prescription for pain medication may be given to you upon discharge.  Take your pain medication as prescribed, if needed.  If narcotic pain medicine is not needed, then you may take acetaminophen (Tylenol), naprosyn (Alleve) or ibuprofen (Advil) as needed. 1. Take your usually prescribed medications unless otherwise directed. 2. If you need a refill on your pain medication, please contact your pharmacy.  They will contact our office to request authorization.  Prescriptions will not be filled after 5pm or on week-ends. 3. You should follow a light diet the first few days after arrival home, such as soup and crackers, etc.  Resume your normal diet the day after surgery. 4. Most patients will experience some swelling and bruising on the chest and underarm.  Ice packs will help.  Swelling and bruising can take several days to resolve. Wear the binder day and night until you return to the office.  5. It is common to experience some constipation if taking pain medication after surgery.  Increasing fluid intake and taking a stool softener (such as Colace) will usually help or prevent this problem from occurring.  A mild laxative (Milk of Magnesia or Miralax) should be taken according to package instructions if there are no bowel movements after 48 hours. 6. You may shower 48 hours after surgery You may have steri-strips (small skin tapes) in place directly over the incision.  These strips should be left on the skin for 7-10 days. If you have glue it will  come off in next couple week.  Any sutures will be removed at an office visit 7. DRAINS:  If you have drains in place, it is important to keep a list of the amount of drainage produced each day in your drains.  Before leaving the hospital, you should be instructed on drain care.  Call our office if you have any questions about your drains. I will remove your drains when they put out less than 30 cc or ml for 2 consecutive days. 8. ACTIVITIES:  You may resume regular (light) daily activities beginning the next day--such as daily self-care, walking, climbing stairs--gradually increasing activities as tolerated.  You may have sexual intercourse when it is comfortable.  Refrain from any heavy lifting or straining until approved by your doctor. a. You may drive when you are no longer taking prescription pain medication, you can comfortably wear a seatbelt, and you can safely maneuver your car and apply brakes. b. RETURN TO WORK:  __________________________________________________________ 9. You should see your doctor in the office for a follow-up appointment approximately 3-5 days after your surgery.  Your doctor's nurse will typically make your follow-up appointment when she calls you with your pathology report.  Expect your pathology report 3-4business days after surgery. 10. OTHER INSTRUCTIONS: ______________________________________________________________________________________________ ____________________________________________________________________________________________ WHEN TO CALL YOUR DR Kenyon Eshleman: 1. Fever over 101.0 2. Nausea and/or vomiting 3. Extreme swelling or bruising 4. Continued bleeding from incision. 5. Increased pain, redness, or drainage from the incision. The clinic staff is available to answer your questions during  regular business hours.  Please don't hesitate to call and ask to speak to one of the nurses for clinical concerns.  If you have a medical emergency, go to the  nearest emergency room or call 911.  A surgeon from [facility] is always on call at the hospital. [address], [place], [place], [place]  [address] ? [address], [place], [place]   [address] [phone] ? [phone] ? FAX [phone] Web site: [email or web address]

## 2020-01-25 NOTE — Discharge Summary (Signed)
Physician Discharge Summary  Patient ID: Gina Ross MRN: [id number] DOB/AGE: 07/24/1970 49 y.o.  Admit date: 01/24/2020 Discharge date: 01/25/2020  Admission Diagnoses: Right breast cancer BRCA mutation  Discharge Diagnoses:  Active Problems:   Breast cancer, right [facility])   Discharged Condition: good  Hospital Course: 49 yof underwent right mrm. She is doing well. Ready for dc  Consults: None  Significant Diagnostic Studies: none  Treatments: surgery: right mrm  Discharge Exam: Blood pressure (!) 161/94, pulse 92, temperature 98.4 F (36.9 C), resp. rate 16, height 5' 9"  (1.753 m), weight 69.7 kg, SpO2 99 %. Incision/Wound:no hematoma, drains serosang as expected  Disposition: Discharge disposition: 01-Home or Self Care        Allergies as of 01/25/2020      Reactions   Penicillins    Unknown reaction      Medication List    TAKE these medications   acetaminophen 325 MG tablet Commonly known as: TYLENOL Take 650 mg by mouth every 6 (six) hours as needed for moderate pain or headache.   cetirizine 10 MG tablet Commonly known as: ZYRTEC Take 10 mg by mouth daily as needed for allergies.   gabapentin 100 MG capsule Commonly known as: NEURONTIN Take 100-300 mg by mouth See admin instructions. Take 100 mg at bedtime, may increase up to 300 mg at bedtime as needed for pain What changed: Another medication with the same name was added. Make sure you understand how and when to take each.   gabapentin 100 MG capsule Commonly known as: NEURONTIN Take 1 capsule (100 mg total) by mouth at bedtime. What changed: You were already taking a medication with the same name, and this prescription was added. Make sure you understand how and when to take each.   ibuprofen 800 MG tablet Commonly known as: ADVIL Take 800 mg by mouth every 8 (eight) hours as needed for moderate pain.   lidocaine-prilocaine cream Commonly known as: EMLA Apply to affected area  once What changed:   how much to take  how to take this  when to take this  reasons to take this  additional instructions   LORazepam 0.5 MG tablet Commonly known as: Ativan Take 1 tablet (0.5 mg total) by mouth at bedtime as needed (Nausea or vomiting).   methocarbamol 500 MG tablet Commonly known as: ROBAXIN Take 1 tablet (500 mg total) by mouth 3 (three) times daily.   ondansetron 8 MG tablet Commonly known as: Zofran Take 1 tablet (8 mg total) by mouth 2 (two) times daily as needed. Start on the third day after chemotherapy.   oxyCODONE 5 MG immediate release tablet Commonly known as: Oxy IR/ROXICODONE Take 1 tablet (5 mg total) by mouth every 4 (four) hours as needed for moderate pain.   prochlorperazine 10 MG tablet Commonly known as: COMPAZINE Take 1 tablet (10 mg total) by mouth every 6 (six) hours as needed (Nausea or vomiting).   traMADol 50 MG tablet Commonly known as: ULTRAM Take 1 tablet (50 mg total) by mouth every 6 (six) hours as needed.       Follow-up Information    [name], MD In 1 week.   Specialty: General Surgery Contact information: [address] [place] [address] [phone]               Signed: [name] 01/25/2020, 8:26 AM

## 2020-01-29 ENCOUNTER — Encounter: Payer: Self-pay | Admitting: *Deleted

## 2020-01-29 DIAGNOSIS — C50811 Malignant neoplasm of overlapping sites of right female breast: Secondary | ICD-10-CM

## 2020-01-30 NOTE — Progress Notes (Signed)
Patient Care Team: Patient, [name] as PCP - General (General Practice) Gina Kaufmann, RN as Oncology Nurse Navigator Gina Germany, RN as Oncology Nurse Navigator Gina Bookbinder, MD as Consulting Physician (General Surgery) Gina Lose, MD as Consulting Physician (Hematology and Oncology) Gina Pray, MD as Consulting Physician (Radiation Oncology)  DIAGNOSIS:    ICD-10-CM   1. Malignant neoplasm of overlapping sites of right breast in female, estrogen receptor positive (Gina Ross)  C50.811    Z17.0     SUMMARY OF ONCOLOGIC HISTORY: Oncology History  Malignant neoplasm of overlapping sites of right breast in female, estrogen receptor positive ([name])  08/17/2019 Initial Diagnosis   Patient palpated a right breast mass and right axilla mass and noted skin redness x2 weeks. She went to the ED and had an abscess drained with no improvement. Diagnostic mammogram and US showed a 7.3cm right breast mass extending into all 4 quadrants and partially into overlying skin with 12 abnormal right axillary lymph nodes. Biopsy  showed IDC with necrosis in the breast and axilla, grade 3, HER-2 equivocal by IHC, negative by FISH, ER+ 40% weak, PR -, Ki67 90%.    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 The report date is 09/03/2019.  The Common Hereditary Cancers Panel offered by [facility] includes sequencing and/or deletion duplication testing of the following 48 genes: APC, ATM, AXIN2, BARD1, BMPR1A, BRCA1, BRCA2, BRIP1, CDH1, CDK4, CDKN2A (p14ARF), CDKN2A (p16INK4a), CHEK2, CTNNA1, DICER1, EPCAM (Deletion/duplication testing only), GREM1 (promoter region deletion/duplication testing only), KIT, MEN1, MLH1, MSH2, MSH3, MSH6, MUTYH, NBN, NF1, NHTL1, PALB2, PDGFRA, PMS2, POLD1, POLE, PTEN, RAD50, RAD51C, RAD51D, RNF43, SDHB, SDHC, SDHD, SMAD4, SMARCA4. STK11, TP53,  TSC1, TSC2, and VHL.  The following genes were evaluated for sequence changes only: SDHA and HOXB13 c.251G>A variant only.    09/04/2019 -  Chemotherapy   The patient had dexamethasone (DECADRON) 4 MG tablet, 4 mg (100 % of original dose 4 mg), Oral, Daily, 1 of 1 cycle, Start date: 09/11/2019, End date: 09/13/2019 Dose modification: 4 mg (original dose 4 mg, Cycle 0) DOXOrubicin (ADRIAMYCIN) chemo injection 110 mg, 60 mg/m2 = 110 mg, Intravenous,  Once, 4 of 4 cycles Dose modification: 50 mg/m2 (original dose 60 mg/m2, Cycle 2, Reason: Provider Judgment) Administration: 110 mg (09/04/2019), 92 mg (09/18/2019), 92 mg (10/02/2019), 92 mg (10/16/2019) palonosetron (ALOXI) injection 0.25 mg, 0.25 mg, Intravenous,  Once, 4 of 4 cycles Administration: 0.25 mg (09/04/2019), 0.25 mg (09/18/2019), 0.25 mg (10/02/2019), 0.25 mg (10/16/2019) pegfilgrastim-cbqv (UDENYCA) injection 6 mg, 6 mg, Subcutaneous, Once, 4 of 4 cycles Administration: 6 mg (09/06/2019), 6 mg (09/20/2019), 6 mg (10/04/2019), 6 mg (10/18/2019) cyclophosphamide (CYTOXAN) 1,100 mg in sodium chloride 0.9 % 250 mL chemo infusion, 600 mg/m2 = 1,100 mg, Intravenous,  Once, 4 of 4 cycles Dose modification: 500 mg/m2 (original dose 600 mg/m2, Cycle 2, Reason: Provider Judgment) Administration: 1,100 mg (09/04/2019), 920 mg (09/18/2019), 920 mg (10/02/2019), 920 mg (10/16/2019) PACLitaxel (TAXOL) 144 mg in sodium chloride 0.9 % 250 mL chemo infusion (</= 95m/m2), 80 mg/m2 = 144 mg, Intravenous,  Once, 8 of 12 cycles Dose modification: 65 mg/m2 (original dose 80 mg/m2, Cycle 6, Reason: Dose not tolerated), 50 mg/m2 (original dose 80 mg/m2, Cycle 7, Reason: Provider Judgment) Administration: 144 mg (10/30/2019), 120 mg (11/06/2019), 90 mg (11/13/2019), 90 mg (11/20/2019), 90 mg (11/27/2019), 90 mg (12/04/2019), 90 mg (12/11/2019), 90 mg (  12/18/2019) fosaprepitant (EMEND) 150 mg in sodium chloride 0.9 % 145 mL IVPB, 150 mg, Intravenous,  Once, 4 of 4 cycles Administration: 150 mg  (09/04/2019), 150 mg (09/18/2019), 150 mg (10/02/2019), 150 mg (10/16/2019)  for chemotherapy treatment.    01/24/2020 Surgery   Right mastectomy Gina Ross): no residual carcinoma, 5 lymph nodes negative for carcinoma.     CHIEF COMPLIANT: Follow-up s/p right mastectomy   INTERVAL HISTORY: Gina Ross is a 49 y.o. with above-mentioned history of right breast cancer who completed 8 cycles of neoadjuvant chemotherapy withweekly Taxol and four cycles ofdose dense Adriamycin and Cytoxan.She underwent a right mastectomy on 7/29/21with Dr. Donne Ross for which pathology showed no residual carcinoma, 5 lymph nodes negative for carcinoma. She presents to the clinic today to review the pathology report and discuss further treatment.  She is complaining of a lot of pain and discomfort from the recent mastectomy and the axillary lymph node surgery.  Her discomfort is also related to the drains in place.  ALLERGIES:  is allergic to penicillins.  MEDICATIONS:  Current Outpatient Medications  Medication Sig Dispense Refill  . acetaminophen (TYLENOL) 325 MG tablet Take 650 mg by mouth every 6 (six) hours as needed for moderate pain or headache.     . cetirizine (ZYRTEC) 10 MG tablet Take 10 mg by mouth daily as needed for allergies.    Marland Kitchen gabapentin (NEURONTIN) 100 MG capsule Take 100-300 mg by mouth See admin instructions. Take 100 mg at bedtime, may increase up to 300 mg at bedtime as needed for pain    . gabapentin (NEURONTIN) 100 MG capsule Take 1 capsule (100 mg total) by mouth at bedtime. 30 capsule 1  . ibuprofen (ADVIL) 800 MG tablet Take 800 mg by mouth every 8 (eight) hours as needed for moderate pain.    Marland Kitchen lidocaine-prilocaine (EMLA) cream Apply to affected area once (Patient taking differently: Apply 1 application topically daily as needed (port access). ) 30 g 3  . LORazepam (ATIVAN) 0.5 MG tablet Take 1 tablet (0.5 mg total) by mouth at bedtime as needed (Nausea or vomiting). (Patient not  taking: Reported on 12/18/2019) 30 tablet 1  . methocarbamol (ROBAXIN) 500 MG tablet Take 1 tablet (500 mg total) by mouth 3 (three) times daily. 30 tablet 1  . ondansetron (ZOFRAN) 8 MG tablet Take 1 tablet (8 mg total) by mouth 2 (two) times daily as needed. Start on the third day after chemotherapy. (Patient not taking: Reported on 10/16/2019) 30 tablet 1  . oxyCODONE (OXY IR/ROXICODONE) 5 MG immediate release tablet Take 1 tablet (5 mg total) by mouth every 4 (four) hours as needed for moderate pain. 12 tablet 0  . prochlorperazine (COMPAZINE) 10 MG tablet Take 1 tablet (10 mg total) by mouth every 6 (six) hours as needed (Nausea or vomiting). (Patient not taking: Reported on 10/16/2019) 30 tablet 1  . traMADol (ULTRAM) 50 MG tablet Take 1 tablet (50 mg total) by mouth every 6 (six) hours as needed. (Patient not taking: Reported on 12/18/2019) 12 tablet 0   No current facility-administered medications for this visit.    PHYSICAL EXAMINATION: ECOG PERFORMANCE STATUS: 1 - Symptomatic but completely ambulatory  There were no vitals filed for this visit. There were no vitals filed for this visit.  LABORATORY DATA:  I have reviewed the data as listed CMP Latest Ref Rng & Units 01/17/2020 12/18/2019 12/11/2019  Glucose 70 - 99 mg/dL 94 90 101(H)  BUN 6 - 20 mg/dL 11 13 10  Creatinine 0.44 - 1.00 mg/dL 0.98 0.88 0.82  Sodium 135 - 145 mmol/L 141 140 140  Potassium 3.5 - 5.1 mmol/L 3.8 3.8 4.2  Chloride 98 - 111 mmol/L 107 107 108  CO2 22 - 32 mmol/L 24 24 24   Calcium 8.9 - 10.3 mg/dL 9.3 9.2 9.1  Total Protein 6.5 - 8.1 g/dL 7.0 7.2 6.6  Total Bilirubin 0.3 - 1.2 mg/dL 0.6 0.3 0.3  Alkaline Phos 38 - 126 U/L 58 65 59  AST 15 - 41 U/L 37 31 45(H)  ALT 0 - 44 U/L 52(H) 33 50(H)    Lab Results  Component Value Date   WBC 3.4 (L) 01/17/2020   HGB 13.1 01/17/2020   HCT 39.3 01/17/2020   MCV 93.6 01/17/2020   PLT 254 01/17/2020   NEUTROABS 1.0 (L) 12/18/2019    ASSESSMENT & PLAN:    Malignant neoplasm of overlapping sites of right breast in female, estrogen receptor positive ([place]) 08/17/19:Patient palpated a right breast mass and right axilla mass and noted skin redness x2 weeks. She went to the ED and had an abscess drained with no improvement. Diagnostic mammogram and US showed a 7.3cm right breast mass extending into all 4 quadrants and partially into overlying skin with 12 abnormal right axillary lymph nodes. 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  Treatment plan: 1. Neoadjuvant chemotherapy with Adriamycin and Cytoxan dose dense 4 followed byTaxolweekly 8 stopped after cycle 8 for neuropathy 2. Followed by breastmastectomy and axillary lymph node dissection 3. Followed by adjuvant radiation therapy 4.Follow-up adjuvant antiestrogen therapy  CT CAP 08/31/2019: Very large central right breast mass, bulky right axillary and subpectoral lymph nodes. No evidence of distant metastatic disease. Bulky uterine fibroids Bone scan 08/31/2019: No metastatic disease in the bones ---------------------------------------------------------------------------------------------------------------------------------------------------- Breast MRI: 01/07/2020: Treatment response with residual 3.5 x 4 cm ill-defined persistent non-mass-like enhancement at the site of the biopsy-proven malignancy.  Previous mass was 7.2 cm.  Normal right axillary lymph nodes.  Decreased size of the left breast focus.  Worsening peripheral neuropathy: Started on gabapentin.  I discussed with her that time will improve her symptoms slowly.  Gabapentin is meant to decrease the discomfort associated with the neuropathy.  01/24/2020: Right mastectomy: No residual cancer identified, 0/5 lymph nodes negative treatment effect in the breast and the lymph nodes.  ER 40% weak staining, PR negative, HER-2 negative, Ki-67  90%, complete pathologic response  Pathology counseling: I discussed the final pathology report of the patient provided  a copy of this report. I discussed the margins as well as lymph node surgeries. We also discussed the final staging along with previously performed ER/PR and HER-2/neu testing.  Treatment plan: Adjuvant radiation followed by adjuvant antiestrogen therapy. Return to clinic after radiation to start antiestrogens.    No orders of the defined types were placed in this encounter.  The patient has a good understanding of the overall plan. she agrees with it. she will call with any problems that may develop before the next visit here.  Total time spent: 30 mins including face to face time and time spent for planning, charting and coordination of care  Gina Lose, MD 01/31/2020  I, Cloyde Reams Dorshimer, am acting as scribe for Dr. Nicholas Ross.  I have reviewed the above documentation for accuracy and completeness, and I agree with the above.

## 2020-01-31 ENCOUNTER — Inpatient Hospital Stay: Payer: Medicaid Other | Attending: Hematology and Oncology | Admitting: Hematology and Oncology

## 2020-01-31 ENCOUNTER — Other Ambulatory Visit: Payer: Self-pay

## 2020-01-31 DIAGNOSIS — Z170 Estrogen receptor positive status [ER+]: Secondary | ICD-10-CM | POA: Diagnosis not present

## 2020-01-31 DIAGNOSIS — C50811 Malignant neoplasm of overlapping sites of right female breast: Secondary | ICD-10-CM

## 2020-01-31 DIAGNOSIS — Z9011 Acquired absence of right breast and nipple: Secondary | ICD-10-CM | POA: Diagnosis not present

## 2020-01-31 DIAGNOSIS — G620 Drug-induced polyneuropathy: Secondary | ICD-10-CM | POA: Insufficient documentation

## 2020-01-31 NOTE — Assessment & Plan Note (Addendum)
08/17/19:Patient palpated a right breast mass and right axilla mass and noted skin redness x2 weeks. She went to the ED and had an abscess drained with no improvement. Diagnostic mammogram and US showed a 7.3cm right breast mass extending into all 4 quadrants and partially into overlying skin with 12 abnormal right axillary lymph nodes. 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  Treatment plan: 1. Neoadjuvant chemotherapy with Adriamycin and Cytoxan dose dense 4 followed byTaxolweekly 8 stopped after cycle 8 for neuropathy 2. Followed by breastmastectomy and axillary lymph node dissection 3. Followed by adjuvant radiation therapy 4.Follow-up adjuvant antiestrogen therapy  CT CAP 08/31/2019: Very large central right breast mass, bulky right axillary and subpectoral lymph nodes. No evidence of distant metastatic disease. Bulky uterine fibroids Bone scan 08/31/2019: No metastatic disease in the bones ---------------------------------------------------------------------------------------------------------------------------------------------------- Breast MRI: 01/07/2020: Treatment response with residual 3.5 x 4 cm ill-defined persistent non-mass-like enhancement at the site of the biopsy-proven malignancy.  Previous mass was 7.2 cm.  Normal right axillary lymph nodes.  Decreased size of the left breast focus.  Worsening peripheral neuropathy: Started on gabapentin.  I discussed with her that time will improve her symptoms slowly.  Gabapentin is meant to decrease the discomfort associated with the neuropathy.  01/24/2020: Right mastectomy: No residual cancer identified, 0/5 lymph nodes negative treatment effect in the breast and the lymph nodes.  ER 40% weak staining, PR negative, HER-2 negative, Ki-67 90%, complete pathologic response  Pathology counseling: I discussed the final pathology report of the  patient provided  a copy of this report. I discussed the margins as well as lymph node surgeries. We also discussed the final staging along with previously performed ER/PR and HER-2/neu testing.  Treatment plan: Adjuvant radiation followed by adjuvant antiestrogen therapy. Return to clinic after radiation to start antiestrogens.

## 2020-02-01 ENCOUNTER — Telehealth: Payer: Self-pay | Admitting: Hematology and Oncology

## 2020-02-01 ENCOUNTER — Encounter: Payer: Self-pay | Admitting: *Deleted

## 2020-02-01 DIAGNOSIS — R928 Other abnormal and inconclusive findings on diagnostic imaging of breast: Secondary | ICD-10-CM

## 2020-02-01 DIAGNOSIS — C50811 Malignant neoplasm of overlapping sites of right female breast: Secondary | ICD-10-CM

## 2020-02-01 NOTE — Telephone Encounter (Signed)
No 8/5 los. No changes made to pt's schedule.  °

## 2020-02-04 ENCOUNTER — Encounter: Payer: Self-pay | Admitting: Licensed Clinical Social Worker

## 2020-02-04 NOTE — Progress Notes (Signed)
[place] CSW Progress Note  Clinical Education officer, museum received TC from pt requesting letter stating her diagnosis and date of diagnosis to submit to [employer] to supplement emergency rental assistance application. Letter written and sent to [facility] with [employer] as requested by patient.    Edwinna Areola Xan Ingraham , LCSW

## 2020-02-18 ENCOUNTER — Ambulatory Visit: Payer: Medicaid Other | Admitting: Physical Therapy

## 2020-02-21 ENCOUNTER — Ambulatory Visit: Payer: Medicaid Other

## 2020-02-21 ENCOUNTER — Ambulatory Visit
Admission: RE | Admit: 2020-02-21 | Discharge: 2020-02-21 | Disposition: A | Discharge status: Home / Self Care | Payer: Medicaid Other | Source: Ambulatory Visit | Attending: Radiation Oncology | Admitting: Radiation Oncology

## 2020-02-21 ENCOUNTER — Ambulatory Visit: Payer: Medicaid Other | Admitting: Radiation Oncology

## 2020-02-21 ENCOUNTER — Ambulatory Visit: Payer: Medicaid Other | Admitting: Physical Therapy

## 2020-02-25 ENCOUNTER — Other Ambulatory Visit: Payer: Self-pay

## 2020-02-25 ENCOUNTER — Encounter: Payer: Self-pay | Admitting: Physical Therapy

## 2020-02-25 ENCOUNTER — Ambulatory Visit: Payer: Medicaid Other | Attending: General Surgery | Admitting: Physical Therapy

## 2020-02-25 DIAGNOSIS — M25511 Pain in right shoulder: Secondary | ICD-10-CM

## 2020-02-25 DIAGNOSIS — R293 Abnormal posture: Secondary | ICD-10-CM

## 2020-02-25 DIAGNOSIS — Z483 Aftercare following surgery for neoplasm: Secondary | ICD-10-CM | POA: Diagnosis present

## 2020-02-25 DIAGNOSIS — C50811 Malignant neoplasm of overlapping sites of right female breast: Secondary | ICD-10-CM | POA: Diagnosis not present

## 2020-02-25 DIAGNOSIS — Z170 Estrogen receptor positive status [ER+]: Secondary | ICD-10-CM

## 2020-02-25 DIAGNOSIS — M25611 Stiffness of right shoulder, not elsewhere classified: Secondary | ICD-10-CM

## 2020-02-25 NOTE — Patient Instructions (Addendum)
Axillary web syndrome (also called cording) can happen after having breast cancer surgery when lymph nodes in the armpit are removed. It presents as if you have a thin cord in your arm and can run from the armpit all the way down into the forearm. If you've had a sentinel node biopsy, the risk is 1-20% and if you've had an axillary lymph node dissection (more than 7 nodes removed), the risk is 36-72%. The ranges vary depending on the research study.  It most often happens 3-4 weeks post-op but can happen sooner or later. There are several possibilities for what cording actually is. Although no one knows for sure as of yet, it may be related to lymphatics, veins, or other tissue. Sometimes cording resolves on its own but other times it requires physical therapy with a therapist who specializes in lymphedema and/or cancer rehab. Treatment typically involves stretching, manual techniques, and exercise. Sometimes cords get "released" while stretching or during manual treatment and the patient may experience the sensation of a "pop." This may feel strange but it is not dangerous and is a sign that the cord has released; range of motion may be improved in the process.    Copyright  VHI. All rights reserved.  Cane Exercise: Abduction    YOU CAN DO THIS LAYING ON YOUR BACK. Hold cane with right hand over end, palm-up, with other hand palm-down. Move arm out from side and up by pushing with other arm. Hold __1-2__ seconds. Repeat __10__ times. Do __2-3__ sessions per day.  http://gt2.exer.us/82   Copyright  VHI. All rights reserved.  External Rotation    Stand with hands clasped behind head. Pull elbows back as far as possible. Hold __3__ seconds. Repeat __10__ times. Do __2-3    http://ss.exer.us/267   Copyright  VHI. All rights reserved.  Scapular Retraction (Standing)    With arms at sides, pinch shoulder blades together. Repeat __10__ times per set. Do __1__ sets per session. Do  _2-3___ sessions per day.  http://orth.exer.us/945   Copyright  VHI. All rights reserved.  Flexion (Assistive)    Clasp hands together and raise arms above head, keeping elbows as straight as possible. Can be done sitting or lying. Repeat __10__ times. Do __2-3__ sessions per day.  Copyright  VHI. All rights reserved.  Shoulder: Flexion (Supine)    With hands shoulder width apart, slowly lower dowel to floor behind head. Do not let elbows bend. Keep back flat. Hold _3___ seconds. Repeat _10___ times. Do _2-3__ sessions per day. CAUTION: Stretch slowly and gently.  Copyright  VHI. All rights reserved.

## 2020-02-25 NOTE — Therapy (Signed)
[place], [place], [address] Phone: [phone]   Fax:  [phone]  Physical Therapy Evaluation  Patient Details  Name: Gina Ross MRN: [id number] Date of Birth: Jan 02, 1971 Referring Provider (PT): Dr. [name]   Encounter Date: 02/25/2020   PT End of Session - 02/25/20 1120    Visit Number 2    Number of Visits 10    Date for PT Re-Evaluation 03/24/20    Authorization Type [employer] Medicaid - out of network; treat up to 27 visits and no auth required as there is no ability to do an [place]    PT Start Time 1007    PT Stop Time 1105    PT Time Calculation (min) 58 min    Activity Tolerance Patient tolerated treatment well    Behavior During Therapy [facility] for tasks assessed/performed           Past Medical History:  Diagnosis Date  . Asthma    as a child  . Cancer ([facility]) 07/2019   breast  . Family history of breast cancer   . Family history of colon cancer   . Family history of prostate cancer     Past Surgical History:  Procedure Laterality Date  . COLONOSCOPY    . HARDWARE REMOVAL Right 09/11/2014   Procedure: Removal Deep Hardware Right Knee;  Surgeon: Newt Minion, MD;  Location: [facility];  Service: Orthopedics;  Laterality: Right;  . IR IMAGING GUIDED PORT INSERTION  09/04/2019  . MASTECTOMY WITH AXILLARY LYMPH NODE DISSECTION Right 01/24/2020   Procedure: RIGHT MASTECTOMY WITH AXILLARY LYMPH NODE DISSECTION;  Surgeon: [name], MD;  Location: [facility];  Service: General;  Laterality: Right;  PEC BLOCK  . ORIF PATELLA Right 03/22/2014   Procedure: Open Reduction Internal Fixation Right Patella ;  Surgeon: Newt Minion, MD;  Location: [facility];  Service: Orthopedics;  Laterality: Right;    There were no vitals filed for this visit.    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 She underwent a right mastectomy on 01/24/2020 with 0/5 nodes positive for cancer. She reports she will begin radiation soon but needs to improve shoulder ROM to get positioned.    Pertinent History Patient was diganosed on 08/14/2019 with right invasive ductal carcinoma breast cancer. It is ER positive, PR negative, and HER2 negative with a Ki67 of 90%. 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 She underwent a right mastectomy on 01/24/2020 with 0/5 nodes positive for cancer. She reports she will begin radiation soon but needs to improve shoulder ROM to get positioned.    Patient Stated Goals Get my arm moving so I can do radiation.    Currently in Pain? Yes    Pain Score 9     Pain Location Axilla    Pain Orientation Right    Pain Descriptors / Indicators Tightness;Sharp    Pain Type Surgical pain    Pain Onset More than a month ago    Pain Frequency Intermittent    Aggravating Factors  Trying to move my arm    Pain Relieving Factors Resting              [facility] PT Assessment - 02/25/20 0001  Assessment   Medical Diagnosis s/p right mastectomy and ALND    Referring Provider (PT) Dr. [name]    Onset Date/Surgical Date 08/14/19    Hand Dominance Right    Prior Therapy Baselines      Precautions   Precautions Other (comment)    Precaution Comments Recent surgery; right arm lymphedema risk      Restrictions   Weight Bearing Restrictions No      Balance Screen   Has the patient fallen in the past 6 months No    Has the patient had a decrease in activity level because of a fear of falling?  No    Is the patient reluctant to leave their home because of a fear of falling?  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 There is a mild pocket of fluid present at the lateral aspect of her incision. Cording present in right axilla extending into her mid upper arm. See photo in media for cording.      Posture/Postural Control   Posture/Postural Control Postural limitations    Postural Limitations Rounded Shoulders;Forward head      ROM / Strength   AROM / PROM / Strength AROM      AROM   AROM Assessment Site Shoulder    Right/Left Shoulder Right    Right Shoulder Extension 42 Degrees    Right Shoulder Flexion 85 Degrees    Right Shoulder ABduction 78 Degrees      Strength   Overall Strength Unable to assess;Due to pain             LYMPHEDEMA/ONCOLOGY QUESTIONNAIRE - 02/25/20 0001      Type   Cancer Type Right breast      Surgeries   Mastectomy Date 01/24/20    Axillary Lymph Node Dissection Date 01/24/20    Number Lymph Nodes Removed 5      Treatment   Active Chemotherapy Treatment No    Past Chemotherapy Treatment Yes    Date 09/11/19    Active Radiation Treatment No    Past Radiation Treatment No    Current Hormone Treatment No    Past Hormone Therapy No      What other symptoms do you have   Are you Having Heaviness or Tightness Yes    Are you having Pain Yes    Are you having pitting edema No    Is it Hard or Difficult finding clothes that fit No    Do you have infections No    Is there Decreased scar mobility Yes    [place] Sign No      Lymphedema Assessments   Lymphedema  Assessments Upper extremities      Right Upper Extremity Lymphedema   10 cm Proximal to Olecranon Process 23.7 cm    Olecranon Process 21.9 cm    10 cm  Proximal to Ulnar Styloid Process 16.4 cm    Just Proximal to Ulnar Styloid Process 13.2 cm    Across Hand at [employer] 17.3 cm    At [place] of 2nd Digit 5.4 cm      Left Upper Extremity Lymphedema   10 cm Proximal to Olecranon Process 24.1 cm    Olecranon Process 22.6 cm    10 cm Proximal to Ulnar Styloid Process 15.9 cm    Just Proximal to Ulnar Styloid Process 13.2 cm    Across Hand at [employer] 17 cm    At [place] of 2nd Digit 5.6 cm                 Quick Dash - 02/25/20 0001    Open a tight or new jar No difficulty    Do heavy household chores (wash walls, wash floors) Mild difficulty    Carry a shopping bag or briefcase Moderate difficulty    Wash your back Unable    Use a knife to cut food Mild difficulty    Recreational activities in which you take some force or impact through your arm, shoulder, or hand (golf, hammering, tennis) Unable    During the past week, to what extent has your arm, shoulder or hand problem interfered with your normal social activities with family, friends, neighbors, or groups? Modererately    During the past week, to what extent has your arm, shoulder or hand problem limited your work or other regular daily activities Quite a bit    Arm, shoulder, or hand pain. Moderate    Tingling (pins and needles) in your arm, shoulder, or hand None    Difficulty Sleeping Moderate difficulty    DASH Score 47.73 %            Objective measurements completed on examination: See above findings.       [facility] Adult PT Treatment/Exercise - 02/25/20 0001      Exercises   Exercises Shoulder      Shoulder Exercises: Pulleys   Flexion 2 minutes    Flexion Limitations Limited by pain    ABduction 2 minutes    ABduction Limitations Limited by pain      Manual Therapy   Manual Therapy  Myofascial release;Passive ROM    Myofascial Release To right axilla in supine with shoulder in ~ 80 degrees abduction to try to release cording    Passive ROM To right shoulder in supine - flexion and abduction to pt tolerance (about 90 degrees)                  PT Education - 02/25/20 1119    Education Details Axillary cording; new HEP for ROM    Person(s) Educated Patient    Methods Explanation;Demonstration;Handout    Comprehension Verbalized understanding;Returned demonstration               PT Long Term Goals - 02/25/20 1131      PT LONG TERM GOAL #1   Title Patient will demonstrate she has regained full shoulder ROM and function post operatively compared to baselines.    Time 4    Period Weeks    Status On-going    Target Date 03/24/20      PT LONG TERM GOAL #2   Title Patient will increase right shoulder active flexion to >/= 120 degrees for increased ease reaching overhead.    Baseline 85 degrees post op; 134 degrees pre-op  Time 4    Period Weeks    Status New    Target Date 03/24/20      PT LONG TERM GOAL #3   Title Patient will increase right shoulder active abduction to >/= 120 degrees for ability to obtain radiation positioning.    Baseline 78 degrees post op; 165 degrees pre-op    Time 4    Period Weeks    Target Date 03/24/20      PT LONG TERM GOAL #4   Title Patient will improve her DASH to </= 14 for improved overall upper extremity function.    Baseline 47.73 post op; 13.64 pre-op    Time 4    Period Weeks    Status New    Target Date 03/24/20      PT LONG TERM GOAL #5   Title Patient will verbalize good understanding of lympehdema risk reduction practices.    Baseline Limited / No knowledge    Time 4    Period Weeks    Status New    Target Date 03/24/20                  Plan - 02/25/20 1124    Clinical Impression Statement Patient is having a difficult time s/p right mastectomy and ALND. She underwent neoadjuvant  chemotherapy and is still having CIPN symptoms but her primary concern is her axillary pain and limited shoulder ROM. She has significant cording in her axilla limiting her shoulder ROM. This is currently going to prohibit her positioning required for radiation. Her incision has healed well with no sign of infection. She has no sign of lymphedema but has very bmild edema in her right lateral trunk just lateral to her incision. She will benefit from PT to restore shoulder AROM, decrease pain, and resolve cording to allow her to be positoned for radiation.    PT Frequency 2x / week    PT Duration 4 weeks    PT Treatment/Interventions ADLs/Self Care Home Management;Therapeutic exercise;Patient/family education;Manual techniques;Manual lymph drainage;Passive range of motion;Therapeutic activities;Scar mobilization    PT Next Visit Plan PROM right shoulder; myofascial release to pt tolerance to release axillary cords; AAROM exercises    PT Home Exercise Plan Post op shoulder ROM HEP    Consulted and Agree with Plan of Care Patient           Patient will benefit from skilled therapeutic intervention in order to improve the following deficits and impairments:  Postural dysfunction, Decreased range of motion, Impaired UE functional use, Pain, Decreased knowledge of precautions, Increased edema, Decreased scar mobility, Increased fascial restricitons  Visit Diagnosis: Malignant neoplasm of overlapping sites of right breast in female, estrogen receptor positive ([facility]) - Plan: PT plan of care cert/re-cert  Abnormal posture - Plan: PT plan of care cert/re-cert  Aftercare following surgery for neoplasm - Plan: PT plan of care cert/re-cert  Acute pain of right shoulder - Plan: PT plan of care cert/re-cert  Stiffness of right shoulder, not elsewhere classified - Plan: PT plan of care cert/re-cert     Problem List Patient Active Problem List   Diagnosis Date Noted  . Breast cancer, right ([facility])  01/24/2020  . Port-A-Cath in place 09/18/2019  . BRCA1 gene mutation positive in female 09/05/2019  . Genetic testing 09/05/2019  . Family history of breast cancer   . Family history of prostate cancer   . Family history of colon cancer   . Malignant neoplasm of overlapping sites of right breast  in female, estrogen receptor positive ([facility]) 08/17/2019  . Unilateral primary osteoarthritis, right knee 01/31/2017   Annia Friendly, PT 02/25/20 11:39 AM  [place], [place], [address] Phone: [phone]   Fax:  [phone]  Name: Gina Ross MRN: [id number] Date of Birth: [birth year]

## 2020-02-26 ENCOUNTER — Telehealth: Payer: Self-pay | Admitting: Hematology and Oncology

## 2020-02-26 ENCOUNTER — Telehealth: Payer: Self-pay | Admitting: *Deleted

## 2020-02-26 ENCOUNTER — Encounter: Payer: Self-pay | Admitting: *Deleted

## 2020-02-26 NOTE — Telephone Encounter (Signed)
Scheduled appt per 8/31 schmsg - pt is aware of apts

## 2020-02-26 NOTE — Telephone Encounter (Signed)
Pt called and would like to receive covid. Scheduling msg sent. Informed pt she will receive a call with appt. Denies further needs at this time.

## 2020-02-27 ENCOUNTER — Inpatient Hospital Stay: Payer: Medicaid Other | Attending: Hematology and Oncology

## 2020-02-27 ENCOUNTER — Ambulatory Visit: Payer: Medicaid Other

## 2020-02-27 ENCOUNTER — Other Ambulatory Visit: Payer: Self-pay

## 2020-02-27 ENCOUNTER — Ambulatory Visit: Payer: Medicaid Other | Attending: General Surgery | Admitting: Physical Therapy

## 2020-02-27 ENCOUNTER — Encounter: Payer: Self-pay | Admitting: Physical Therapy

## 2020-02-27 DIAGNOSIS — C50811 Malignant neoplasm of overlapping sites of right female breast: Secondary | ICD-10-CM | POA: Insufficient documentation

## 2020-02-27 DIAGNOSIS — Z483 Aftercare following surgery for neoplasm: Secondary | ICD-10-CM

## 2020-02-27 DIAGNOSIS — Z170 Estrogen receptor positive status [ER+]: Secondary | ICD-10-CM | POA: Diagnosis present

## 2020-02-27 DIAGNOSIS — M25511 Pain in right shoulder: Secondary | ICD-10-CM | POA: Insufficient documentation

## 2020-02-27 DIAGNOSIS — M25611 Stiffness of right shoulder, not elsewhere classified: Secondary | ICD-10-CM | POA: Insufficient documentation

## 2020-02-27 DIAGNOSIS — R293 Abnormal posture: Secondary | ICD-10-CM | POA: Diagnosis present

## 2020-02-27 DIAGNOSIS — Z23 Encounter for immunization: Secondary | ICD-10-CM

## 2020-02-27 NOTE — Progress Notes (Signed)
   Covid-19 Vaccination Clinic  Name:  Gina Ross    MRN: [id number] DOB: Aug 21, 1970  02/27/2020  Gina Ross was observed post Covid-19 immunization for 15 minutes without incident. She was provided with Vaccine Information Sheet and instruction to access the V-Safe system.   Gina Ross was instructed to call 911 with any severe reactions post vaccine: Marland Kitchen Difficulty breathing  . Swelling of face and throat  . A fast heartbeat  . A bad rash all over body  . Dizziness and weakness   Immunizations Administered    Name Date Dose VIS Date Route   Pfizer COVID-19 Vaccine 02/27/2020  9:26 AM 0.3 mL 08/22/2018 Intramuscular   Manufacturer: [employer]   Lot: [id number]   Equality: [address]-3

## 2020-02-27 NOTE — Therapy (Signed)
[place], [place], [address] Phone: [phone]   Fax:  [phone]  Physical Therapy Treatment  Patient Details  Name: Gina Ross MRN: [id number] Date of Birth: Oct 10, 1970 Referring Provider (PT): Dr. [name]   Encounter Date: 02/27/2020   PT End of Session - 02/27/20 1408    Visit Number 3    Number of Visits 10    Date for PT Re-Evaluation 03/24/20    Authorization Type [employer] Medicaid - out of network; treat up to 27 visits and no auth required as there is no ability to do an [place]    PT Start Time 1300    PT Stop Time 1400    PT Time Calculation (min) 60 min    Activity Tolerance Patient tolerated treatment well    Behavior During Therapy [facility] for tasks assessed/performed           Past Medical History:  Diagnosis Date  . Asthma    as a child  . Cancer ([facility]) 07/2019   breast  . Family history of breast cancer   . Family history of colon cancer   . Family history of prostate cancer     Past Surgical History:  Procedure Laterality Date  . COLONOSCOPY    . HARDWARE REMOVAL Right 09/11/2014   Procedure: Removal Deep Hardware Right Knee;  Surgeon: Newt Minion, MD;  Location: [facility];  Service: Orthopedics;  Laterality: Right;  . IR IMAGING GUIDED PORT INSERTION  09/04/2019  . MASTECTOMY WITH AXILLARY LYMPH NODE DISSECTION Right 01/24/2020   Procedure: RIGHT MASTECTOMY WITH AXILLARY LYMPH NODE DISSECTION;  Surgeon: [name], MD;  Location: [facility];  Service: General;  Laterality: Right;  PEC BLOCK  . ORIF PATELLA Right 03/22/2014   Procedure: Open Reduction Internal Fixation Right Patella ;  Surgeon: Newt Minion, MD;  Location: [facility];  Service: Orthopedics;  Laterality: Right;    There were no vitals filed for this visit.   Subjective Assessment - 02/27/20 1302    Subjective My ROM is getting there. I have been doing the exercises.    Pertinent History Patient  was diganosed on 08/14/2019 with right invasive ductal carcinoma breast cancer. It is ER positive, PR negative, and HER2 negative with a Ki67 of 90%. 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 She underwent a right mastectomy on 01/24/2020 with 0/5 nodes positive for cancer. She reports she will begin radiation soon but needs to improve shoulder ROM to get positioned.    Patient Stated Goals Get my arm moving so I can do radiation.    Currently in Pain? Yes    Pain Score 5     Pain Location Axilla    Pain Orientation Right    Pain Descriptors / Indicators Tightness;Sharp    Pain Type Surgical pain    Pain Onset More than a month ago    Pain Frequency Intermittent              [facility] PT Assessment - 02/27/20 0001      AROM   Right Shoulder Flexion 109 Degrees    Right Shoulder ABduction 110 Degrees                         [facility] Adult PT Treatment/Exercise - 02/27/20 0001      Shoulder Exercises: Pulleys  Flexion 2 minutes    Flexion Limitations not limited by pain    ABduction 2 minutes    ABduction Limitations not limited by pain      Manual Therapy   Manual Therapy Manual Lymphatic Drainage (MLD);Myofascial release;Passive ROM    Myofascial Release to cording in right axilla in to R upper arm     Manual Lymphatic Drainage (MLD) following myofascial release to cording: short neck, 5 diaphragmatic breaths, left axillary nodes and establishment of interaxillary pathway, right inguinal nodes and establishment of axillo inguinal pathway, R UE working proximal to distal and R axilla moving fluid towards pathways    Passive ROM to right shoulder with prolonged holds in to abduction and flexion - remeasured ROM at end of session and it has improved greatly since eval                       PT Long Term Goals - 02/25/20 1131       PT LONG TERM GOAL #1   Title Patient will demonstrate she has regained full shoulder ROM and function post operatively compared to baselines.    Time 4    Period Weeks    Status On-going    Target Date 03/24/20      PT LONG TERM GOAL #2   Title Patient will increase right shoulder active flexion to >/= 120 degrees for increased ease reaching overhead.    Baseline 85 degrees post op; 134 degrees pre-op    Time 4    Period Weeks    Status New    Target Date 03/24/20      PT LONG TERM GOAL #3   Title Patient will increase right shoulder active abduction to >/= 120 degrees for ability to obtain radiation positioning.    Baseline 78 degrees post op; 165 degrees pre-op    Time 4    Period Weeks    Target Date 03/24/20      PT LONG TERM GOAL #4   Title Patient will improve her DASH to </= 14 for improved overall upper extremity function.    Baseline 47.73 post op; 13.64 pre-op    Time 4    Period Weeks    Status New    Target Date 03/24/20      PT LONG TERM GOAL #5   Title Patient will verbalize good understanding of lympehdema risk reduction practices.    Baseline Limited / No knowledge    Time 4    Period Weeks    Status New    Target Date 03/24/20                 Plan - 02/27/20 1409    Clinical Impression Statement Pt demonstrating less pain today than at last session. She was able to tolerate manual therapy including PROM to R shoulder and myofascial release to R axilla. Pt has 5 palpable cords in R axilla that did loosen with therapy but none released. Pt is very tight across R pec with all stretches. Performed MLD following manual therapy to cording and to edema in R lateral trunk and axilla.    PT Frequency 2x / week    PT Duration 4 weeks    PT Treatment/Interventions ADLs/Self Care Home Management;Therapeutic exercise;Patient/family education;Manual techniques;Manual lymph drainage;Passive range of motion;Therapeutic activities;Scar mobilization    PT Next  Visit Plan PROM right shoulder; myofascial release to pt tolerance to release axillary cords; AAROM exercises    PT Home  Exercise Plan Post op shoulder ROM HEP    Consulted and Agree with Plan of Care Patient           Patient will benefit from skilled therapeutic intervention in order to improve the following deficits and impairments:  Postural dysfunction, Decreased range of motion, Impaired UE functional use, Pain, Decreased knowledge of precautions, Increased edema, Decreased scar mobility, Increased fascial restricitons  Visit Diagnosis: Stiffness of right shoulder, not elsewhere classified  Acute pain of right shoulder  Aftercare following surgery for neoplasm     Problem List Patient Active Problem List   Diagnosis Date Noted  . Breast cancer, right ([facility]) 01/24/2020  . Port-A-Cath in place 09/18/2019  . BRCA1 gene mutation positive in female 09/05/2019  . Genetic testing 09/05/2019  . Family history of breast cancer   . Family history of prostate cancer   . Family history of colon cancer   . Malignant neoplasm of overlapping sites of right breast in female, estrogen receptor positive ([facility]) 08/17/2019  . Unilateral primary osteoarthritis, right knee 01/31/2017    Allyson Sabal [facility] 02/27/2020, 2:11 PM  [place], [place], [address] Phone: [phone]   Fax:  [phone]  Name: Gina Ross MRN: [id number] Date of Birth: [birth year]  Manus Gunning, PT 02/27/20 2:11 PM

## 2020-03-04 ENCOUNTER — Encounter: Payer: Self-pay | Admitting: Rehabilitation

## 2020-03-04 ENCOUNTER — Institutional Professional Consult (permissible substitution): Payer: Medicaid Other | Admitting: Radiation Oncology

## 2020-03-04 ENCOUNTER — Ambulatory Visit: Payer: Medicaid Other | Admitting: Rehabilitation

## 2020-03-04 ENCOUNTER — Other Ambulatory Visit: Payer: Self-pay

## 2020-03-04 ENCOUNTER — Ambulatory Visit: Payer: Medicaid Other

## 2020-03-04 DIAGNOSIS — C50811 Malignant neoplasm of overlapping sites of right female breast: Secondary | ICD-10-CM

## 2020-03-04 DIAGNOSIS — R293 Abnormal posture: Secondary | ICD-10-CM

## 2020-03-04 DIAGNOSIS — M25511 Pain in right shoulder: Secondary | ICD-10-CM

## 2020-03-04 DIAGNOSIS — M25611 Stiffness of right shoulder, not elsewhere classified: Secondary | ICD-10-CM

## 2020-03-04 DIAGNOSIS — Z483 Aftercare following surgery for neoplasm: Secondary | ICD-10-CM

## 2020-03-04 NOTE — Therapy (Signed)
[place], [place], [address] Phone: [phone]   Fax:  [phone]  Physical Therapy Treatment  Patient Details  Name: Gina Ross MRN: [id number] Date of Birth: 1970-10-01 Referring Provider (PT): Dr. [name]   Encounter Date: 03/04/2020   PT End of Session - 03/04/20 1154    Visit Number 4    Number of Visits 10    Date for PT Re-Evaluation 03/24/20    Authorization Type [employer] - out of network; treat up to 27 visits and no auth required as there is no ability to do an [place]    Authorization - Visit Number 4    Authorization - Number of Visits 27    PT Start Time 1055    PT Stop Time 1150    PT Time Calculation (min) 55 min    Activity Tolerance Patient tolerated treatment well;Patient limited by pain    Behavior During Therapy [facility] for tasks assessed/performed           Past Medical History:  Diagnosis Date   Asthma    as a child   Cancer ([place]) 07/2019   breast   Family history of breast cancer    Family history of colon cancer    Family history of prostate cancer     Past Surgical History:  Procedure Laterality Date   COLONOSCOPY     HARDWARE REMOVAL Right 09/11/2014   Procedure: Removal Deep Hardware Right Knee;  Surgeon: Newt Minion, MD;  Location: [place];  Service: Orthopedics;  Laterality: Right;   IR IMAGING GUIDED PORT INSERTION  09/04/2019   MASTECTOMY WITH AXILLARY LYMPH NODE DISSECTION Right 01/24/2020   Procedure: RIGHT MASTECTOMY WITH AXILLARY LYMPH NODE DISSECTION;  Surgeon: [name], MD;  Location: [place];  Service: General;  Laterality: Right;  PEC BLOCK   ORIF PATELLA Right 03/22/2014   Procedure: Open Reduction Internal Fixation Right Patella ;  Surgeon: Newt Minion, MD;  Location: [place];  Service: Orthopedics;  Laterality: Right;    There were no vitals filed for this visit.   Subjective Assessment - 03/04/20 1055     Subjective It was really sore after last time and feels more swollen    Pertinent History Patient was diganosed on 08/14/2019 with right invasive ductal carcinoma breast cancer. It is ER positive, PR negative, and HER2 negative with a Ki67 of 90%. 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 She underwent a right mastectomy on 01/24/2020 with 0/5 nodes positive for cancer. She reports she will begin radiation soon but needs to improve shoulder ROM to get positioned.    Currently in Pain? Yes    Pain Score 5     Pain Location Axilla    Pain Orientation Right    Pain Descriptors / Indicators Tightness    Pain Type Surgical pain    Pain Onset More than a month ago    Pain Frequency Constant                             [facility] Adult PT Treatment/Exercise - 03/04/20 0001      Shoulder Exercises: Pulleys   Flexion 2 minutes    Flexion Limitations not limited by pain    ABduction 2 minutes    ABduction Limitations not limited by pain  Manual Therapy   Manual Therapy Edema management    Edema Management gave pt tg soft large fold on top to attempt to decrease some pain from cording    Myofascial Release to cording in right axilla in to R upper arm     Manual Lymphatic Drainage (MLD) following myofascial release to cording: right inguinal nodes and establishment of axillo inguinal pathway, R UE working proximal to distal and R axilla moving fluid towards pathways focus today on lateral trunk and then in sidelying lateral trunk focus    Passive ROM to the Rt shoulder to tolerance including some axillary blocking for flexion and abduction/D2, worked on prolonged stretch into radiation position slowly lowering as able with PT controlling height of pillow                       PT Long Term Goals - 02/25/20 1131      PT LONG TERM GOAL #1    Title Patient will demonstrate she has regained full shoulder ROM and function post operatively compared to baselines.    Time 4    Period Weeks    Status On-going    Target Date 03/24/20      PT LONG TERM GOAL #2   Title Patient will increase right shoulder active flexion to >/= 120 degrees for increased ease reaching overhead.    Baseline 85 degrees post op; 134 degrees pre-op    Time 4    Period Weeks    Status New    Target Date 03/24/20      PT LONG TERM GOAL #3   Title Patient will increase right shoulder active abduction to >/= 120 degrees for ability to obtain radiation positioning.    Baseline 78 degrees post op; 165 degrees pre-op    Time 4    Period Weeks    Target Date 03/24/20      PT LONG TERM GOAL #4   Title Patient will improve her DASH to </= 14 for improved overall upper extremity function.    Baseline 47.73 post op; 13.64 pre-op    Time 4    Period Weeks    Status New    Target Date 03/24/20      PT LONG TERM GOAL #5   Title Patient will verbalize good understanding of lympehdema risk reduction practices.    Baseline Limited / No knowledge    Time 4    Period Weeks    Status New    Target Date 03/24/20                 Plan - 03/04/20 1154    Clinical Impression Statement Decreased intensity of STM and myofasical release of cording due to severity of increased pain after last session.  Pt is overall limited by pain with palpation ofthe axilla but tolerated stretching with axillary blocking better.  Some lateral trunk edema present so pt may bring out her binder again and/or get prescription for bra next time.    PT Frequency 2x / week    PT Duration 4 weeks    PT Treatment/Interventions ADLs/Self Care Home Management;Therapeutic exercise;Patient/family education;Manual techniques;Manual lymph drainage;Passive range of motion;Therapeutic activities;Scar mobilization    PT Next Visit Plan how was tg soft on arm? any less pain after last session?  give compression bra prescription and direction if wanted, PROM right shoulder; myofascial release to pt tolerance to release axillary cords; AAROM exercises    Consulted and Agree with  Plan of Care Patient           Patient will benefit from skilled therapeutic intervention in order to improve the following deficits and impairments:  Postural dysfunction, Decreased range of motion, Impaired UE functional use, Pain, Decreased knowledge of precautions, Increased edema, Decreased scar mobility, Increased fascial restricitons  Visit Diagnosis: Stiffness of right shoulder, not elsewhere classified  Acute pain of right shoulder  Aftercare following surgery for neoplasm  Malignant neoplasm of overlapping sites of right breast in female, estrogen receptor positive ([place])  Abnormal posture     Problem List Patient Active Problem List   Diagnosis Date Noted   Breast cancer, right ([place]) 01/24/2020   Port-A-Cath in place 09/18/2019   BRCA1 gene mutation positive in female 09/05/2019   Genetic testing 09/05/2019   Family history of breast cancer    Family history of prostate cancer    Family history of colon cancer    Malignant neoplasm of overlapping sites of right breast in female, estrogen receptor positive ([place]) 08/17/2019   Unilateral primary osteoarthritis, right knee 01/31/2017    Stark Bray 03/04/2020, 11:57 AM  [place], [place], [address] Phone: [phone]   Fax:  [phone]  Name: Gina Ross MRN: [id number] Date of Birth: 05/21/1971

## 2020-03-06 ENCOUNTER — Ambulatory Visit: Payer: Medicaid Other

## 2020-03-06 ENCOUNTER — Other Ambulatory Visit: Payer: Self-pay

## 2020-03-06 DIAGNOSIS — M25511 Pain in right shoulder: Secondary | ICD-10-CM

## 2020-03-06 DIAGNOSIS — Z483 Aftercare following surgery for neoplasm: Secondary | ICD-10-CM

## 2020-03-06 DIAGNOSIS — M25611 Stiffness of right shoulder, not elsewhere classified: Secondary | ICD-10-CM | POA: Diagnosis not present

## 2020-03-06 DIAGNOSIS — Z170 Estrogen receptor positive status [ER+]: Secondary | ICD-10-CM

## 2020-03-06 DIAGNOSIS — R293 Abnormal posture: Secondary | ICD-10-CM

## 2020-03-06 NOTE — Therapy (Signed)
[place], [place], [address] Phone: [phone]   Fax:  [phone]  Physical Therapy Treatment  Patient Details  Name: Gina Ross MRN: [id number] Date of Birth: [birth year] Referring Provider (PT): Dr. [name]   Encounter Date: 03/06/2020   PT End of Session - 03/06/20 1340    Visit Number 5    Number of Visits 10    Date for PT Re-Evaluation 03/24/20    Authorization Type [employer] - out of network; treat up to 27 visits and no auth required as there is no ability to do an [place]    Authorization - Visit Number 5    Authorization - Number of Visits 27    PT Start Time 1006    PT Stop Time 1105    PT Time Calculation (min) 59 min    Activity Tolerance Patient tolerated treatment well    Behavior During Therapy [facility] for tasks assessed/performed           Past Medical History:  Diagnosis Date  . Asthma    as a child  . Cancer ([place]) 07/2019   breast  . Family history of breast cancer   . Family history of colon cancer   . Family history of prostate cancer     Past Surgical History:  Procedure Laterality Date  . COLONOSCOPY    . HARDWARE REMOVAL Right 09/11/2014   Procedure: Removal Deep Hardware Right Knee;  Surgeon: Newt Minion, MD;  Location: [place];  Service: Orthopedics;  Laterality: Right;  . IR IMAGING GUIDED PORT INSERTION  09/04/2019  . MASTECTOMY WITH AXILLARY LYMPH NODE DISSECTION Right 01/24/2020   Procedure: RIGHT MASTECTOMY WITH AXILLARY LYMPH NODE DISSECTION;  Surgeon: [name], MD;  Location: [place];  Service: General;  Laterality: Right;  PEC BLOCK  . ORIF PATELLA Right 03/22/2014   Procedure: Open Reduction Internal Fixation Right Patella ;  Surgeon: Newt Minion, MD;  Location: [place];  Service: Orthopedics;  Laterality: Right;    There were no vitals filed for this visit.   Subjective Assessment - 03/06/20 1009    Subjective The soreness  was a little less after last session but the swelling up under my arm and side is getting worse and bothering me. I really liked wearing the sleeve (TG soft) she gave me last time, though, that seemed to help.    Pertinent History Patient was diganosed on 08/14/2019 with right invasive ductal carcinoma breast cancer. It is ER positive, PR negative, and HER2 negative with a Ki67 of 90%. 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 She underwent a right mastectomy on 01/24/2020 with 0/5 nodes positive for cancer. She reports she will begin radiation soon but needs to improve shoulder ROM to get positioned.    Patient Stated Goals Get my arm moving so I can do radiation.    Currently in Pain? Yes    Pain Score 6     Pain Location Axilla    Pain Orientation Right    Pain Descriptors / Indicators Sharp;Tender    Pain Type Surgical pain    Pain Onset More than a month ago    Pain Frequency Constant    Aggravating Factors  trying to move my arm    Pain Relieving Factors wearing the new sleeve helped some  [facility] Adult PT Treatment/Exercise - 03/06/20 0001      Manual Therapy   Manual Therapy Soft tissue mobilization;Myofascial release;Passive ROM    Soft tissue mobilization With coconut oil to Rt pectoralis insertion and origin gently to her tolerance; good softening noted at insertion with some increased P/ROM by end of session    Myofascial Release to cording in right axilla in to R upper arm     Manual Lymphatic Drainage (MLD) --    Passive ROM to the Rt shoulder to tolerance including some axillary blocking for flexion and abduction/D2, worked on prolonged stretch into radiation position which she was able to get into passively by end of session                       PT Long Term Goals - 02/25/20 [place] #1   Title Patient will demonstrate she has regained full shoulder ROM and function post operatively compared to baselines.    Time 4    Period Weeks    Status On-going    Target Date 03/24/20      PT LONG TERM GOAL #2   Title Patient will increase right shoulder active flexion to >/= 120 degrees for increased ease reaching overhead.    Baseline 85 degrees post op; 134 degrees pre-op    Time 4    Period Weeks    Status New    Target Date 03/24/20      PT LONG TERM GOAL #3   Title Patient will increase right shoulder active abduction to >/= 120 degrees for ability to obtain radiation positioning.    Baseline 78 degrees post op; 165 degrees pre-op    Time 4    Period Weeks    Target Date 03/24/20      PT LONG TERM GOAL #4   Title Patient will improve her DASH to </= 14 for improved overall upper extremity function.    Baseline 47.73 post op; 13.64 pre-op    Time 4    Period Weeks    Status New    Target Date 03/24/20      PT LONG TERM GOAL #5   Title Patient will verbalize good understanding of lympehdema risk reduction practices.    Baseline Limited / No knowledge    Time 4    Period Weeks    Status New    Target Date 03/24/20                 Plan - 03/06/20 1340    Clinical Impression Statement Some good imporvement noted from last session note. Pt did start off slightly limited by pain at end P/ROM, but this improved well by end of session with increase motion noted by pt and therapist. She had 1 area on mastectomy incision that she reports had rubbed a scab off and this looked some irritated so did not perform any MFR that pulled at this area and instructed pt to keep an eye on this area over the weekend. She verbalized understanding. Pt was able to passively attain radiation positioning by end of session with no discomfort and was encouraged by this. Encouraged her to be mindful of avoiding guarded posturing over weekend and also be very mindful of  incorporating AA/A/ROM stretches on Rt shoulder into her day so she would not lose ROM attained today before radiation simulation next week. Pt verbalized understanding. Helped her don TG soft  at end of session as she reports wearing this has been helpful.    Personal Factors and Comorbidities Social Background   lives alone   Stability/Clinical Decision Making Stable/Uncomplicated    Rehab Potential Good    PT Frequency 2x / week    PT Duration 4 weeks    PT Treatment/Interventions ADLs/Self Care Home Management;Therapeutic exercise;Patient/family education;Manual techniques;Manual lymph drainage;Passive range of motion;Therapeutic activities;Scar mobilization    PT Next Visit Plan Might be able to try pulleys and ball roll up wall next; give compression bra prescription and direction if wanted, PROM right shoulder; myofascial release to pt tolerance to release axillary cords (how is incision where she rubbed scab off?); AAROM exercises    PT Home Exercise Plan Post op shoulder ROM HEP    Consulted and Agree with Plan of Care Patient           Patient will benefit from skilled therapeutic intervention in order to improve the following deficits and impairments:  Postural dysfunction, Decreased range of motion, Impaired UE functional use, Pain, Decreased knowledge of precautions, Increased edema, Decreased scar mobility, Increased fascial restricitons  Visit Diagnosis: Stiffness of right shoulder, not elsewhere classified  Acute pain of right shoulder  Aftercare following surgery for neoplasm  Malignant neoplasm of overlapping sites of right breast in female, estrogen receptor positive ([place])  Abnormal posture     Problem List Patient Active Problem List   Diagnosis Date Noted  . Breast cancer, right ([place]) 01/24/2020  . Port-A-Cath in place 09/18/2019  . BRCA1 gene mutation positive in female 09/05/2019  . Genetic testing 09/05/2019  . Family history of breast cancer   . Family  history of prostate cancer   . Family history of colon cancer   . Malignant neoplasm of overlapping sites of right breast in female, estrogen receptor positive ([place]) 08/17/2019  . Unilateral primary osteoarthritis, right knee 01/31/2017    Otelia Limes, PTA 03/06/2020, 1:46 PM  [place], [place], [address] Phone: [phone]   Fax:  [phone]  Name: Gina Ross MRN: [id number] Date of Birth: August 12, 1970

## 2020-03-07 ENCOUNTER — Encounter: Payer: Self-pay | Admitting: *Deleted

## 2020-03-12 ENCOUNTER — Encounter: Payer: Self-pay | Admitting: Radiation Oncology

## 2020-03-12 ENCOUNTER — Other Ambulatory Visit: Payer: Self-pay

## 2020-03-12 ENCOUNTER — Encounter: Payer: Self-pay | Admitting: General Practice

## 2020-03-12 ENCOUNTER — Encounter: Payer: Self-pay | Admitting: Rehabilitation

## 2020-03-12 ENCOUNTER — Ambulatory Visit: Payer: Medicaid Other | Admitting: Rehabilitation

## 2020-03-12 ENCOUNTER — Ambulatory Visit
Admission: RE | Admit: 2020-03-12 | Discharge: 2020-03-12 | Disposition: A | Discharge status: Home / Self Care | Payer: Medicaid Other | Source: Ambulatory Visit | Attending: Radiation Oncology | Admitting: Radiation Oncology

## 2020-03-12 VITALS — BP 147/100 | HR 86 | Temp 97.9°F | Resp 18 | Ht 64.0 in | Wt 155.2 lb

## 2020-03-12 DIAGNOSIS — R0789 Other chest pain: Secondary | ICD-10-CM | POA: Diagnosis not present

## 2020-03-12 DIAGNOSIS — C50811 Malignant neoplasm of overlapping sites of right female breast: Secondary | ICD-10-CM | POA: Diagnosis present

## 2020-03-12 DIAGNOSIS — Z79899 Other long term (current) drug therapy: Secondary | ICD-10-CM | POA: Diagnosis not present

## 2020-03-12 DIAGNOSIS — R293 Abnormal posture: Secondary | ICD-10-CM

## 2020-03-12 DIAGNOSIS — C50011 Malignant neoplasm of nipple and areola, right female breast: Secondary | ICD-10-CM

## 2020-03-12 DIAGNOSIS — Z170 Estrogen receptor positive status [ER+]: Secondary | ICD-10-CM

## 2020-03-12 DIAGNOSIS — R112 Nausea with vomiting, unspecified: Secondary | ICD-10-CM | POA: Diagnosis not present

## 2020-03-12 DIAGNOSIS — M25611 Stiffness of right shoulder, not elsewhere classified: Secondary | ICD-10-CM | POA: Diagnosis not present

## 2020-03-12 DIAGNOSIS — Z791 Long term (current) use of non-steroidal anti-inflammatories (NSAID): Secondary | ICD-10-CM | POA: Insufficient documentation

## 2020-03-12 DIAGNOSIS — R748 Abnormal levels of other serum enzymes: Secondary | ICD-10-CM | POA: Insufficient documentation

## 2020-03-12 DIAGNOSIS — D649 Anemia, unspecified: Secondary | ICD-10-CM | POA: Insufficient documentation

## 2020-03-12 DIAGNOSIS — R634 Abnormal weight loss: Secondary | ICD-10-CM | POA: Diagnosis not present

## 2020-03-12 DIAGNOSIS — Z483 Aftercare following surgery for neoplasm: Secondary | ICD-10-CM

## 2020-03-12 DIAGNOSIS — G629 Polyneuropathy, unspecified: Secondary | ICD-10-CM | POA: Diagnosis not present

## 2020-03-12 DIAGNOSIS — M25511 Pain in right shoulder: Secondary | ICD-10-CM

## 2020-03-12 NOTE — Therapy (Signed)
[place], [place], [address] Phone: [phone]   Fax:  [phone]  Physical Therapy Treatment  Patient Details  Name: Gina Ross MRN: [id number] Date of Birth: 1970/09/03 Referring Provider (PT): Dr. [name]   Encounter Date: 03/12/2020   PT End of Session - 03/12/20 0939    Visit Number 6    Number of Visits 10    Date for PT Re-Evaluation 03/24/20    Authorization - Visit Number 6    Authorization - Number of Visits 27    PT Start Time 0900    PT Stop Time 0938    PT Time Calculation (min) 38 min    Activity Tolerance Patient tolerated treatment well    Behavior During Therapy [facility] for tasks assessed/performed           Past Medical History:  Diagnosis Date  . Asthma    as a child  . Cancer ([place]) 07/2019   breast  . Family history of breast cancer   . Family history of colon cancer   . Family history of prostate cancer     Past Surgical History:  Procedure Laterality Date  . COLONOSCOPY    . HARDWARE REMOVAL Right 09/11/2014   Procedure: Removal Deep Hardware Right Knee;  Surgeon: Newt Minion, MD;  Location: [facility];  Service: Orthopedics;  Laterality: Right;  . IR IMAGING GUIDED PORT INSERTION  09/04/2019  . MASTECTOMY WITH AXILLARY LYMPH NODE DISSECTION Right 01/24/2020   Procedure: RIGHT MASTECTOMY WITH AXILLARY LYMPH NODE DISSECTION;  Surgeon: [name], MD;  Location: [place];  Service: General;  Laterality: Right;  PEC BLOCK  . ORIF PATELLA Right 03/22/2014   Procedure: Open Reduction Internal Fixation Right Patella ;  Surgeon: Newt Minion, MD;  Location: [facility];  Service: Orthopedics;  Laterality: Right;    There were no vitals filed for this visit.   Subjective Assessment - 03/12/20 0859    Subjective My allergies are just bad today.  A little sore but more movement.  I go today for the radiation thing today    Pertinent History Patient was diganosed on  08/14/2019 with right invasive ductal carcinoma breast cancer. It is ER positive, PR negative, and HER2 negative with a Ki67 of 90%. 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 She underwent a right mastectomy on 01/24/2020 with 0/5 nodes positive for cancer. She reports she will begin radiation soon but needs to improve shoulder ROM to get positioned.    Currently in Pain? No/denies    Pain Score 3     Pain Location Axilla    Pain Orientation Right    Pain Descriptors / Indicators Aching;Tender    Pain Type Surgical pain    Pain Onset More than a month ago    Pain Frequency Constant              [facility] PT Assessment - 03/12/20 0001      AROM   Right Shoulder Flexion 135 Degrees    Right Shoulder ABduction 127 Degrees                         [facility] Adult PT Treatment/Exercise - 03/12/20 0001      Shoulder Exercises: Supine   Flexion AAROM;Both;10 reps    Shoulder Flexion Weight (lbs) with dowel  Shoulder Exercises: Standing   ABduction AAROM;10 reps    ABduction Limitations with dowel in front of mirror      Shoulder Exercises: Pulleys   Flexion 2 minutes    ABduction 2 minutes      Shoulder Exercises: Therapy Ball   Flexion Both;10 reps    Flexion Limitations with cueing for initial instruction       Shoulder Exercises: Stretch   Corner Stretch 2 reps;20 seconds    Corner Stretch Limitations single arm in doorway stretch     Other Shoulder Stretches mermaid stretch 2" x 8,     Other Shoulder Stretches LTR with PT holding arm elevated slighty from table in D2 position for trunk and chest stretch       Manual Therapy   Soft tissue mobilization not performed today due to radiation right after treatment     Passive ROM to the Rt shoulder to tolerance including some axillary blocking for flexion and abduction/D2, much improved  today and no cording evident                        PT Long Term Goals - 03/12/20 0942      PT LONG TERM GOAL #1   Title Patient will demonstrate she has regained full shoulder ROM and function post operatively compared to baselines.    Baseline improved to flexion 135 and abduction 127 on 03/12/20    Status Partially Met      PT LONG TERM GOAL #2   Title Patient will increase right shoulder active flexion to >/= 120 degrees for increased ease reaching overhead.    Baseline 85 degrees post op; 134 degrees pre-op, 135 on 03/12/20    Status Achieved      PT LONG TERM GOAL #3   Title Patient will increase right shoulder active abduction to >/= 120 degrees for ability to obtain radiation positioning.    Baseline 78 degrees post op; 165 degrees pre-op, 127 on 03/12/20    Status Partially Met      PT LONG TERM GOAL #4   Title Patient will improve her DASH to </= 14 for improved overall upper extremity function.    Status On-going      PT LONG TERM GOAL #5   Title Patient will verbalize good understanding of lympehdema risk reduction practices.    Status On-going                 Plan - 03/12/20 0939    Clinical Impression Statement Pt doing much better today.  No cording evident in the UE and no limitations due to pain with all activities.  Was able to tolerate PROM free and advance shoulder exercises.  Discussed starting scar massage but did not perform this today or STM due to radiation simulation directly post treatment.    PT Frequency 2x / week    PT Duration 4 weeks    PT Treatment/Interventions ADLs/Self Care Home Management;Therapeutic exercise;Patient/family education;Manual techniques;Manual lymph drainage;Passive range of motion;Therapeutic activities;Scar mobilization    PT Next Visit Plan give discuss risk reduction to meet goal, Rt shoulder PROM, AAROM, stretching, STM and scar massage PRN, may be ready for supine scap soon    PT Home Exercise Plan Post op  shoulder ROM HEP, turning point handout part 2 with single arm doorway, teapot stretch and LTR with arms in T    Consulted and Agree with Plan of Care Patient  Patient will benefit from skilled therapeutic intervention in order to improve the following deficits and impairments:     Visit Diagnosis: Stiffness of right shoulder, not elsewhere classified  Acute pain of right shoulder  Aftercare following surgery for neoplasm  Malignant neoplasm of overlapping sites of right breast in female, estrogen receptor positive ([place])  Abnormal posture     Problem List Patient Active Problem List   Diagnosis Date Noted  . Breast cancer, right ([place]) 01/24/2020  . Port-A-Cath in place 09/18/2019  . BRCA1 gene mutation positive in female 09/05/2019  . Genetic testing 09/05/2019  . Family history of breast cancer   . Family history of prostate cancer   . Family history of colon cancer   . Malignant neoplasm of overlapping sites of right breast in female, estrogen receptor positive ([place]) 08/17/2019  . Unilateral primary osteoarthritis, right knee 01/31/2017    Stark Bray 03/12/2020, 9:44 AM  [place], [place], [address] Phone: [phone]   Fax:  [phone]  Name: Gina Ross MRN: [id number] Date of Birth: 04-22-1971

## 2020-03-12 NOTE — Progress Notes (Signed)
Radiation Oncology         [phone] ________________________________  Name: Gina Ross MRN: [id number]  Date: 03/12/2020  DOB: 19-Aug-1970  Re-Evaluation Note  CC: Patient, [name]  [name], MD    ICD-10-CM   1. Malignant neoplasm involving both nipple and areola of right breast in female, estrogen receptor positive ([place])  C50.011 Ambulatory referral to Social Work   Z17.0   2. Malignant neoplasm of overlapping sites of right breast in female, estrogen receptor positive ([place])  C50.811    Z17.0     Diagnosis:  Stage IIIC (ypT0, ypN0) Right Breast Invasive Ductal Carcinoma, ER+ / PR- / Her2-, Grade 3  Narrative:  The patient returns today to discuss radiation treatment options. She was seen in the multidisciplinary breast clinic on 08/22/2019. At that time, it was recommended that she proceed with genetic testing, port placement, echocardiogram, chemotherapy class, breast MRI, and CT of chest/abdomen/pelvis and bone scan for staging.  Genetic testing performed on 08/22/2019 was positive. A pathogenic variant was detected in the BRCA-1 gene.  Bone scan on 08/29/2019 did not show any scintigraphic evidence of osseous metastatic disease. Echocardiogram performed on that same day showed an EF of 60-65%.  CT scan of chest, abdomen, and pelvis on 08/31/2019 revealed a very large central right breast mass that measured at least 7.0 cm and appeared to maintain a distinct fat place from the underlying pectoral muscle. There was also noted to be bulky right axillary and subpectoral lymph nodes, consistent with metastatic disease. However, there was no evidence of metastatic disease within the chest, abdomen, or pelvis.  MRI of bilateral breasts on 09/03/2019 showed a biopsy-proven malignancy involving the entire central and lateral right breast. The mass measured 7.2 cm in the greatest dimension. There was also suspicious skin thickening and enhancement involving the entire lateral  right breast and chest wall as well as portions of the lower inner quadrant. Additionally, there were more than seven markedly abnormal level 1 right axillary lymph nodes. Abnormal level 2 and 3 lymph noes were also partially visualized. Finally, there was an indeterminate 6 mm focus of irregular enhancement in the superior central left breast.  The patient was seen in follow-up with Dr. Lindi Adie on 09/03/2019. At that time, she began cycle #1 of neoadjuvant chemotherapy with Adriamycin and Cytoxan dose dense x4 followed by Taxol x12. It was recommended that chemotherapy be followed by breast mastectomy and axillary lymph node dissection, adjuvant radiation therapy, and adjuvant anti-estrogen therapy. The patient has tolerated chemotherapy with the following toxicities: nausea, vomiting, weight loss, cytopenia, anemia, elevated liver enzymes, and moderately severe peripheral neuropathy. Taxol was discontinued after cycle #8 secondary to worsening neuropathy.   MRI of bilateral breasts on 01/07/2020 showed treatment response with residual 3.5 x 4.0 cm ill-defined persistent non mass-like enhancement at the site of the biopsy-proven malignancy. Right axillary lymph nodes appeared normal. Also, the previously identified central left breast focus had decreased in size.  The patient underwent a right modified radical mastectomy on 01/24/2020 that was performed by Dr. [name]. Pathology from the procedure did not reveal any residual carcinoma. Five lymph nodes were biopsied and all were negative for carcinoma.  The patient was last seen by Dr. Lindi Adie on 01/31/2020. She is to return for anti-estrogen therapy after the completion of adjuvant radiation.  Of note, the patient received her first dose of the COVID-19 vaccination on 02/27/2020.  On review of systems, the patient reports mild right chest wall pain.  Right arm mobility is improving with physical therapy   Allergies:  is allergic to  penicillins.  Meds: Current Outpatient Medications  Medication Sig Dispense Refill  . acetaminophen (TYLENOL) 325 MG tablet Take 650 mg by mouth every 6 (six) hours as needed for moderate pain or headache.     . cetirizine (ZYRTEC) 10 MG tablet Take 10 mg by mouth daily as needed for allergies.    Marland Kitchen gabapentin (NEURONTIN) 100 MG capsule Take 100-300 mg by mouth See admin instructions. Take 100 mg at bedtime, may increase up to 300 mg at bedtime as needed for pain    . gabapentin (NEURONTIN) 100 MG capsule Take 1 capsule (100 mg total) by mouth at bedtime. 30 capsule 1  . ibuprofen (ADVIL) 800 MG tablet Take 800 mg by mouth every 8 (eight) hours as needed for moderate pain.    Marland Kitchen lidocaine-prilocaine (EMLA) cream Apply to affected area once (Patient taking differently: Apply 1 application topically daily as needed (port access). ) 30 g 3  . LORazepam (ATIVAN) 0.5 MG tablet Take 1 tablet (0.5 mg total) by mouth at bedtime as needed (Nausea or vomiting). 30 tablet 1  . methocarbamol (ROBAXIN) 500 MG tablet Take 1 tablet (500 mg total) by mouth 3 (three) times daily. 30 tablet 1  . ondansetron (ZOFRAN) 8 MG tablet Take 1 tablet (8 mg total) by mouth 2 (two) times daily as needed. Start on the third day after chemotherapy. 30 tablet 1  . oxyCODONE (OXY IR/ROXICODONE) 5 MG immediate release tablet Take 1 tablet (5 mg total) by mouth every 4 (four) hours as needed for moderate pain. 12 tablet 0  . prochlorperazine (COMPAZINE) 10 MG tablet Take 1 tablet (10 mg total) by mouth every 6 (six) hours as needed (Nausea or vomiting). 30 tablet 1  . traMADol (ULTRAM) 50 MG tablet Take 1 tablet (50 mg total) by mouth every 6 (six) hours as needed. 12 tablet 0   No current facility-administered medications for this encounter.    Physical Findings: The patient is in no acute distress. Patient is alert and oriented.  height is _0  (1.626 m) and weight is 155 lb 4 oz (70.4 kg). Her oral temperature is 97.9 F  (36.6 C). Her blood pressure is 147/100 (abnormal) and her pulse is 86. Her respiration is 18 and oxygen saturation is 100%.  No significant changes. Lungs are clear to auscultation bilaterally. Heart has regular rate and rhythm. No palpable cervical, supraclavicular, or axillary adenopathy. Abdomen soft, non-tender, normal bowel sounds. Left breast: no palpable mass, nipple discharge or bleeding. Right chest wall area shows mastectomy scar which is healed well without signs of drainage or infection.  The patient has reasonable range of movement in the right arm and shoulder she would be able to place her arm in the treatment position at this time.  Lab Findings: Lab Results  Component Value Date   WBC 3.4 (L) 01/17/2020   HGB 13.1 01/17/2020   HCT 39.3 01/17/2020   MCV 93.6 01/17/2020   PLT 254 01/17/2020    Radiographic Findings: No results found.  Impression: Stage IIIC (ypT0, ypN0) Right Breast Invasive Ductal Carcinoma, ER+ / PR- / Her2-, Grade 3  The patient has had an excellent response to her neoadjuvant chemotherapy as above.  Given the significant disease at the time of presentation within the breast and axillary region I would however recommend comprehensive postmastectomy radiation therapy in this situation.  I discussed the general course of treatment side  effects and potential toxicities of radiation therapy in this situation with patient.  She appears to understand and wishes to proceed with planned course of treatment.  Plan:  Patient is scheduled for CT simulation on January 20 treatments to begin approximately a week later.  Anticipate 6 weeks of postmastectomy radiation therapy.  Total time spent in this encounter was 35 minutes which included reviewing the patient's most recent breast MRIs, CT of chest/abdomen/pelvis, bone scan, chemotherapy, follow-ups, mastectomy, biopsy, pathology, physical examination, and documentation.  -----------------------------------  Blair Promise, PhD, MD  This document serves as a record of services personally performed by [name], MD. It was created on his behalf by Clerance Lav, a trained medical scribe. The creation of this record is based on the scribe's personal observations and the provider's statements to them. This document has been checked and approved by the attending provider.

## 2020-03-12 NOTE — Progress Notes (Signed)
[place] Psychosocial Distress Screening Clinical Social Work  Clinical Social Work was referred by distress screening protocol.  The patient scored a 8 on the Psychosocial Distress Thermometer which indicates moderate distress. Clinical Social Worker contacted patient by phone to assess for distress and other psychosocial needs. Patient has been working w [employer] on a variety of financial support options - all are in process at this time.  She has also applied for Social Security disability.  She knows to continue to follow up w CSW [place] as needed.  She has many needs, mostly financial - she has received the help available to her from [facility] and outside resources.  She is aware that getting disability and rental assistance is a process that takes time.  No current acute needs.   ONCBCN DISTRESS SCREENING 03/12/2020  Screening Type Initial Screening  Distress experienced in past week (1-10) 8  Emotional problem type   Referral to clinical social work Yes  Referral to support programs     Clinical Social Worker follow up needed: No.  If yes, follow up plan:  Beverely Pace, [place], LCSW Clinical Social Worker Phone:  [phone]

## 2020-03-12 NOTE — Progress Notes (Signed)
Patien there for a consult with Dr. Sondra Come.  Encounter Date:  01/31/2020          Related Problems  Malignant neoplasm of overlapping sites of right breast in female, estrogen receptor positive ([place])         Show:Clear all _0 Manual_1 Template_2 Copied  Added by: _3 [name], MD  _4 Hover for details 08/17/19:Patient palpated a right breast mass and right axilla mass and noted skin redness x2 weeks. She went to the ED and had an abscess drained with no improvement. Diagnostic mammogram and US showed a 7.3cm right breast mass extending into all 4 quadrants and partially into overlying skin with 12 abnormal right axillary lymph nodes. 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  Treatment plan: 1. Neoadjuvant chemotherapy with Adriamycin and Cytoxan dose dense 4 followed byTaxolweekly 8stopped after cycle 8 for neuropathy 2. Followed by breastmastectomy and axillary lymph node dissection 3. Followed by adjuvant radiation therapy 4.Follow-up adjuvant antiestrogen therapy  CT CAP 08/31/2019: Very large central right breast mass, bulky right axillary and subpectoral lymph nodes. No evidence of distant metastatic disease. Bulky uterine fibroids Bone scan 08/31/2019: No metastatic disease in the bones ---------------------------------------------------------------------------------------------------------------------------------------------------- Breast MRI: 01/07/2020: Treatment response with residual 3.5 x 4 cm ill-defined persistent non-mass-like enhancement at the site of the biopsy-proven malignancy.  Previous mass was 7.2 cm.  Normal right axillary lymph nodes.  Decreased size of the left breast focus.  Worsening peripheral neuropathy: Started on gabapentin.  I discussed with her that time will improve her symptoms slowly.  Gabapentin is meant to decrease the discomfort  associated with the neuropathy.  01/24/2020: Right mastectomy: No residual cancer identified, 0/5 lymph nodes negative treatment effect in the breast and the lymph nodes.  ER 40% weak staining, PR negative, HER-2 negative, Ki-67 90%, complete pathologic response  Pathology counseling: I discussed the final pathology report of the patient provided  a copy of this report. I discussed the margins as well as lymph node surgeries. We also discussed the final staging along with previously performed ER/PR and HER-2/neu testing.  Treatment plan: Adjuvant radiation followed by Bartholomew Boards              Dr. Lindi Adie  Past/Anticipated interventions by medical oncology, if any: Chemotherapy   Lymphedema issues, if any: going for tx. Has problems lifting her right arm.  Pain issues, if any: right breast '8"  SAFETY ISSUES:  Prior radiation? no  Pacemaker/ICD? no  Possible current pregnancy? postmenopausal  Is the patient on methotrexate?no  Current Complaints / other details:  BP (!) 147/100 (BP Location: Left Arm, Patient Position: Sitting)   Pulse 86   Temp 97.9 F (36.6 C) (Oral)   Resp 18   Ht _5  (1.626 m)   Wt 155 lb 4 oz (70.4 kg)   SpO2 100%   BMI 26.65 kg/m   Wt Readings from Last 3 Encounters:  03/12/20 155 lb 4 oz (70.4 kg)  01/31/20 151 lb 14.4 oz (68.9 kg)  01/24/20 153 lb 10.6 oz (69.7 kg)      De Burrs, RN 03/12/2020,9:15 AM

## 2020-03-13 ENCOUNTER — Encounter: Payer: Self-pay | Admitting: Rehabilitation

## 2020-03-13 ENCOUNTER — Encounter: Payer: Self-pay | Admitting: *Deleted

## 2020-03-13 ENCOUNTER — Ambulatory Visit: Payer: Medicaid Other | Admitting: Rehabilitation

## 2020-03-13 DIAGNOSIS — Z483 Aftercare following surgery for neoplasm: Secondary | ICD-10-CM

## 2020-03-13 DIAGNOSIS — M25611 Stiffness of right shoulder, not elsewhere classified: Secondary | ICD-10-CM

## 2020-03-13 DIAGNOSIS — Z170 Estrogen receptor positive status [ER+]: Secondary | ICD-10-CM

## 2020-03-13 DIAGNOSIS — M25511 Pain in right shoulder: Secondary | ICD-10-CM

## 2020-03-13 DIAGNOSIS — R293 Abnormal posture: Secondary | ICD-10-CM

## 2020-03-13 NOTE — Therapy (Addendum)
[place], [place], [address] Phone: [phone]   Fax:  [phone]  Physical Therapy Treatment  Patient Details  Name: Gina Ross MRN: [id number] Date of Birth: 1970/12/10 Referring Provider (PT): Dr. [name]   Encounter Date: 03/13/2020   PT End of Session - 03/13/20 1153    Visit Number 7    Number of Visits 10    Date for PT Re-Evaluation 03/24/20    Authorization Type [employer] Medicaid - out of network; treat up to 27 visits and no auth required as there is no ability to do an [place]    PT Start Time 1110    PT Stop Time 1153    PT Time Calculation (min) 43 min    Activity Tolerance Patient tolerated treatment well    Behavior During Therapy [facility] for tasks assessed/performed           Past Medical History:  Diagnosis Date  . Asthma    as a child  . Cancer ([place]) 07/2019   breast  . Family history of breast cancer   . Family history of colon cancer   . Family history of prostate cancer     Past Surgical History:  Procedure Laterality Date  . COLONOSCOPY    . HARDWARE REMOVAL Right 09/11/2014   Procedure: Removal Deep Hardware Right Knee;  Surgeon: Newt Minion, MD;  Location: [place] [facility];  Service: Orthopedics;  Laterality: Right;  . IR IMAGING GUIDED PORT INSERTION  09/04/2019  . MASTECTOMY WITH AXILLARY LYMPH NODE DISSECTION Right 01/24/2020   Procedure: RIGHT MASTECTOMY WITH AXILLARY LYMPH NODE DISSECTION;  Surgeon: [name], MD;  Location: [place];  Service: General;  Laterality: Right;  PEC BLOCK  . ORIF PATELLA Right 03/22/2014   Procedure: Open Reduction Internal Fixation Right Patella ;  Surgeon: Newt Minion, MD;  Location: [place];  Service: Orthopedics;  Laterality: Right;    There were no vitals filed for this visit.   Subjective Assessment - 03/13/20 1111    Subjective I just talked to someone at radiation and I do the simulation on the 03/17/20     Pertinent History Patient was diganosed on 08/14/2019 with right invasive ductal carcinoma breast cancer. It is ER positive, PR negative, and HER2 negative with a Ki67 of 90%. 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 She underwent a right mastectomy on 01/24/2020 with 0/5 nodes positive for cancer. She reports she will begin radiation soon but needs to improve shoulder ROM to get positioned.    Patient Stated Goals Get my arm moving so I can do radiation.    Currently in Pain? No/denies                 LYMPHEDEMA/ONCOLOGY QUESTIONNAIRE - 03/13/20 0001      Right Upper Extremity Lymphedema   15 cm Proximal to Olecranon Process 26.2 cm    10 cm Proximal to Olecranon Process 24.2 cm                      [facility] Adult PT Treatment/Exercise - 03/13/20 0001      Shoulder Exercises: Standing   External Rotation Both;10 reps    Theraband Level (Shoulder External Rotation) Level 1 (Yellow)      Shoulder Exercises: Pulleys   Flexion 2 minutes    Flexion Limitations vcs to go slow  ABduction 2 minutes    ABduction Limitations not limited by pain      Shoulder Exercises: Therapy Ball   Flexion Both;10 reps      Manual Therapy   Manual therapy comments pt reported Dr. Sondra Come thought she had some "mild lymphedema" the UE remains mostly unchanged but pt does have some lateral trunk puffiness he was most likely addressing     Edema Management pt given prescription for bras as needed     Soft tissue mobilization with cocoa butter; to the pectoralis, latissimus in neutral and moderate stretch    Passive ROM to the Rt shoulder to tolerance including some axillary blocking for flexion and abduction/D2                       PT Long Term Goals - 03/12/20 0942      PT LONG TERM GOAL #1   Title Patient will demonstrate she has regained  full shoulder ROM and function post operatively compared to baselines.    Baseline improved to flexion 135 and abduction 127 on 03/12/20    Status Partially Met      PT LONG TERM GOAL #2   Title Patient will increase right shoulder active flexion to >/= 120 degrees for increased ease reaching overhead.    Baseline 85 degrees post op; 134 degrees pre-op, 135 on 03/12/20    Status Achieved      PT LONG TERM GOAL #3   Title Patient will increase right shoulder active abduction to >/= 120 degrees for ability to obtain radiation positioning.    Baseline 78 degrees post op; 165 degrees pre-op, 127 on 03/12/20    Status Partially Met      PT LONG TERM GOAL #4   Title Patient will improve her DASH to </= 14 for improved overall upper extremity function.    Status On-going      PT LONG TERM GOAL #5   Title Patient will verbalize good understanding of lympehdema risk reduction practices.    Status On-going                 Plan - 03/13/20 1153    Clinical Impression Statement Pt continues to improve PROM, AROM and activity tolerance.  Still limitations in to flexion and abduction end range with pectoralis tightness and some lateral trunk puffiness.    Stability/Clinical Decision Making Stable/Uncomplicated    PT Frequency 2x / week    PT Duration 4 weeks    PT Treatment/Interventions ADLs/Self Care Home Management;Therapeutic exercise;Patient/family education;Manual techniques;Manual lymph drainage;Passive range of motion;Therapeutic activities;Scar mobilization    PT Next Visit Plan give discuss risk reduction to meet goal, Rt shoulder PROM, AAROM, stretching, STM and scar massage PRN, may be ready for supine scap soon    PT Home Exercise Plan Post op shoulder ROM HEP, turning point handout part 2 with single arm doorway, teapot stretch and LTR with arms in T    Recommended Other Services has bra prescription    Consulted and Agree with Plan of Care Patient           Patient will  benefit from skilled therapeutic intervention in order to improve the following deficits and impairments:  Postural dysfunction, Decreased range of motion, Impaired UE functional use, Pain, Decreased knowledge of precautions, Increased edema, Decreased scar mobility, Increased fascial restricitons  Visit Diagnosis: Stiffness of right shoulder, not elsewhere classified  Acute pain of right shoulder  Aftercare following surgery for neoplasm  Malignant neoplasm of overlapping sites of right breast in female, estrogen receptor positive ([place])  Abnormal posture     Problem List Patient Active Problem List   Diagnosis Date Noted  . Breast cancer, right ([place]) 01/24/2020  . Port-A-Cath in place 09/18/2019  . BRCA1 gene mutation positive in female 09/05/2019  . Genetic testing 09/05/2019  . Family history of breast cancer   . Family history of prostate cancer   . Family history of colon cancer   . Malignant neoplasm of overlapping sites of right breast in female, estrogen receptor positive ([place]) 08/17/2019  . Unilateral primary osteoarthritis, right knee 01/31/2017    Stark Bray 03/13/2020, 11:55 AM  [place], [place], [address] Phone: [phone]   Fax:  [phone]  Name: Gina Ross MRN: [id number] Date of Birth: Dec 23, 1970  PHYSICAL THERAPY DISCHARGE SUMMARY  Visits from Start of Care:7  Current functional level related to goals / functional outcomes: See above   Remaining deficits: See above   Education / Equipment: See above Plan: Patient agrees to discharge.  Patient goals were not met. Patient is being discharged due to not returning since the last visit.  ?????     Shan Levans, PT

## 2020-03-17 ENCOUNTER — Ambulatory Visit
Admission: RE | Admit: 2020-03-17 | Discharge: 2020-03-17 | Disposition: A | Discharge status: Home / Self Care | Payer: Medicaid Other | Source: Ambulatory Visit | Attending: Radiation Oncology | Admitting: Radiation Oncology

## 2020-03-17 ENCOUNTER — Other Ambulatory Visit: Payer: Self-pay

## 2020-03-17 DIAGNOSIS — Z170 Estrogen receptor positive status [ER+]: Secondary | ICD-10-CM | POA: Insufficient documentation

## 2020-03-17 DIAGNOSIS — C50811 Malignant neoplasm of overlapping sites of right female breast: Secondary | ICD-10-CM | POA: Insufficient documentation

## 2020-03-17 DIAGNOSIS — Z510 Encounter for antineoplastic radiation therapy: Secondary | ICD-10-CM | POA: Diagnosis not present

## 2020-03-17 DIAGNOSIS — C773 Secondary and unspecified malignant neoplasm of axilla and upper limb lymph nodes: Secondary | ICD-10-CM | POA: Diagnosis not present

## 2020-03-19 ENCOUNTER — Other Ambulatory Visit: Payer: Self-pay

## 2020-03-19 ENCOUNTER — Inpatient Hospital Stay: Payer: Medicaid Other

## 2020-03-19 DIAGNOSIS — Z23 Encounter for immunization: Secondary | ICD-10-CM

## 2020-03-19 NOTE — Progress Notes (Signed)
° °  Covid-19 Vaccination Clinic  Name:  Gina Ross    MRN: [id number] DOB: 17-Apr-1971  03/19/2020  Gina Ross was observed post Covid-19 immunization for 15 minutes without incident. She was provided with Vaccine Information Sheet and instruction to access the V-Safe system.   Gina Ross was instructed to call 911 with any severe reactions post vaccine:  Difficulty breathing   Swelling of face and throat   A fast heartbeat   A bad rash all over body   Dizziness and weakness   Immunizations Administered    Name Date Dose VIS Date Route   Pfizer COVID-19 Vaccine 03/19/2020  9:26 AM 0.3 mL 08/22/2018 Intramuscular   Manufacturer: [place]   Lot: [place]   [place]: [address]-1    2

## 2020-03-21 ENCOUNTER — Ambulatory Visit: Payer: Medicaid Other

## 2020-03-21 DIAGNOSIS — Z510 Encounter for antineoplastic radiation therapy: Secondary | ICD-10-CM | POA: Diagnosis not present

## 2020-03-24 ENCOUNTER — Encounter: Payer: Self-pay | Admitting: *Deleted

## 2020-03-24 ENCOUNTER — Other Ambulatory Visit: Payer: Self-pay

## 2020-03-24 ENCOUNTER — Ambulatory Visit
Admission: RE | Admit: 2020-03-24 | Discharge: 2020-03-24 | Disposition: A | Discharge status: Home / Self Care | Payer: Medicaid Other | Source: Ambulatory Visit | Attending: Radiation Oncology | Admitting: Radiation Oncology

## 2020-03-25 ENCOUNTER — Ambulatory Visit
Admission: RE | Admit: 2020-03-25 | Discharge: 2020-03-25 | Disposition: A | Discharge status: Home / Self Care | Payer: Medicaid Other | Source: Ambulatory Visit | Attending: Radiation Oncology | Admitting: Radiation Oncology

## 2020-03-25 ENCOUNTER — Other Ambulatory Visit: Payer: Self-pay

## 2020-03-25 DIAGNOSIS — C50011 Malignant neoplasm of nipple and areola, right female breast: Secondary | ICD-10-CM

## 2020-03-25 DIAGNOSIS — Z510 Encounter for antineoplastic radiation therapy: Secondary | ICD-10-CM | POA: Diagnosis not present

## 2020-03-25 MED ORDER — RADIAPLEXRX EX GEL: Freq: Once | CUTANEOUS | Status: AC

## 2020-03-25 MED ORDER — ALRA NON-METALLIC DEODORANT (RAD-ONC)
1.0000 "application " | Freq: Once | TOPICAL | Status: AC
  Administered 2020-03-25: 1 via TOPICAL

## 2020-03-26 ENCOUNTER — Ambulatory Visit
Admission: RE | Admit: 2020-03-26 | Discharge: 2020-03-26 | Disposition: A | Discharge status: Home / Self Care | Payer: Medicaid Other | Source: Ambulatory Visit | Attending: Radiation Oncology | Admitting: Radiation Oncology

## 2020-03-26 DIAGNOSIS — Z510 Encounter for antineoplastic radiation therapy: Secondary | ICD-10-CM | POA: Diagnosis not present

## 2020-03-27 ENCOUNTER — Ambulatory Visit
Admission: RE | Admit: 2020-03-27 | Discharge: 2020-03-27 | Disposition: A | Discharge status: Home / Self Care | Payer: Medicaid Other | Source: Ambulatory Visit | Attending: Radiation Oncology | Admitting: Radiation Oncology

## 2020-03-27 DIAGNOSIS — Z510 Encounter for antineoplastic radiation therapy: Secondary | ICD-10-CM | POA: Diagnosis not present

## 2020-03-28 ENCOUNTER — Other Ambulatory Visit: Payer: Self-pay

## 2020-03-28 ENCOUNTER — Ambulatory Visit
Admission: RE | Admit: 2020-03-28 | Discharge: 2020-03-28 | Disposition: A | Discharge status: Home / Self Care | Payer: Medicaid Other | Source: Ambulatory Visit | Attending: Radiation Oncology | Admitting: Radiation Oncology

## 2020-03-28 DIAGNOSIS — Z170 Estrogen receptor positive status [ER+]: Secondary | ICD-10-CM | POA: Insufficient documentation

## 2020-03-28 DIAGNOSIS — C773 Secondary and unspecified malignant neoplasm of axilla and upper limb lymph nodes: Secondary | ICD-10-CM | POA: Diagnosis not present

## 2020-03-28 DIAGNOSIS — Z510 Encounter for antineoplastic radiation therapy: Secondary | ICD-10-CM | POA: Diagnosis present

## 2020-03-28 DIAGNOSIS — C50811 Malignant neoplasm of overlapping sites of right female breast: Secondary | ICD-10-CM | POA: Diagnosis present

## 2020-03-31 ENCOUNTER — Ambulatory Visit
Admission: RE | Admit: 2020-03-31 | Discharge: 2020-03-31 | Disposition: A | Discharge status: Home / Self Care | Payer: Medicaid Other | Source: Ambulatory Visit | Attending: Radiation Oncology | Admitting: Radiation Oncology

## 2020-03-31 DIAGNOSIS — Z510 Encounter for antineoplastic radiation therapy: Secondary | ICD-10-CM | POA: Diagnosis not present

## 2020-04-01 ENCOUNTER — Ambulatory Visit
Admission: RE | Admit: 2020-04-01 | Discharge: 2020-04-01 | Disposition: A | Discharge status: Home / Self Care | Payer: Medicaid Other | Source: Ambulatory Visit | Attending: Radiation Oncology | Admitting: Radiation Oncology

## 2020-04-01 DIAGNOSIS — Z510 Encounter for antineoplastic radiation therapy: Secondary | ICD-10-CM | POA: Diagnosis not present

## 2020-04-02 ENCOUNTER — Ambulatory Visit
Admission: RE | Admit: 2020-04-02 | Discharge: 2020-04-02 | Disposition: A | Discharge status: Home / Self Care | Payer: Medicaid Other | Source: Ambulatory Visit | Attending: Radiation Oncology | Admitting: Radiation Oncology

## 2020-04-02 DIAGNOSIS — Z510 Encounter for antineoplastic radiation therapy: Secondary | ICD-10-CM | POA: Diagnosis not present

## 2020-04-03 ENCOUNTER — Ambulatory Visit
Admission: RE | Admit: 2020-04-03 | Discharge: 2020-04-03 | Disposition: A | Discharge status: Home / Self Care | Payer: Medicaid Other | Source: Ambulatory Visit | Attending: Radiation Oncology | Admitting: Radiation Oncology

## 2020-04-03 DIAGNOSIS — Z510 Encounter for antineoplastic radiation therapy: Secondary | ICD-10-CM | POA: Diagnosis not present

## 2020-04-04 ENCOUNTER — Ambulatory Visit
Admission: RE | Admit: 2020-04-04 | Discharge: 2020-04-04 | Disposition: A | Discharge status: Home / Self Care | Payer: Medicaid Other | Source: Ambulatory Visit | Attending: Radiation Oncology | Admitting: Radiation Oncology

## 2020-04-04 DIAGNOSIS — Z510 Encounter for antineoplastic radiation therapy: Secondary | ICD-10-CM | POA: Diagnosis not present

## 2020-04-07 ENCOUNTER — Ambulatory Visit
Admission: RE | Admit: 2020-04-07 | Discharge: 2020-04-07 | Disposition: A | Discharge status: Home / Self Care | Payer: Medicaid Other | Source: Ambulatory Visit | Attending: Radiation Oncology | Admitting: Radiation Oncology

## 2020-04-07 DIAGNOSIS — Z510 Encounter for antineoplastic radiation therapy: Secondary | ICD-10-CM | POA: Diagnosis not present

## 2020-04-08 ENCOUNTER — Ambulatory Visit: Payer: Medicaid Other

## 2020-04-08 ENCOUNTER — Ambulatory Visit
Admission: RE | Admit: 2020-04-08 | Discharge: 2020-04-08 | Disposition: A | Discharge status: Home / Self Care | Payer: Medicaid Other | Source: Ambulatory Visit | Attending: Radiation Oncology | Admitting: Radiation Oncology

## 2020-04-08 DIAGNOSIS — Z170 Estrogen receptor positive status [ER+]: Secondary | ICD-10-CM

## 2020-04-08 DIAGNOSIS — C50811 Malignant neoplasm of overlapping sites of right female breast: Secondary | ICD-10-CM

## 2020-04-08 DIAGNOSIS — Z510 Encounter for antineoplastic radiation therapy: Secondary | ICD-10-CM | POA: Diagnosis not present

## 2020-04-08 MED ORDER — SONAFINE EX EMUL
1.0000 "application " | Freq: Two times a day (BID) | CUTANEOUS | Status: DC
  Administered 2020-04-08: 1 via TOPICAL

## 2020-04-09 ENCOUNTER — Ambulatory Visit
Admission: RE | Admit: 2020-04-09 | Discharge: 2020-04-09 | Disposition: A | Discharge status: Home / Self Care | Payer: Medicaid Other | Source: Ambulatory Visit | Attending: Radiation Oncology | Admitting: Radiation Oncology

## 2020-04-09 ENCOUNTER — Telehealth: Payer: Self-pay | Admitting: Hematology and Oncology

## 2020-04-09 DIAGNOSIS — Z510 Encounter for antineoplastic radiation therapy: Secondary | ICD-10-CM | POA: Diagnosis not present

## 2020-04-09 NOTE — Telephone Encounter (Signed)
Scheduled appt per 10/12 sch msg - mailed reminder letter with appt date and time

## 2020-04-10 ENCOUNTER — Ambulatory Visit
Admission: RE | Admit: 2020-04-10 | Discharge: 2020-04-10 | Disposition: A | Discharge status: Home / Self Care | Payer: Medicaid Other | Source: Ambulatory Visit | Attending: Radiation Oncology | Admitting: Radiation Oncology

## 2020-04-10 DIAGNOSIS — Z510 Encounter for antineoplastic radiation therapy: Secondary | ICD-10-CM | POA: Diagnosis not present

## 2020-04-11 ENCOUNTER — Ambulatory Visit
Admission: RE | Admit: 2020-04-11 | Discharge: 2020-04-11 | Disposition: A | Discharge status: Home / Self Care | Payer: Medicaid Other | Source: Ambulatory Visit | Attending: Radiation Oncology | Admitting: Radiation Oncology

## 2020-04-11 DIAGNOSIS — Z510 Encounter for antineoplastic radiation therapy: Secondary | ICD-10-CM | POA: Diagnosis not present

## 2020-04-14 ENCOUNTER — Ambulatory Visit
Admission: RE | Admit: 2020-04-14 | Discharge: 2020-04-14 | Disposition: A | Discharge status: Home / Self Care | Payer: Medicaid Other | Source: Ambulatory Visit | Attending: Radiation Oncology | Admitting: Radiation Oncology

## 2020-04-14 DIAGNOSIS — Z510 Encounter for antineoplastic radiation therapy: Secondary | ICD-10-CM | POA: Diagnosis not present

## 2020-04-15 ENCOUNTER — Ambulatory Visit
Admission: RE | Admit: 2020-04-15 | Discharge: 2020-04-15 | Disposition: A | Discharge status: Home / Self Care | Payer: Medicaid Other | Source: Ambulatory Visit | Attending: Radiation Oncology | Admitting: Radiation Oncology

## 2020-04-15 ENCOUNTER — Ambulatory Visit: Admission: RE | Admit: 2020-04-15 | Payer: Medicaid Other | Source: Ambulatory Visit | Admitting: Radiation Oncology

## 2020-04-15 DIAGNOSIS — C50811 Malignant neoplasm of overlapping sites of right female breast: Secondary | ICD-10-CM

## 2020-04-15 DIAGNOSIS — Z510 Encounter for antineoplastic radiation therapy: Secondary | ICD-10-CM | POA: Diagnosis not present

## 2020-04-15 DIAGNOSIS — Z170 Estrogen receptor positive status [ER+]: Secondary | ICD-10-CM

## 2020-04-16 ENCOUNTER — Ambulatory Visit
Admission: RE | Admit: 2020-04-16 | Discharge: 2020-04-16 | Disposition: A | Discharge status: Home / Self Care | Payer: Medicaid Other | Source: Ambulatory Visit | Attending: Radiation Oncology | Admitting: Radiation Oncology

## 2020-04-16 DIAGNOSIS — Z510 Encounter for antineoplastic radiation therapy: Secondary | ICD-10-CM | POA: Diagnosis not present

## 2020-04-17 ENCOUNTER — Ambulatory Visit
Admission: RE | Admit: 2020-04-17 | Discharge: 2020-04-17 | Disposition: A | Discharge status: Home / Self Care | Payer: Medicaid Other | Source: Ambulatory Visit | Attending: Radiation Oncology | Admitting: Radiation Oncology

## 2020-04-17 DIAGNOSIS — Z510 Encounter for antineoplastic radiation therapy: Secondary | ICD-10-CM | POA: Diagnosis not present

## 2020-04-18 ENCOUNTER — Ambulatory Visit
Admission: RE | Admit: 2020-04-18 | Discharge: 2020-04-18 | Disposition: A | Discharge status: Home / Self Care | Payer: Medicaid Other | Source: Ambulatory Visit | Attending: Radiation Oncology | Admitting: Radiation Oncology

## 2020-04-18 DIAGNOSIS — Z510 Encounter for antineoplastic radiation therapy: Secondary | ICD-10-CM | POA: Diagnosis not present

## 2020-04-21 ENCOUNTER — Ambulatory Visit
Admission: RE | Admit: 2020-04-21 | Discharge: 2020-04-21 | Disposition: A | Discharge status: Home / Self Care | Payer: Medicaid Other | Source: Ambulatory Visit | Attending: Radiation Oncology | Admitting: Radiation Oncology

## 2020-04-21 ENCOUNTER — Ambulatory Visit: Payer: Self-pay

## 2020-04-21 DIAGNOSIS — Z510 Encounter for antineoplastic radiation therapy: Secondary | ICD-10-CM | POA: Diagnosis not present

## 2020-04-22 ENCOUNTER — Telehealth: Payer: Self-pay | Admitting: Licensed Clinical Social Worker

## 2020-04-22 ENCOUNTER — Ambulatory Visit: Payer: Medicaid Other | Admitting: Radiation Oncology

## 2020-04-22 ENCOUNTER — Ambulatory Visit
Admission: RE | Admit: 2020-04-22 | Discharge: 2020-04-22 | Disposition: A | Discharge status: Home / Self Care | Payer: Medicaid Other | Source: Ambulatory Visit | Attending: Radiation Oncology | Admitting: Radiation Oncology

## 2020-04-22 DIAGNOSIS — Z510 Encounter for antineoplastic radiation therapy: Secondary | ICD-10-CM | POA: Diagnosis not present

## 2020-04-22 NOTE — Telephone Encounter (Signed)
[place] Work  CSW received call from patient inquiring if she has any assistance funds left as her apartment has mold and she is going to be moving. Patient has utilized Therapist, occupational, [employer], [employer], [employer], and [employer]. CSW offered information on [place], but this is not financial assistance for moving.  CSW will contact patient if any programs become known.  Gina Areola [name], LCSW

## 2020-04-23 ENCOUNTER — Ambulatory Visit
Admission: RE | Admit: 2020-04-23 | Discharge: 2020-04-23 | Disposition: A | Discharge status: Home / Self Care | Payer: Medicaid Other | Source: Ambulatory Visit | Attending: Radiation Oncology | Admitting: Radiation Oncology

## 2020-04-23 DIAGNOSIS — Z510 Encounter for antineoplastic radiation therapy: Secondary | ICD-10-CM | POA: Diagnosis not present

## 2020-04-24 ENCOUNTER — Ambulatory Visit
Admission: RE | Admit: 2020-04-24 | Discharge: 2020-04-24 | Disposition: A | Discharge status: Home / Self Care | Payer: Medicaid Other | Source: Ambulatory Visit | Attending: Radiation Oncology | Admitting: Radiation Oncology

## 2020-04-24 DIAGNOSIS — Z510 Encounter for antineoplastic radiation therapy: Secondary | ICD-10-CM | POA: Diagnosis not present

## 2020-04-25 ENCOUNTER — Ambulatory Visit
Admission: RE | Admit: 2020-04-25 | Discharge: 2020-04-25 | Disposition: A | Discharge status: Home / Self Care | Payer: Medicaid Other | Source: Ambulatory Visit | Attending: Radiation Oncology | Admitting: Radiation Oncology

## 2020-04-25 DIAGNOSIS — Z510 Encounter for antineoplastic radiation therapy: Secondary | ICD-10-CM | POA: Diagnosis not present

## 2020-04-28 ENCOUNTER — Ambulatory Visit
Admission: RE | Admit: 2020-04-28 | Discharge: 2020-04-28 | Disposition: A | Discharge status: Home / Self Care | Payer: Medicaid Other | Source: Ambulatory Visit | Attending: Radiation Oncology | Admitting: Radiation Oncology

## 2020-04-28 DIAGNOSIS — Z510 Encounter for antineoplastic radiation therapy: Secondary | ICD-10-CM | POA: Insufficient documentation

## 2020-04-28 DIAGNOSIS — C773 Secondary and unspecified malignant neoplasm of axilla and upper limb lymph nodes: Secondary | ICD-10-CM | POA: Diagnosis not present

## 2020-04-28 DIAGNOSIS — C50811 Malignant neoplasm of overlapping sites of right female breast: Secondary | ICD-10-CM | POA: Diagnosis present

## 2020-04-28 DIAGNOSIS — Z170 Estrogen receptor positive status [ER+]: Secondary | ICD-10-CM | POA: Insufficient documentation

## 2020-04-29 ENCOUNTER — Ambulatory Visit
Admission: RE | Admit: 2020-04-29 | Discharge: 2020-04-29 | Disposition: A | Discharge status: Home / Self Care | Payer: Medicaid Other | Source: Ambulatory Visit | Attending: Radiation Oncology | Admitting: Radiation Oncology

## 2020-04-29 ENCOUNTER — Other Ambulatory Visit: Payer: Self-pay

## 2020-04-29 DIAGNOSIS — Z510 Encounter for antineoplastic radiation therapy: Secondary | ICD-10-CM | POA: Diagnosis not present

## 2020-04-30 ENCOUNTER — Ambulatory Visit
Admission: RE | Admit: 2020-04-30 | Discharge: 2020-04-30 | Disposition: A | Discharge status: Home / Self Care | Payer: Medicaid Other | Source: Ambulatory Visit | Attending: Radiation Oncology | Admitting: Radiation Oncology

## 2020-04-30 DIAGNOSIS — Z510 Encounter for antineoplastic radiation therapy: Secondary | ICD-10-CM | POA: Diagnosis not present

## 2020-05-01 ENCOUNTER — Ambulatory Visit
Admission: RE | Admit: 2020-05-01 | Discharge: 2020-05-01 | Disposition: A | Discharge status: Home / Self Care | Payer: Medicaid Other | Source: Ambulatory Visit | Attending: Radiation Oncology | Admitting: Radiation Oncology

## 2020-05-01 DIAGNOSIS — Z510 Encounter for antineoplastic radiation therapy: Secondary | ICD-10-CM | POA: Diagnosis not present

## 2020-05-02 ENCOUNTER — Ambulatory Visit
Admission: RE | Admit: 2020-05-02 | Discharge: 2020-05-02 | Disposition: A | Discharge status: Home / Self Care | Payer: Medicaid Other | Source: Ambulatory Visit | Attending: Radiation Oncology | Admitting: Radiation Oncology

## 2020-05-02 DIAGNOSIS — Z510 Encounter for antineoplastic radiation therapy: Secondary | ICD-10-CM | POA: Diagnosis not present

## 2020-05-05 ENCOUNTER — Encounter: Payer: Self-pay | Admitting: Radiation Oncology

## 2020-05-05 ENCOUNTER — Other Ambulatory Visit: Payer: Self-pay

## 2020-05-05 ENCOUNTER — Encounter: Payer: Self-pay | Admitting: *Deleted

## 2020-05-05 ENCOUNTER — Inpatient Hospital Stay: Payer: Medicaid Other | Attending: Hematology and Oncology | Admitting: Hematology and Oncology

## 2020-05-05 ENCOUNTER — Telehealth: Payer: Self-pay | Admitting: Hematology and Oncology

## 2020-05-05 ENCOUNTER — Ambulatory Visit
Admission: RE | Admit: 2020-05-05 | Discharge: 2020-05-05 | Disposition: A | Discharge status: Home / Self Care | Payer: Medicaid Other | Source: Ambulatory Visit | Attending: Radiation Oncology | Admitting: Radiation Oncology

## 2020-05-05 ENCOUNTER — Ambulatory Visit: Payer: Medicaid Other

## 2020-05-05 DIAGNOSIS — Z9011 Acquired absence of right breast and nipple: Secondary | ICD-10-CM | POA: Insufficient documentation

## 2020-05-05 DIAGNOSIS — Z510 Encounter for antineoplastic radiation therapy: Secondary | ICD-10-CM | POA: Diagnosis not present

## 2020-05-05 DIAGNOSIS — C773 Secondary and unspecified malignant neoplasm of axilla and upper limb lymph nodes: Secondary | ICD-10-CM | POA: Diagnosis not present

## 2020-05-05 DIAGNOSIS — C50811 Malignant neoplasm of overlapping sites of right female breast: Secondary | ICD-10-CM | POA: Insufficient documentation

## 2020-05-05 DIAGNOSIS — Z9221 Personal history of antineoplastic chemotherapy: Secondary | ICD-10-CM | POA: Insufficient documentation

## 2020-05-05 DIAGNOSIS — Z170 Estrogen receptor positive status [ER+]: Secondary | ICD-10-CM | POA: Insufficient documentation

## 2020-05-05 DIAGNOSIS — Z923 Personal history of irradiation: Secondary | ICD-10-CM | POA: Diagnosis not present

## 2020-05-05 MED ORDER — TAMOXIFEN CITRATE 20 MG PO TABS
20.0000 mg | ORAL_TABLET | Freq: Every day | ORAL | 3 refills | Status: DC
Start: 2020-05-05 — End: 2020-06-30

## 2020-05-05 NOTE — Assessment & Plan Note (Signed)
08/17/19:Patient palpated a right breast mass and right axilla mass and noted skin redness x2 weeks. She went to the ED and had an abscess drained with no improvement. Diagnostic mammogram and US showed a 7.3cm right breast mass extending into all 4 quadrants and partially into overlying skin with 12 abnormal right axillary lymph nodes. 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  Treatment plan: 1. Neoadjuvant chemotherapy with Adriamycin and Cytoxan dose dense 4 followed byTaxolweekly 8stopped after cycle 8 for neuropathy 2. 01/24/2020: Right mastectomy: No residual cancer identified, 0/5 lymph nodes negative treatment effect in the breast and the lymph nodes.  ER 40% weak staining, PR negative, HER-2 negative, Ki-67 90%, complete pathologic response 3. Followed by adjuvant radiation therapy completed 05/05/2020 4.Follow-up adjuvant antiestrogen therapy  CT CAP 08/31/2019: Very large central right breast mass, bulky right axillary and subpectoral lymph nodes. No evidence of distant metastatic disease. Bulky uterine fibroids Bone scan 08/31/2019: No metastatic disease in the bones ---------------------------------------------------------------------------------------------------------------------------------------------------- Because the tumor is weakly positive in the ER 40% weak staining, I still recommended adjuvant antiestrogen therapy.  We discussed the risks and benefits of anti-estrogen therapy with aromatase inhibitors. These include but not limited to insomnia, hot flashes, mood changes, vaginal dryness, bone density loss, and weight gain. We strongly believe that the benefits far outweigh the risks. Patient understands these risks and consented to starting treatment. Planned treatment duration is 7 years.  Return to clinic in 3 months for survivorship care plan visit

## 2020-05-05 NOTE — Telephone Encounter (Signed)
Scheduled appointment per 11/5 los. Spoke to patient who is aware of appointment date and time.  

## 2020-05-05 NOTE — Progress Notes (Signed)
Patient Care Team: Patient, [name] as PCP - General (General Practice) [name], RN as Oncology Nurse Navigator [name], RN as Oncology Nurse Navigator [name], MD as Consulting Physician (General Surgery) [name], MD as Consulting Physician (Hematology and Oncology) [name], MD as Consulting Physician (Radiation Oncology)  DIAGNOSIS:    ICD-10-CM   1. Malignant neoplasm of overlapping sites of right breast in female, estrogen receptor positive ([place])  C50.811    Z17.0     SUMMARY OF ONCOLOGIC HISTORY: Oncology History  Malignant neoplasm of overlapping sites of right breast in female, estrogen receptor positive ([place])  08/17/2019 Initial Diagnosis   Patient palpated a right breast mass and right axilla mass and noted skin redness x2 weeks. She went to the ED and had an abscess drained with no improvement. Diagnostic mammogram and US showed a 7.3cm right breast mass extending into all 4 quadrants and partially into overlying skin with 12 abnormal right axillary lymph nodes. Biopsy  showed IDC with necrosis in the breast and axilla, grade 3, HER-2 equivocal by IHC, negative by FISH, ER+ 40% weak, PR -, Ki67 90%.    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 The report date is 09/03/2019.  The Common Hereditary Cancers Panel offered by [facility] includes sequencing and/or deletion duplication testing of the following 48 genes: APC, ATM, AXIN2, BARD1, BMPR1A, BRCA1, BRCA2, BRIP1, CDH1, CDK4, CDKN2A (p14ARF), CDKN2A (p16INK4a), CHEK2, CTNNA1, DICER1, EPCAM (Deletion/duplication testing only), GREM1 (promoter region deletion/duplication testing only), KIT, MEN1, MLH1, MSH2, MSH3, MSH6, MUTYH, NBN, NF1, NHTL1, PALB2, PDGFRA, PMS2, POLD1, POLE, PTEN, RAD50, RAD51C, RAD51D, RNF43, SDHB, SDHC, SDHD, SMAD4, SMARCA4. STK11, TP53,  TSC1, TSC2, and VHL.  The following genes were evaluated for sequence changes only: SDHA and HOXB13 c.251G>A variant only.    09/04/2019 - 12/18/2019 Neo-Adjuvant Chemotherapy   Dose since Adriamycin and Cytoxan followed by Taxol x6 stopped for neuropathy   01/24/2020 Surgery   Right mastectomy [name]): no residual carcinoma, 5 lymph nodes negative for carcinoma.   03/25/2020 - 05/05/2020 Radiation Therapy   Adjuvant radiation     CHIEF COMPLIANT: Follow-up to discuss antiestrogen therapy  INTERVAL HISTORY: Gina Ross is a 49 y.o. with above-mentioned history of right breast cancerwho completed neoadjuvant chemotherapy, underwent a right mastectomy, and is completed radiation therapy on 05/05/20. She presents to the clinic today to discuss antiestrogen therapy.   ALLERGIES:  is allergic to penicillins.  MEDICATIONS:  Current Outpatient Medications  Medication Sig Dispense Refill  . acetaminophen (TYLENOL) 325 MG tablet Take 650 mg by mouth every 6 (six) hours as needed for moderate pain or headache.     . cetirizine (ZYRTEC) 10 MG tablet Take 10 mg by mouth daily as needed for allergies.    Marland Kitchen gabapentin (NEURONTIN) 100 MG capsule Take 100-300 mg by mouth See admin instructions. Take 100 mg at bedtime, may increase up to 300 mg at bedtime as needed for pain    . gabapentin (NEURONTIN) 100 MG capsule Take 1 capsule (100 mg total) by mouth at bedtime. 30 capsule 1  . ibuprofen (ADVIL) 800 MG tablet Take 800 mg by mouth every 8 (eight) hours as needed for moderate pain.    Marland Kitchen lidocaine-prilocaine (EMLA) cream Apply to affected area once (Patient taking differently: Apply 1 application topically daily as needed (port access). ) 30 g 3  .  LORazepam (ATIVAN) 0.5 MG tablet Take 1 tablet (0.5 mg total) by mouth at bedtime as needed (Nausea or vomiting). 30 tablet 1  . methocarbamol (ROBAXIN) 500 MG tablet Take 1 tablet (500 mg total) by mouth 3 (three) times daily. 30 tablet 1  . ondansetron  (ZOFRAN) 8 MG tablet Take 1 tablet (8 mg total) by mouth 2 (two) times daily as needed. Start on the third day after chemotherapy. 30 tablet 1  . oxyCODONE (OXY IR/ROXICODONE) 5 MG immediate release tablet Take 1 tablet (5 mg total) by mouth every 4 (four) hours as needed for moderate pain. 12 tablet 0  . prochlorperazine (COMPAZINE) 10 MG tablet Take 1 tablet (10 mg total) by mouth every 6 (six) hours as needed (Nausea or vomiting). 30 tablet 1  . traMADol (ULTRAM) 50 MG tablet Take 1 tablet (50 mg total) by mouth every 6 (six) hours as needed. 12 tablet 0   No current facility-administered medications for this visit.    PHYSICAL EXAMINATION: ECOG PERFORMANCE STATUS: 1 - Symptomatic but completely ambulatory  Vitals:   05/05/20 1047  BP: (!) 139/100  Pulse: 90  Resp: 18  Temp: 98.1 F (36.7 C)  SpO2: 91%   Filed Weights   05/05/20 1047  Weight: 157 lb 3.2 oz (71.3 kg)    LABORATORY DATA:  I have reviewed the data as listed CMP Latest Ref Rng & Units 01/17/2020 12/18/2019 12/11/2019  Glucose 70 - 99 mg/dL 94 90 101(H)  BUN 6 - 20 mg/dL 11 13 10   Creatinine 0.44 - 1.00 mg/dL 0.98 0.88 0.82  Sodium 135 - 145 mmol/L 141 140 140  Potassium 3.5 - 5.1 mmol/L 3.8 3.8 4.2  Chloride 98 - 111 mmol/L 107 107 108  CO2 22 - 32 mmol/L 24 24 24   Calcium 8.9 - 10.3 mg/dL 9.3 9.2 9.1  Total Protein 6.5 - 8.1 g/dL 7.0 7.2 6.6  Total Bilirubin 0.3 - 1.2 mg/dL 0.6 0.3 0.3  Alkaline Phos 38 - 126 U/L 58 65 59  AST 15 - 41 U/L 37 31 45(H)  ALT 0 - 44 U/L 52(H) 33 50(H)    Lab Results  Component Value Date   WBC 3.4 (L) 01/17/2020   HGB 13.1 01/17/2020   HCT 39.3 01/17/2020   MCV 93.6 01/17/2020   PLT 254 01/17/2020   NEUTROABS 1.0 (L) 12/18/2019    ASSESSMENT & PLAN:  Malignant neoplasm of overlapping sites of right breast in female, estrogen receptor positive ([place]) 08/17/19:Patient palpated a right breast mass and right axilla mass and noted skin redness x2 weeks. She went to the ED  and had an abscess drained with no improvement. Diagnostic mammogram and US showed a 7.3cm right breast mass extending into all 4 quadrants and partially into overlying skin with 12 abnormal right axillary lymph nodes. 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  Treatment plan: 1. Neoadjuvant chemotherapy with Adriamycin and Cytoxan dose dense 4 followed byTaxolweekly 8stopped after cycle 8 for neuropathy 2. 01/24/2020: Right mastectomy: No residual cancer identified, 0/5 lymph nodes negative treatment effect in the breast and the lymph nodes.  ER 40% weak staining, PR negative, HER-2 negative, Ki-67 90%, complete pathologic response 3. Followed by adjuvant radiation therapy completed 05/05/2020 4.Follow-up adjuvant antiestrogen therapy  CT CAP 08/31/2019: Very large central right breast mass, bulky right axillary and subpectoral lymph nodes. No evidence of distant metastatic disease.  Bulky uterine fibroids Bone scan 08/31/2019: No metastatic disease in the bones ---------------------------------------------------------------------------------------------------------------------------------------------------- Because the tumor is weakly positive in the ER 40% weak staining, I still recommended adjuvant antiestrogen therapy.  Tamoxifen counseling: We discussed the risks and benefits of tamoxifen. These include but not limited to insomnia, hot flashes, mood changes, vaginal dryness, and weight gain. Although rare, serious side effects including endometrial cancer, risk of blood clots were also discussed. We strongly believe that the benefits far outweigh the risks. Patient understands these risks and consented to starting treatment. Planned treatment duration is 5-10 years. She will start tamoxifen June 29, 2019. She will start at half a tablet daily for a month and then increase it to 20 mg daily  dose after that.  Return to clinic in 4 months for survivorship care plan visit    No orders of the defined types were placed in this encounter.  The patient has a good understanding of the overall plan. she agrees with it. she will call with any problems that may develop before the next visit here.  Total time spent: 30 mins including face to face time and time spent for planning, charting and coordination of care  [name], MD 05/05/2020  I, Cloyde Reams Dorshimer, am acting as scribe for Dr. [name].  I have reviewed the above documentation for accuracy and completeness, and I agree with the above.

## 2020-05-13 ENCOUNTER — Other Ambulatory Visit: Payer: Medicaid Other

## 2020-05-14 ENCOUNTER — Telehealth: Payer: Self-pay

## 2020-05-14 NOTE — Telephone Encounter (Signed)
Patient called as a scab had fallen off of her right breast and was draining clear serous fluid on her white t-shirt. She was asking if she should be concerned. Pt. Advised no to use gauze or Telfa to the area that is draining. She can use her sonafine cream to the rest of her breast but pt. Concerned putting it on the pink skin from under the scab might make the drainage worse. Patient advised will ask Dr. Sondra Come and will call her back.

## 2020-05-15 ENCOUNTER — Telehealth: Payer: Self-pay | Admitting: *Deleted

## 2020-05-15 ENCOUNTER — Ambulatory Visit
Admission: RE | Admit: 2020-05-15 | Discharge: 2020-05-15 | Disposition: A | Discharge status: Home / Self Care | Payer: Medicaid Other | Source: Ambulatory Visit | Attending: Radiation Oncology | Admitting: Radiation Oncology

## 2020-05-15 ENCOUNTER — Telehealth: Payer: Self-pay

## 2020-05-15 ENCOUNTER — Encounter: Payer: Self-pay | Admitting: Radiation Oncology

## 2020-05-15 ENCOUNTER — Other Ambulatory Visit: Payer: Self-pay

## 2020-05-15 VITALS — BP 120/93 | HR 93 | Temp 98.2°F | Resp 20 | Ht 67.0 in | Wt 156.0 lb

## 2020-05-15 DIAGNOSIS — Z79899 Other long term (current) drug therapy: Secondary | ICD-10-CM | POA: Insufficient documentation

## 2020-05-15 DIAGNOSIS — Z923 Personal history of irradiation: Secondary | ICD-10-CM | POA: Insufficient documentation

## 2020-05-15 DIAGNOSIS — C50811 Malignant neoplasm of overlapping sites of right female breast: Secondary | ICD-10-CM | POA: Diagnosis not present

## 2020-05-15 DIAGNOSIS — Z170 Estrogen receptor positive status [ER+]: Secondary | ICD-10-CM

## 2020-05-15 NOTE — Progress Notes (Signed)
Radiation Oncology         [phone] ________________________________  Name: Gina Ross MRN: [id number]  Date: 05/15/2020  DOB: [birth year]  Follow-Up Visit Note  CC: Patient, [name]  [name], MD    ICD-10-CM   1. Malignant neoplasm of overlapping sites of right breast in female, estrogen receptor positive ([place])  C50.811    Z17.0     Diagnosis:   Stage IIIC (ypT0, ypN0) Right Breast Invasive Ductal Carcinoma, ER+ / PR- / Her2-, Grade 3   Interval Since Last Radiation:  10 days  Narrative:  The patient returns today for unscheduled follow-up.  Patient noticed some skin breakdown and drainage from the right chest wall area.  She denies any chills or fever.  She has not had a significant amount of itching                              ALLERGIES:  is allergic to penicillins.  Meds: Current Outpatient Medications  Medication Sig Dispense Refill  . acetaminophen (TYLENOL) 325 MG tablet Take 650 mg by mouth every 6 (six) hours as needed for moderate pain or headache.     . cetirizine (ZYRTEC) 10 MG tablet Take 10 mg by mouth daily as needed for allergies.    Marland Kitchen gabapentin (NEURONTIN) 100 MG capsule Take 100-300 mg by mouth See admin instructions. Take 100 mg at bedtime, may increase up to 300 mg at bedtime as needed for pain    . gabapentin (NEURONTIN) 100 MG capsule Take 1 capsule (100 mg total) by mouth at bedtime. 30 capsule 1  . ibuprofen (ADVIL) 800 MG tablet Take 800 mg by mouth every 8 (eight) hours as needed for moderate pain.    Marland Kitchen lidocaine-prilocaine (EMLA) cream Apply to affected area once (Patient taking differently: Apply 1 application topically daily as needed (port access). ) 30 g 3  . LORazepam (ATIVAN) 0.5 MG tablet Take 1 tablet (0.5 mg total) by mouth at bedtime as needed (Nausea or vomiting). 30 tablet 1  . methocarbamol (ROBAXIN) 500 MG tablet Take 1 tablet (500 mg total) by mouth 3 (three) times daily. 30 tablet 1  . ondansetron (ZOFRAN) 8 MG  tablet Take 1 tablet (8 mg total) by mouth 2 (two) times daily as needed. Start on the third day after chemotherapy. 30 tablet 1  . oxyCODONE (OXY IR/ROXICODONE) 5 MG immediate release tablet Take 1 tablet (5 mg total) by mouth every 4 (four) hours as needed for moderate pain. 12 tablet 0  . prochlorperazine (COMPAZINE) 10 MG tablet Take 1 tablet (10 mg total) by mouth every 6 (six) hours as needed (Nausea or vomiting). 30 tablet 1  . tamoxifen (NOLVADEX) 20 MG tablet Take 1 tablet (20 mg total) by mouth daily. 90 tablet 3  . traMADol (ULTRAM) 50 MG tablet Take 1 tablet (50 mg total) by mouth every 6 (six) hours as needed. 12 tablet 0   No current facility-administered medications for this encounter.    Physical Findings: The patient is in no acute distress. Patient is alert and oriented.  height is _0  (1.702 m) and weight is 156 lb (70.8 kg). Her temperature is 98.2 F (36.8 C). Her blood pressure is 120/93 (abnormal) and her pulse is 93. Her respiration is 20 and oxygen saturation is 100%. .  The lungs are clear.  The heart has a regular rhythm and rate.  The right chest wall area  shows skin breakdown along the region of the mastectomy scar.  No signs of infection.  Open area is somewhat moist.  Lab Findings: Lab Results  Component Value Date   WBC 3.4 (L) 01/17/2020   HGB 13.1 01/17/2020   HCT 39.3 01/17/2020   MCV 93.6 01/17/2020   PLT 254 01/17/2020    Radiographic Findings: No results found.  Impression:  The patient is recovering from the effects of radiation.  Patient is experienced skin breakdown in the treatment area.  She was given Silvadene to place on the area of skin breakdown.  She will continue using sonofine elsewhere along the chest wall area.  She was also given samples of the triple antibiotic ointment to use.  Patient had application of Silvadene on the clinic and nonadherent dressings placed as well as pad and paper tape  Plan: Patient will return in 1 week for  close follow-up concerning her skin breakdown.  ____________________________________ [name], MD

## 2020-05-15 NOTE — Progress Notes (Signed)
Patient in for follow up for skin peeling. She is a the point in healing that she is peeling. Has serous drainage from the area. Pain is a 5/10 she does not have a fever and the area does not appear infected.

## 2020-05-15 NOTE — Telephone Encounter (Signed)
CALLED PATIENT TO INFORM OF FU ON 05-19-20 @ 10:45 AM, SPOKE WITH PATIENT AND SHE IS AWARE OF THIS APPT.

## 2020-05-15 NOTE — Telephone Encounter (Signed)
1041 am Patient called yesterday and today asking about what to do with skin that was coming off of her right breast and under her right arm. She reports the scab came off of her incision and the other areas that are peeling are draining clear fluid. Patient advised to  Use gauze or non adherent gauzed to protect her clothes as she was concerned about that. Advised to continue to use her cream in small amounts to prevent too much moisture that is draining. Patient also urged to use a fan and take her bra and shirt off to allow for air circulation to the moist areas. Patient reports she knows this is normal but is unsure about the drainage. Patient given an appointment at 4 pm today with Dr. Sondra Come for a f/u.

## 2020-05-18 NOTE — Progress Notes (Incomplete)
  Patient Name: Gina Ross MRN: [id number] DOB: December 10, 1970 Referring Physician: [name] (Profile Not Attached) Date of Service: 05/05/2020 [facility], [place]                                                        End Of Treatment Note  Diagnoses: C50.811-Malignant neoplasm of overlapping sites of right female breast  Cancer Staging: Stage IIIC (ypT0, ypN0) Right Breast Invasive Ductal Carcinoma, ER+ / PR- / Her2-, Grade 3  Intent: Curative  Radiation Treatment Dates: 03/25/2020 through 05/05/2020 Site Technique Total Dose (Gy) Dose per Fx (Gy) Completed Fx Beam Energies  Chest Wall, Right: CW_Rt 3D 50/50 2 25/25 6X  Chest Wall, Right: CW_Rt_Bst Electron 10/10 2 5/5 6E  Chest Wall, Right: CW_Rt_SCV 3D 50/50 2 25/25 6X, 10X   Narrative: The patient tolerated radiation therapy relatively well. She did report ongoing fatigue, intermittent burning to the right upper chest, bilateral shoulder discomfort, and ongoing numbness under her right arm. Throughout treatment, the patient was noted to have some mild hyperpigmentation changes along the right chest wall area without skin breakdown.  Plan: The patient will follow-up with radiation oncology in one month.  ________________________________________________   Blair Promise, PhD, MD  This document serves as a record of services personally performed by [name], MD. It was created on his behalf by Clerance Lav, a trained medical scribe. The creation of this record is based on the scribe's personal observations and the provider's statements to them. This document has been checked and approved by the attending provider.

## 2020-05-19 ENCOUNTER — Ambulatory Visit
Admission: RE | Admit: 2020-05-19 | Discharge: 2020-05-19 | Disposition: A | Discharge status: Home / Self Care | Payer: Medicaid Other | Source: Ambulatory Visit | Attending: Radiation Oncology | Admitting: Radiation Oncology

## 2020-05-19 ENCOUNTER — Telehealth: Payer: Self-pay | Admitting: Radiation Oncology

## 2020-05-19 NOTE — Telephone Encounter (Signed)
Gina Ross called saying that she had missed an appt w/Dr. Sondra Come. I told her that she was r/s to see Dr. Sondra Come on 12/9 @ 10am.  She also asked if she could get a TB test. Before I could transfer her to the nurse, call was gone. I am forwarding this to Barbra Sarks, RN.

## 2020-06-04 NOTE — Progress Notes (Addendum)
Gina Ross presents today for a skin check-up in treatment field after completing radiation to her right breast on 05/05/2020  Fatigue: Endorses a normal energy and activity level Pain:Denies pain. Reports bilateral intermittent upper arm soreness. No evidence of lymphedema noted. Skin: Right chest wall healing well without redness, drainage or edema. Dry desquamation noted. Patient endorses continued use of radiaplex and sonafine. ROM: Full ROM of both upper extremities Lymphedema: No evidence of lymphedema. MedOnc: Scheduled for Survivorship Care Plan with [name] on 09/02/2020 Other issues of note: Denies headache, dizziness, nausea, vomiting or diarrhea. Very concerned about right chest wall getting an infection. Attempted to reassure her that it is very rare that skin breakdown from radiation becomes infected. Encouraged her to continue current regimen of care.   Patient reports that she has tamoxifen at home but was instructed not to begin taking it until January.   BP (!) 116/92 (BP Location: Left Arm, Patient Position: Sitting, Cuff Size: Normal)   Pulse 90   Temp 97.8 F (36.6 C)   Resp 18   Ht 5\' 9"  (1.753 m)   Wt 157 lb 9.6 oz (71.5 kg)   SpO2 100%   BMI 23.27 kg/m  Wt Readings from Last 3 Encounters:  06/05/20 157 lb 9.6 oz (71.5 kg)  05/15/20 156 lb (70.8 kg)  05/05/20 157 lb 3.2 oz (71.3 kg)

## 2020-06-05 ENCOUNTER — Other Ambulatory Visit: Payer: Self-pay

## 2020-06-05 ENCOUNTER — Encounter: Payer: Self-pay | Admitting: Radiation Oncology

## 2020-06-05 ENCOUNTER — Ambulatory Visit
Admission: RE | Admit: 2020-06-05 | Discharge: 2020-06-05 | Disposition: A | Discharge status: Home / Self Care | Payer: Medicaid Other | Source: Ambulatory Visit | Attending: Radiation Oncology | Admitting: Radiation Oncology

## 2020-06-05 VITALS — BP 116/92 | HR 90 | Temp 97.8°F | Resp 18 | Ht 69.0 in | Wt 157.6 lb

## 2020-06-05 DIAGNOSIS — Z923 Personal history of irradiation: Secondary | ICD-10-CM | POA: Diagnosis not present

## 2020-06-05 DIAGNOSIS — Z79899 Other long term (current) drug therapy: Secondary | ICD-10-CM | POA: Insufficient documentation

## 2020-06-05 DIAGNOSIS — C50811 Malignant neoplasm of overlapping sites of right female breast: Secondary | ICD-10-CM | POA: Diagnosis not present

## 2020-06-05 DIAGNOSIS — Z1509 Genetic susceptibility to other malignant neoplasm: Secondary | ICD-10-CM

## 2020-06-05 DIAGNOSIS — Z1501 Genetic susceptibility to malignant neoplasm of breast: Secondary | ICD-10-CM

## 2020-06-05 DIAGNOSIS — Z791 Long term (current) use of non-steroidal anti-inflammatories (NSAID): Secondary | ICD-10-CM | POA: Insufficient documentation

## 2020-06-05 DIAGNOSIS — Z170 Estrogen receptor positive status [ER+]: Secondary | ICD-10-CM | POA: Insufficient documentation

## 2020-06-05 HISTORY — DX: Malignant neoplasm of unspecified site of unspecified female breast: C50.919

## 2020-06-05 NOTE — Progress Notes (Signed)
Radiation Oncology         [phone] ________________________________  Name: Gina Ross MRN: [id number]  Date: 06/05/2020  DOB: 11/24/1970  Follow-Up Visit Note  CC: Patient, [name]  [name], MD    ICD-10-CM   1. Malignant neoplasm of overlapping sites of right breast in female, estrogen receptor positive ([place])  C50.811    Z17.0   2. BRCA1 gene mutation positive in female  Z15.01    Z15.02    Z15.09     Diagnosis: Stage IIIC (ypT0, ypN0) Right Breast Invasive Ductal Carcinoma, ER+ / PR- / Her2-, Grade 3  Interval Since Last Radiation: One month and one day  Radiation Treatment Dates: 03/25/2020 through 05/05/2020 Site Technique Total Dose (Gy) Dose per Fx (Gy) Completed Fx Beam Energies  Chest Wall, Right: CW_Rt 3D 50/50 2 25/25 6X  Chest Wall, Right: CW_Rt_Bst Electron 10/10 2 5/5 6E  Chest Wall, Right: CW_Rt_SCV 3D 50/50 2 25/25 6X, 10X    Narrative:  The patient returns today for routine follow-up. She was seen on 05/15/2020 for an unscheduled follow-up regarding skin breakdown in the treatment area. At that time, she was given Silvadene and was advised to continue using Sonofine elsewhere along the chest wall area. She was also given samples of triple antibiotic ointment to use. No other significant interval history since the end of treatment.  On review of systems, she reports tightness along the right chest wall area and occasional pain. She denies swelling in her right arm or hand.  She denies any cough or breathing problems.  She denies any drainage from the right chest wall area..   ALLERGIES:  is allergic to penicillins.  Meds: Current Outpatient Medications  Medication Sig Dispense Refill  . acetaminophen (TYLENOL) 325 MG tablet Take 650 mg by mouth every 6 (six) hours as needed for moderate pain or headache.     . cetirizine (ZYRTEC) 10 MG tablet Take 10 mg by mouth daily as needed for allergies.    Marland Kitchen ibuprofen (ADVIL) 800 MG tablet Take 800 mg  by mouth every 8 (eight) hours as needed for moderate pain.    Marland Kitchen gabapentin (NEURONTIN) 100 MG capsule Take 100-300 mg by mouth See admin instructions. Take 100 mg at bedtime, may increase up to 300 mg at bedtime as needed for pain (Patient not taking: Reported on 06/05/2020)    . lidocaine-prilocaine (EMLA) cream Apply to affected area once (Patient not taking: Reported on 06/05/2020) 30 g 3  . LORazepam (ATIVAN) 0.5 MG tablet Take 1 tablet (0.5 mg total) by mouth at bedtime as needed (Nausea or vomiting). (Patient not taking: Reported on 06/05/2020) 30 tablet 1  . methocarbamol (ROBAXIN) 500 MG tablet Take 1 tablet (500 mg total) by mouth 3 (three) times daily. (Patient not taking: Reported on 06/05/2020) 30 tablet 1  . ondansetron (ZOFRAN) 8 MG tablet Take 1 tablet (8 mg total) by mouth 2 (two) times daily as needed. Start on the third day after chemotherapy. (Patient not taking: Reported on 06/05/2020) 30 tablet 1  . oxyCODONE (OXY IR/ROXICODONE) 5 MG immediate release tablet Take 1 tablet (5 mg total) by mouth every 4 (four) hours as needed for moderate pain. (Patient not taking: Reported on 06/05/2020) 12 tablet 0  . prochlorperazine (COMPAZINE) 10 MG tablet Take 1 tablet (10 mg total) by mouth every 6 (six) hours as needed (Nausea or vomiting). (Patient not taking: Reported on 06/05/2020) 30 tablet 1  . tamoxifen (NOLVADEX) 20 MG tablet  Take 1 tablet (20 mg total) by mouth daily. (Patient not taking: Reported on 06/05/2020) 90 tablet 3  . traMADol (ULTRAM) 50 MG tablet Take 1 tablet (50 mg total) by mouth every 6 (six) hours as needed. (Patient not taking: Reported on 06/05/2020) 12 tablet 0   No current facility-administered medications for this encounter.    Physical Findings: The patient is in no acute distress. Patient is alert and oriented.  height is _0  (1.753 m) and weight is 157 lb 9.6 oz (71.5 kg). Her temperature is 97.8 F (36.6 C). Her blood pressure is 116/92 (abnormal) and her pulse  is 90. Her respiration is 18 and oxygen saturation is 100%. The lungs are clear. The heart has a regular rhythm and rate.  The right chest wall area shows hyperpigmentation changes and some mild dry desquamation.  There is no moist desquamation at this time or signs of infection.  Patient still has a slight skin separation that will require some additional healing.  Lab Findings: Lab Results  Component Value Date   WBC 3.4 (L) 01/17/2020   HGB 13.1 01/17/2020   HCT 39.3 01/17/2020   MCV 93.6 01/17/2020   PLT 254 01/17/2020    Radiographic Findings: No results found.  Impression: Stage IIIC (ypT0, ypN0) Right Breast Invasive Ductal Carcinoma, ER+ / PR- / Her2-, Grade 3  The patient is recovering from the effects of radiation.  Satisfactory improvement since last exam  Plan: The patient is scheduled to follow up with Wilber Bihari, PA-C, on 09/02/2020. She will follow up with radiation oncology in 1 months.  Patient was given a tube of radiaplex/sonophine to find a place on her skin    ____________________________________   Blair Promise, PhD, MD  This document serves as a record of services personally performed by [name], MD. It was created on his behalf by Clerance Lav, a trained medical scribe. The creation of this record is based on the scribe's personal observations and the provider's statements to them. This document has been checked and approved by the attending provider.

## 2020-06-18 ENCOUNTER — Other Ambulatory Visit: Payer: Medicaid Other

## 2020-06-30 ENCOUNTER — Other Ambulatory Visit: Payer: Self-pay

## 2020-06-30 MED ORDER — TAMOXIFEN CITRATE 20 MG PO TABS: 20.0000 mg | ORAL_TABLET | Freq: Every day | ORAL | 3 refills | Status: DC

## 2020-07-08 ENCOUNTER — Telehealth: Payer: Self-pay | Admitting: Adult Health

## 2020-07-08 NOTE — Telephone Encounter (Signed)
Scheduled appt per 1/11 sch msg - pt is aware of appt.

## 2020-07-09 IMAGING — MR MR BREAST BILAT WO/W CM
7 of 10 series · 28 of 48 positions shown · IV contrast (gadavist)
Comparison: Previous exam(s).

CLINICAL DATA: 48-year-old female with newly diagnosed right breast
cancer and metastatic axillary lymphadenopathy.

LABS:  None performed on site.
EXAM:
BILATERAL BREAST MRI WITH AND WITHOUT CONTRAST
TECHNIQUE: Multiplanar, multisequence MR images of both breasts were obtained
prior to and following the intravenous administration of 6 ml of
Gadavist.

[Series 2: T2 · axial · 3.0mm · 0.83mm/px · 1 of 61 slices shown]
[im 1/61]
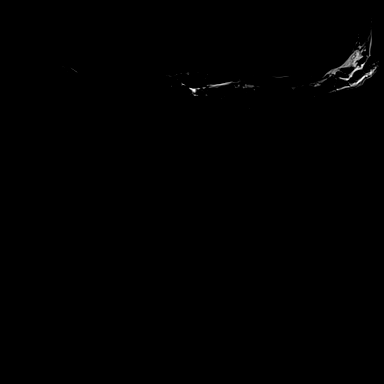

[Series 3: T1 fat-sat · axial · 1.2mm · 0.71mm/px · z∈[-68,+123]mm · 5 of 159 slices shown (1 of 4)]
[im 1/159]
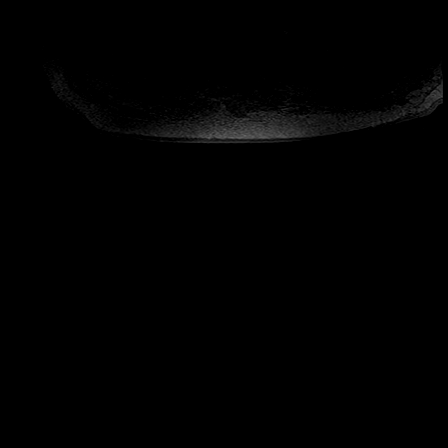
[im 40/159]
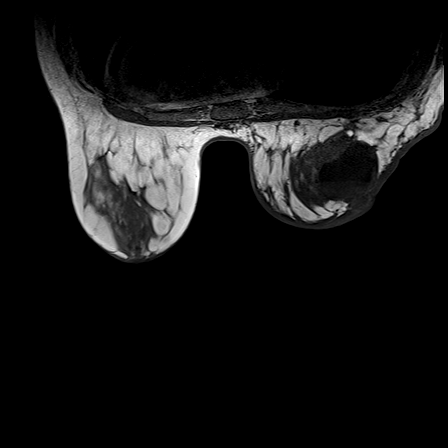
[im 80/159]
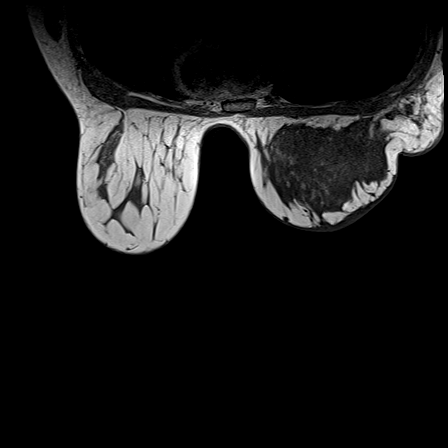
[im 119/159]
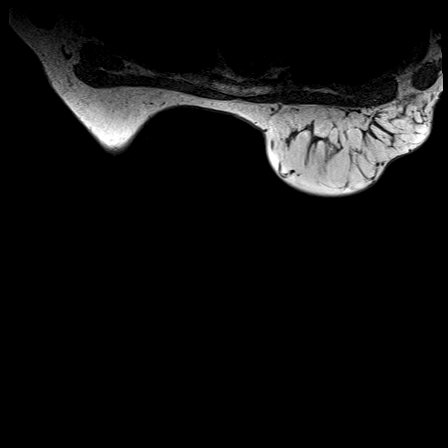
[im 159/159]
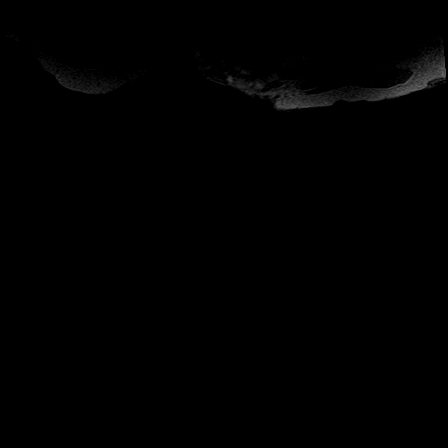

[Series 5: T1 fat-sat · axial · 1.2mm · 0.77mm/px · z∈[-68,+123]mm · 5 of 160 slices shown (2 of 4)]
[im 1/160]
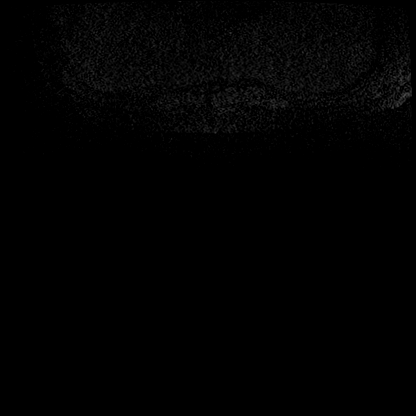
[im 40/160]
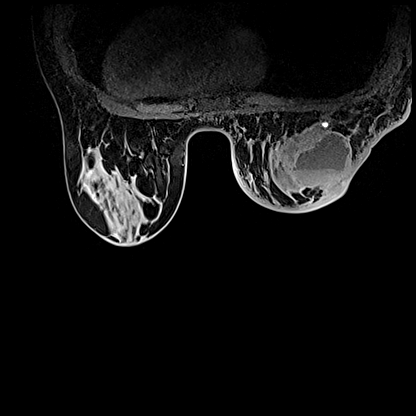
[im 80/160]
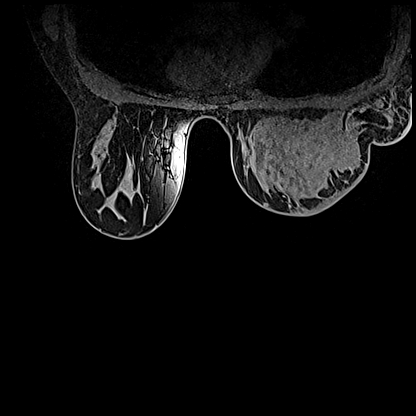
[im 120/160]
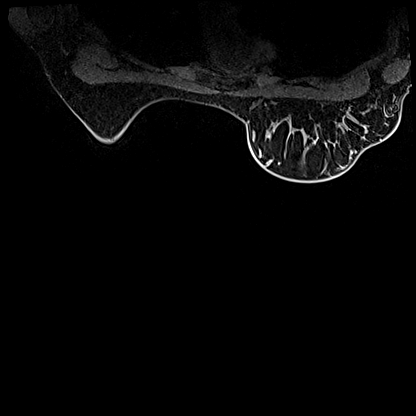
[im 160/160]
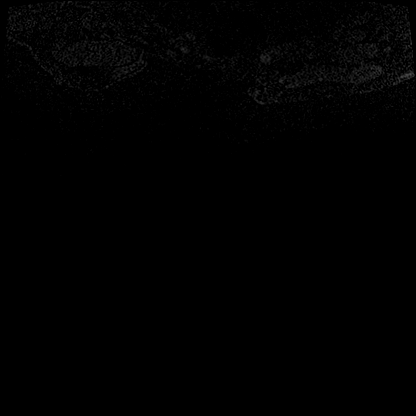

[Series 6: T1 fat-sat · axial · 1.2mm · 0.77mm/px · z∈[-68,+123]mm · 6 of 160 slices shown (3 of 4)]
[im 1/160]
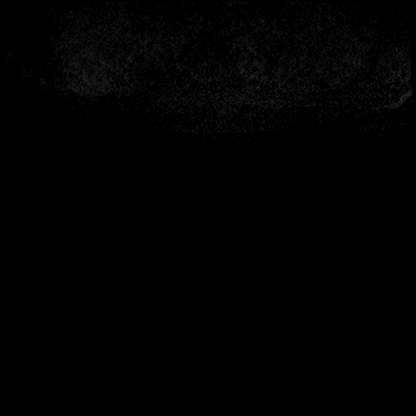
[im 32/160]
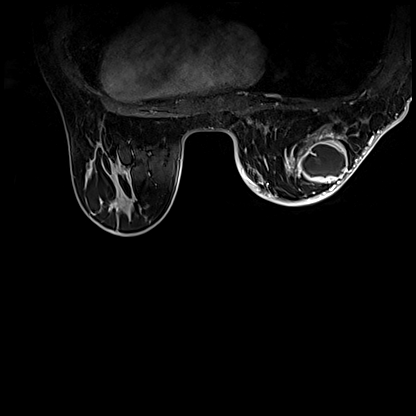
[im 64/160]
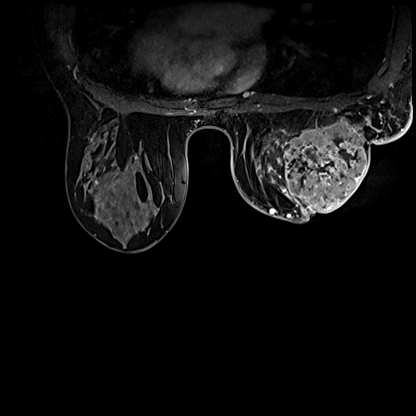
[im 96/160]
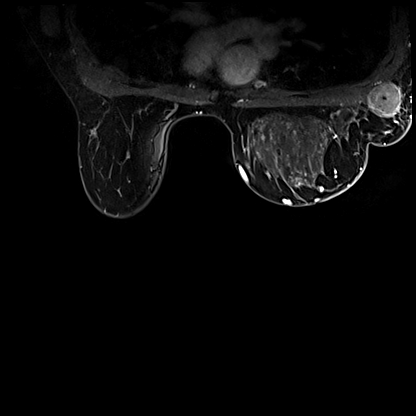
[im 128/160]
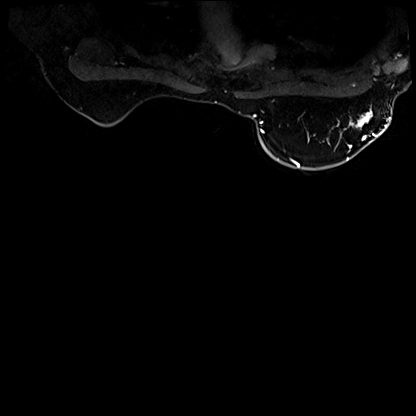
[im 160/160]
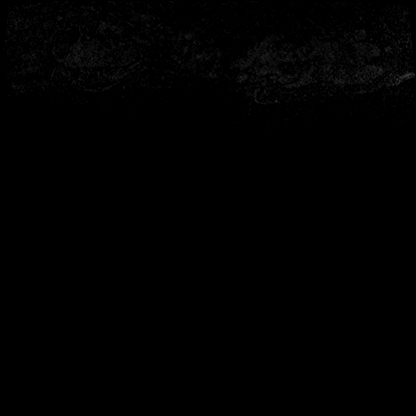

[Series 7: T1 · axial · 1.2mm · 0.77mm/px · z∈[-68,+123]mm · 6 of 160 slices shown (1 of 2)]
[im 1/160]
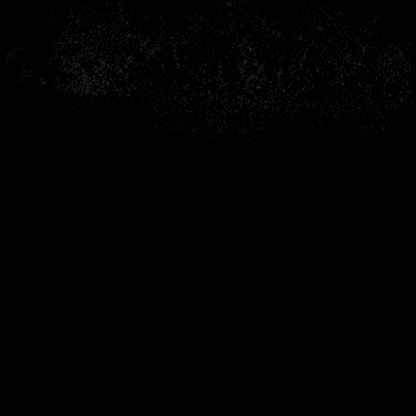
[im 32/160]
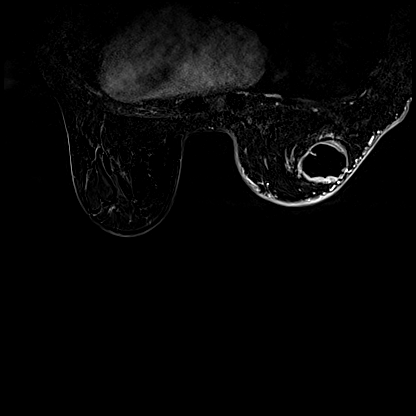
[im 64/160]
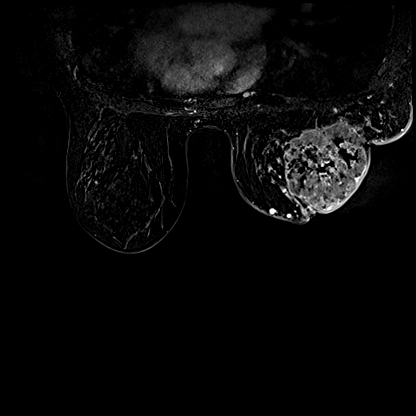
[im 96/160]
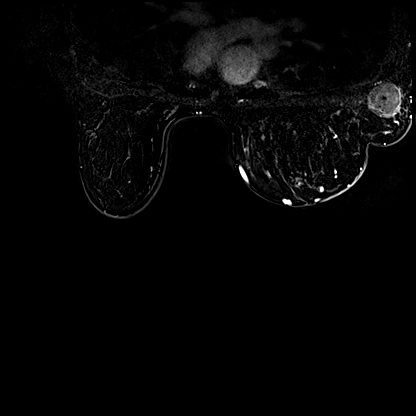
[im 128/160]
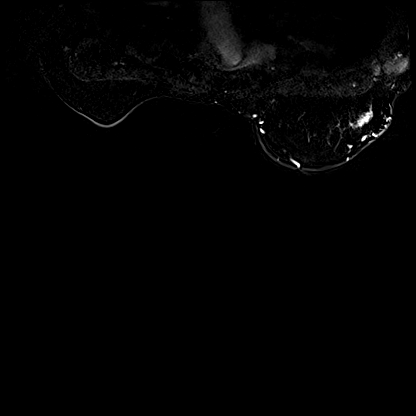
[im 160/160]
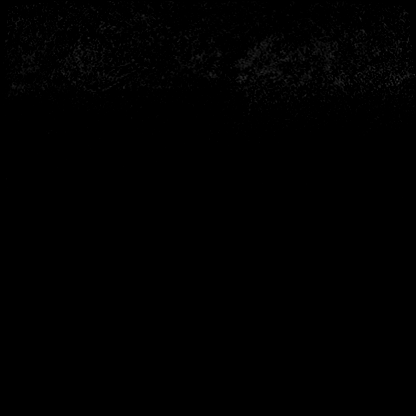

[Series 9: T1 · axial · 192.0mm · 0.77mm/px · 1 of 2 slices shown (2 of 2)]
[im 1/2]
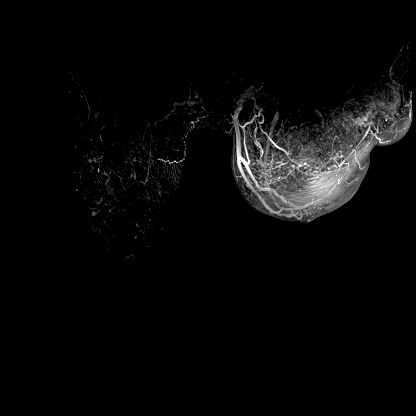

[Series 10: T1 fat-sat · axial · 1.2mm · 0.77mm/px · z∈[-68,+46]mm · 4 of 160 slices shown (4 of 4)]
[im 1/160]
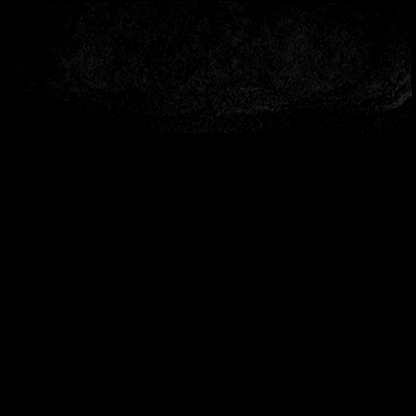
[im 32/160]
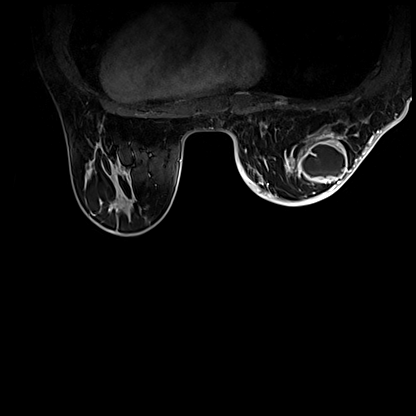
[im 64/160]
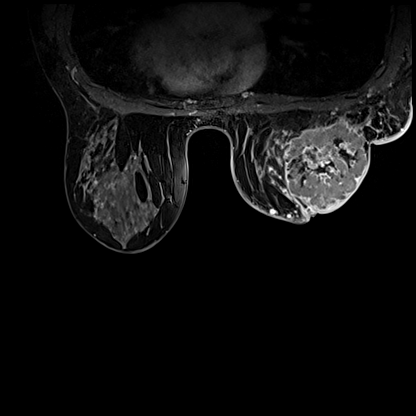
[im 96/160]
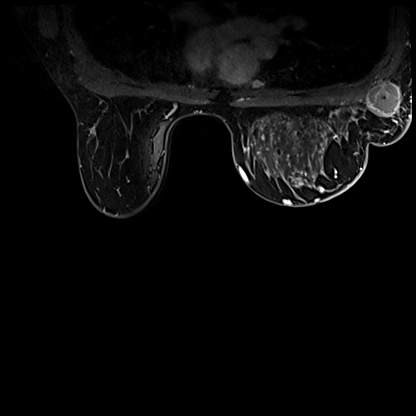

[28 of 48 positions shown; findings below may reference images not displayed]

Three-dimensional MR images were rendered by post-processing of the
original MR data on an independent workstation. The
three-dimensional MR images were interpreted, and findings are
reported in the following complete MRI report for this study. Three
dimensional images were evaluated at the independent [employer]
workstation
FINDINGS: Breast composition: c. Heterogeneous fibroglandular tissue.

Background parenchymal enhancement: Moderate.

Right breast: There is a large, complex enhancing mass involving the
entire central and lateral right breast consistent with the
patient's biopsy-proven malignancy. Overall measurements are 6.2 x
6.3 x 7.2 cm (AP by transverse by craniocaudal dimensions). The mass
abuts the overlying skin, which demonstrates thickening and
enhancement along the entire lateral breast and chest wall, as well
as portions of the lower inner quadrant. There is no underlying
pectoralis muscle involvement.

Left breast: There is a 6 mm focus of irregular enhancement in the
superior central left breast at middle depth (series 11, image
83/160). No other suspicious mass or abnormal enhancement.

Lymph nodes: At least 7 markedly abnormal level 1 lymph nodes are
noted in the right axilla. Abnormal level 2 and 3 level lymph nodes
are also partially visualized. There is no suspicious internal
mammary chain or left axillary lymphadenopathy.

Ancillary findings:  None.
IMPRESSION: 1. Biopsy-proven malignancy involving the entire central and lateral
right breast. The mass measures 7.2 cm in the greatest dimension.
2. Suspicious skin thickening and enhancement involving the entire
lateral right breast and chest wall as well as portions of the lower
inner quadrant.
3. 7+ markedly abnormal level 1 right axillary lymph nodes. Abnormal
level 2 and 3 lymph nodes are also partially visualized.
4. Indeterminate 6 mm focus of irregular enhancement in the superior
central left breast (series 11, image 83/160). Recommendation is to
proceed to MRI guided biopsy.

RECOMMENDATION:
1. MRI guided biopsy of the left breast.
2. Per clinical treatment plan on the right.

BI-RADS CATEGORY  4: Suspicious.

## 2020-07-14 ENCOUNTER — Ambulatory Visit: Payer: Medicaid Other

## 2020-07-15 ENCOUNTER — Inpatient Hospital Stay: Payer: Medicaid Other | Attending: Hematology and Oncology

## 2020-07-18 ENCOUNTER — Other Ambulatory Visit: Payer: Self-pay | Admitting: Hematology and Oncology

## 2020-07-18 MED ORDER — TAMOXIFEN CITRATE 20 MG PO TABS: 20.0000 mg | ORAL_TABLET | Freq: Every day | ORAL | 3 refills | Status: DC

## 2020-07-21 ENCOUNTER — Telehealth: Payer: Self-pay | Admitting: *Deleted

## 2020-07-21 NOTE — Telephone Encounter (Signed)
Spoke with the patient and scheduled a new patient appt for 2/7 with Dr Denman George at 10:30 am. Patient aware of phone and address to the clinic

## 2020-07-23 NOTE — Progress Notes (Incomplete)
Radiation Oncology         [phone] ________________________________  Name: Gina Ross MRN: [id number]  Date: 07/24/2020  DOB: 1971/04/11  Follow-Up Visit Note  CC: Patient, [name]  [name], MD  No diagnosis found.  Diagnosis: Stage IIIC (ypT0, ypN0) Right Breast Invasive Ductal Carcinoma, ER+ / PR- / Her2-, Grade 3  Interval Since Last Radiation: Two months, two weeks, and five days  Radiation Treatment Dates: 03/25/2020 through 05/05/2020 Site Technique Total Dose (Gy) Dose per Fx (Gy) Completed Fx Beam Energies  Chest Wall, Right: CW_Rt 3D 50/50 2 25/25 6X  Chest Wall, Right: CW_Rt_Bst Electron 10/10 2 5/5 6E  Chest Wall, Right: CW_Rt_SCV 3D 50/50 2 25/25 6X, 10X    Narrative:  The patient returns today for routine follow-up. No significant interval history since her last visit.  On review of systems, she reports ***. She denies ***.   ALLERGIES:  is allergic to penicillins.  Meds: Current Outpatient Medications  Medication Sig Dispense Refill  . acetaminophen (TYLENOL) 325 MG tablet Take 650 mg by mouth every 6 (six) hours as needed for moderate pain or headache.     . cetirizine (ZYRTEC) 10 MG tablet Take 10 mg by mouth daily as needed for allergies.    Marland Kitchen gabapentin (NEURONTIN) 100 MG capsule Take 100-300 mg by mouth See admin instructions. Take 100 mg at bedtime, may increase up to 300 mg at bedtime as needed for pain (Patient not taking: Reported on 06/05/2020)    . ibuprofen (ADVIL) 800 MG tablet Take 800 mg by mouth every 8 (eight) hours as needed for moderate pain.    Marland Kitchen lidocaine-prilocaine (EMLA) cream Apply to affected area once (Patient not taking: Reported on 06/05/2020) 30 g 3  . LORazepam (ATIVAN) 0.5 MG tablet Take 1 tablet (0.5 mg total) by mouth at bedtime as needed (Nausea or vomiting). (Patient not taking: Reported on 06/05/2020) 30 tablet 1  . methocarbamol (ROBAXIN) 500 MG tablet Take 1 tablet (500 mg total) by mouth 3 (three) times  daily. (Patient not taking: Reported on 06/05/2020) 30 tablet 1  . ondansetron (ZOFRAN) 8 MG tablet Take 1 tablet (8 mg total) by mouth 2 (two) times daily as needed. Start on the third day after chemotherapy. (Patient not taking: Reported on 06/05/2020) 30 tablet 1  . oxyCODONE (OXY IR/ROXICODONE) 5 MG immediate release tablet Take 1 tablet (5 mg total) by mouth every 4 (four) hours as needed for moderate pain. (Patient not taking: Reported on 06/05/2020) 12 tablet 0  . prochlorperazine (COMPAZINE) 10 MG tablet Take 1 tablet (10 mg total) by mouth every 6 (six) hours as needed (Nausea or vomiting). (Patient not taking: Reported on 06/05/2020) 30 tablet 1  . tamoxifen (NOLVADEX) 20 MG tablet Take 1 tablet (20 mg total) by mouth daily. 90 tablet 3  . traMADol (ULTRAM) 50 MG tablet Take 1 tablet (50 mg total) by mouth every 6 (six) hours as needed. (Patient not taking: Reported on 06/05/2020) 12 tablet 0   No current facility-administered medications for this encounter.    Physical Findings: The patient is in no acute distress. Patient is alert and oriented.  vitals were not taken for this visit.   Lungs are clear to auscultation bilaterally. Heart has regular rate and rhythm. No palpable cervical, supraclavicular, or axillary adenopathy. Abdomen soft, non-tender, normal bowel sounds. ***  Lab Findings: Lab Results  Component Value Date   WBC 3.4 (L) 01/17/2020   HGB 13.1 01/17/2020  HCT 39.3 01/17/2020   MCV 93.6 01/17/2020   PLT 254 01/17/2020    Radiographic Findings: No results found.  Impression: Stage IIIC (ypT0, ypN0) Right Breast Invasive Ductal Carcinoma, ER+ / PR- / Her2-, Grade 3  The patient is recovering from the effects of radiation. No clinical evidence of recurrence on exam today. ***  Plan: The patient is scheduled to follow up with Dr. Denman George on 08/04/2020 and with Wilber Bihari, PA-C, on 09/02/2020. She will follow up with radiation oncology in *** months.   Total time  spent in this encounter was *** minutes which included reviewing the patient's most recent interval history, physical examination, and documentation. __________________________________   Blair Promise, PhD, MD  This document serves as a record of services personally performed by [name], MD. It was created on his behalf by Clerance Lav, a trained medical scribe. The creation of this record is based on the scribe's personal observations and the provider's statements to them. This document has been checked and approved by the attending provider.

## 2020-07-24 ENCOUNTER — Ambulatory Visit
Admission: RE | Admit: 2020-07-24 | Discharge: 2020-07-24 | Disposition: A | Discharge status: Home / Self Care | Payer: Medicaid Other | Source: Ambulatory Visit | Attending: Radiation Oncology | Admitting: Radiation Oncology

## 2020-07-24 ENCOUNTER — Telehealth: Payer: Self-pay | Admitting: *Deleted

## 2020-07-24 NOTE — Telephone Encounter (Signed)
RETURNED PATIENT'S PHONE CALL, PATIENT WANTS TO CANCEL TODAY'S FU, SHE HAS BEEN RESCHEDULED FOR 08-07-20 @ 10:45 AM, PATIENT AGREED TO NEW DATE AND TIME

## 2020-07-31 ENCOUNTER — Encounter: Payer: Self-pay | Admitting: Gynecologic Oncology

## 2020-08-04 ENCOUNTER — Inpatient Hospital Stay: Payer: Medicaid Other | Attending: Gynecologic Oncology | Admitting: Gynecologic Oncology

## 2020-08-04 DIAGNOSIS — Z170 Estrogen receptor positive status [ER+]: Secondary | ICD-10-CM

## 2020-08-04 DIAGNOSIS — Z1501 Genetic susceptibility to malignant neoplasm of breast: Secondary | ICD-10-CM

## 2020-08-07 ENCOUNTER — Other Ambulatory Visit: Payer: Self-pay

## 2020-08-07 ENCOUNTER — Encounter: Payer: Self-pay | Admitting: Radiation Oncology

## 2020-08-07 ENCOUNTER — Ambulatory Visit
Admission: RE | Admit: 2020-08-07 | Discharge: 2020-08-07 | Disposition: A | Discharge status: Home / Self Care | Payer: Medicaid Other | Source: Ambulatory Visit | Attending: Radiation Oncology | Admitting: Radiation Oncology

## 2020-08-07 VITALS — BP 152/99 | HR 83 | Temp 97.8°F | Resp 18 | Ht 69.0 in | Wt 153.2 lb

## 2020-08-07 DIAGNOSIS — Z170 Estrogen receptor positive status [ER+]: Secondary | ICD-10-CM

## 2020-08-07 NOTE — Progress Notes (Signed)
Patient is here today for follow up to radiation to right breast that was completed November 2021.  Patient denies having any pain to the breast.  She denies swelling or limitations in range of motion.  She does report having numbness in the right axilla and right arm since post surgery.  Patient has some open areas to the right chest wall post mastectomy.  Skin has some hyperpigmentation to the right chest wall as well.  Vitals:   08/07/20 1011  BP: (!) 152/99  Pulse: 83  Resp: 18  Temp: 97.8 F (36.6 C)  SpO2: 100%  Weight: 153 lb 3.2 oz (69.5 kg)  Height: 5\' 9"  (1.753 m)

## 2020-08-07 NOTE — Progress Notes (Signed)
Radiation Oncology         [phone] ________________________________  Name: Gina Ross MRN: [id number]  Date: 08/07/2020  DOB: 1970/10/21  Follow-Up Visit Note  CC: Patient, [name]  [name], MD    ICD-10-CM   1. Malignant neoplasm of overlapping sites of right breast in female, estrogen receptor positive ([place])  C50.811    Z17.0     Diagnosis: Stage IIIC (ypT0, ypN0) Right Breast Invasive Ductal Carcinoma, ER+ / PR- / Her2-, Grade 3  Interval Since Last Radiation: Three months and two days  Radiation Treatment Dates: 03/25/2020 through 05/05/2020 Site Technique Total Dose (Gy) Dose per Fx (Gy) Completed Fx Beam Energies  Chest Wall, Right: CW_Rt 3D 50/50 2 25/25 6X  Chest Wall, Right: CW_Rt_Bst Electron 10/10 2 5/5 6E  Chest Wall, Right: CW_Rt_SCV 3D 50/50 2 25/25 6X, 10X    Narrative:  The patient returns today for routine follow-up. No significant interval history since her last visit.  On review of systems, she reports no further pain along the right chest wall area.  She denies any drainage from the area or problems with swelling in her arm or hand..  She missed her follow-up appointment with Dr. Denman George due to the icy roads but plans on rescheduling.  She also reports that her sister was diagnosed recently with breast cancer, apparently early stage but will have bilateral mastectomies given her BRCA mutation  ALLERGIES:  is allergic to penicillins.  Meds: Current Outpatient Medications  Medication Sig Dispense Refill  . acetaminophen (TYLENOL) 325 MG tablet Take 650 mg by mouth every 6 (six) hours as needed for moderate pain or headache.     . tamoxifen (NOLVADEX) 20 MG tablet Take 1 tablet (20 mg total) by mouth daily. 90 tablet 3  . cetirizine (ZYRTEC) 10 MG tablet Take 10 mg by mouth daily as needed for allergies. (Patient not taking: No sig reported)    . gabapentin (NEURONTIN) 100 MG capsule Take 100-300 mg by mouth See admin instructions. Take 100 mg  at bedtime, may increase up to 300 mg at bedtime as needed for pain (Patient not taking: No sig reported)    . ibuprofen (ADVIL) 800 MG tablet Take 800 mg by mouth every 8 (eight) hours as needed for moderate pain. (Patient not taking: No sig reported)     No current facility-administered medications for this encounter.    Physical Findings: The patient is in no acute distress. Patient is alert and oriented.  height is 5' 9"  (1.753 m) and weight is 153 lb 3.2 oz (69.5 kg). Her temperature is 97.8 F (36.6 C). Her blood pressure is 152/99 (abnormal) and her pulse is 83. Her respiration is 18 and oxygen saturation is 100%.   Lungs are clear to auscultation bilaterally. Heart has regular rate and rhythm. No palpable cervical, supraclavicular, or axillary adenopathy. Abdomen soft, non-tender, normal bowel sounds.  Examination left breast reveals no palpable mass nipple discharge or bleeding, somewhat pendulous.  The right chest wall area shows hyperpigmentation changes.  There is no signs of recurrence on the right chest wall area.  There is 2 small areas the largest at most a centimeter in size which is not completely covered over with skin, appears more to be dry skin anything else.  Lab Findings: Lab Results  Component Value Date   WBC 3.4 (L) 01/17/2020   HGB 13.1 01/17/2020   HCT 39.3 01/17/2020   MCV 93.6 01/17/2020   PLT 254 01/17/2020  Radiographic Findings: No results found.  Impression: Stage IIIC (ypT0, ypN0) Right Breast Invasive Ductal Carcinoma, ER+ / PR- / Her2-, Grade 3  The patient is recovering from the effects of radiation. No clinical evidence of recurrence on exam today.  I recommend the patient apply Aquaphor to the right chest wall in light of the significant skin dryness.  Plan: The patient is scheduled to follow up with Wilber Bihari, PA-C, on 09/02/2020. She will follow up with Dr. Denman George in the near future to consider surgery.  F/u with radiation oncology in 3  months.  If she has had complete healing of her right chest wall area then likely will defer follow-ups after that to medical oncology.  M  Total time spent in this encounter was 15 minutes which included reviewing the patient's most recent interval history, physical examination, and documentation. __________________________________   Blair Promise, PhD, MD  This document serves as a record of services personally performed by [name], MD. It was created on his behalf by Clerance Lav, a trained medical scribe. The creation of this record is based on the scribe's personal observations and the provider's statements to them. This document has been checked and approved by the attending provider.

## 2020-08-12 ENCOUNTER — Telehealth: Payer: Self-pay | Admitting: *Deleted

## 2020-08-12 ENCOUNTER — Telehealth: Payer: Self-pay | Admitting: Adult Health

## 2020-08-12 NOTE — Telephone Encounter (Signed)
Rescheduled appts per LC schedule change. Pt confirmed new appt date and time.

## 2020-08-12 NOTE — Telephone Encounter (Signed)
Patient called and rescheduled her missed appt from 2/7. Patient scheduled on 2/25 at 9 am

## 2020-08-21 ENCOUNTER — Encounter: Payer: Self-pay | Admitting: Gynecologic Oncology

## 2020-08-22 ENCOUNTER — Telehealth: Payer: Self-pay

## 2020-08-22 ENCOUNTER — Ambulatory Visit: Payer: Medicaid Other | Admitting: Gynecologic Oncology

## 2020-08-22 DIAGNOSIS — Z1501 Genetic susceptibility to malignant neoplasm of breast: Secondary | ICD-10-CM

## 2020-08-22 NOTE — Telephone Encounter (Signed)
Gina Ross called and LM stating that her transportation did not pick her up this am.        She called them several times.  They went to the wrong address.  She would like to reschedule her appointment.

## 2020-08-22 NOTE — Telephone Encounter (Signed)
Patient called back and stated "her transportation never showed up this morning. I want to reschedule the appt to the earliest available. Patient stated that she used the [facility] transportation." Explained that we were in clinic and we would call her back.   Called and spoke with [facility] transportation; patient had no transportation appt set up for today. Lst appt on file was in December

## 2020-09-02 ENCOUNTER — Ambulatory Visit (HOSPITAL_COMMUNITY)
Admission: EM | Admit: 2020-09-02 | Discharge: 2020-09-02 | Disposition: A | Discharge status: Home / Self Care | Payer: Medicaid Other | Attending: Emergency Medicine | Admitting: Emergency Medicine

## 2020-09-02 ENCOUNTER — Encounter (HOSPITAL_COMMUNITY): Payer: Self-pay

## 2020-09-02 ENCOUNTER — Other Ambulatory Visit: Payer: Self-pay

## 2020-09-02 ENCOUNTER — Encounter: Payer: Medicaid Other | Admitting: Adult Health

## 2020-09-02 ENCOUNTER — Ambulatory Visit (INDEPENDENT_AMBULATORY_CARE_PROVIDER_SITE_OTHER): Payer: Medicaid Other

## 2020-09-02 DIAGNOSIS — M79672 Pain in left foot: Secondary | ICD-10-CM | POA: Diagnosis not present

## 2020-09-02 DIAGNOSIS — M7989 Other specified soft tissue disorders: Secondary | ICD-10-CM

## 2020-09-02 DIAGNOSIS — S92532A Displaced fracture of distal phalanx of left lesser toe(s), initial encounter for closed fracture: Secondary | ICD-10-CM

## 2020-09-02 MED ORDER — IBUPROFEN 600 MG PO TABS
600.0000 mg | ORAL_TABLET | Freq: Four times a day (QID) | ORAL | 0 refills | Status: DC | PRN
Start: 2020-09-02 — End: 2020-09-02

## 2020-09-02 NOTE — ED Triage Notes (Signed)
Pt presents with left foot pain after hitting it on some furniture last night; pt states she hit that same foot on door frame about a week ago.

## 2020-09-02 NOTE — ED Provider Notes (Signed)
[name]    CSN: [id number] Arrival date & time: 09/02/20  1727      History   Chief Complaint Chief Complaint  Patient presents with  . Foot Pain    HPI Gina Ross is a 50 y.o. female.   Patient presents with pain and swelling in her left foot and toes after hitting them on a table last night.  She denies numbness, weakness, open wounds, redness, bruising, or other symptoms.  No treatments attempted at home.  Her medical history includes breast cancer, right mastectomy, asthma.  The history is provided by the patient and medical records.    Past Medical History:  Diagnosis Date  . Asthma    as a child  . Breast cancer ([place])   . Family history of breast cancer   . Family history of colon cancer   . Family history of prostate cancer     Patient Active Problem List   Diagnosis Date Noted  . Breast cancer, right ([place]) 01/24/2020  . Port-A-Cath in place 09/18/2019  . BRCA1 gene mutation positive in female 09/05/2019  . Genetic testing 09/05/2019  . Family history of breast cancer   . Family history of prostate cancer   . Family history of colon cancer   . Malignant neoplasm of overlapping sites of right breast in female, estrogen receptor positive ([place]) 08/17/2019  . Unilateral primary osteoarthritis, right knee 01/31/2017    Past Surgical History:  Procedure Laterality Date  . COLONOSCOPY    . HARDWARE REMOVAL Right 09/11/2014   Procedure: Removal Deep Hardware Right Knee;  Surgeon: Newt Minion, MD;  Location: [facility];  Service: Orthopedics;  Laterality: Right;  . IR IMAGING GUIDED PORT INSERTION  09/04/2019  . MASTECTOMY WITH AXILLARY LYMPH NODE DISSECTION Right 01/24/2020   Procedure: RIGHT MASTECTOMY WITH AXILLARY LYMPH NODE DISSECTION;  Surgeon: [name], MD;  Location: [facility];  Service: General;  Laterality: Right;  PEC BLOCK  . ORIF PATELLA Right 03/22/2014   Procedure: Open Reduction Internal Fixation Right [place] ;  Surgeon: Newt Minion, MD;  Location: [facility];  Service: Orthopedics;  Laterality: Right;    OB History   No obstetric history on file.      Home Medications    Prior to Admission medications   Medication Sig Start Date End Date Taking? Authorizing Provider  acetaminophen (TYLENOL) 325 MG tablet Take 650 mg by mouth every 6 (six) hours as needed for moderate pain or headache.     [provider]  cetirizine (ZYRTEC) 10 MG tablet Take 10 mg by mouth daily as needed for allergies.    [provider]  tamoxifen (NOLVADEX) 20 MG tablet Take 1 tablet (20 mg total) by mouth daily. 07/18/20   [name], MD    Family History Family History  Problem Relation Age of Onset  . Hypertension Mother   . Diabetes Mother   . Breast cancer Mother        dx. 48s  . Hypertension Father   . Colon cancer Paternal Aunt        dx. 32s  . Breast cancer Maternal Grandmother        bilateral  . Cervical cancer Maternal Grandmother        tumor on cervix  . Prostate cancer Maternal Uncle        dx. 37s  . Breast cancer Cousin        dx. late 14s  . Breast cancer Cousin  dx. <50  . Pancreatic cancer Neg Hx   . Endometrial cancer Neg Hx     Social History Social History   Tobacco Use  . Smoking status: Former Smoker    Packs/day: 0.10    Types: Cigarettes  . Smokeless tobacco: Never Used  . Tobacco comment: marijuana  Vaping Use  . Vaping Use: Never used  Substance Use Topics  . Alcohol use: Yes    Comment: socailly  . Drug use: Yes    Types: Marijuana     Allergies   Penicillins   Review of Systems Review of Systems  Constitutional: Negative for chills and fever.  HENT: Negative for ear pain and sore throat.   Eyes: Negative for pain and visual disturbance.  Respiratory: Negative for cough and shortness of breath.   Cardiovascular: Negative for chest pain and palpitations.  Gastrointestinal: Negative for abdominal pain and vomiting.  Genitourinary: Negative for  dysuria and hematuria.  Musculoskeletal: Positive for arthralgias. Negative for back pain.  Skin: Negative for color change and rash.  Neurological: Negative for syncope, weakness and numbness.  All other systems reviewed and are negative.    Physical Exam Triage Vital Signs ED Triage Vitals  Enc Vitals Group     BP      Pulse      Resp      Temp      Temp src      SpO2      Weight      Height      Head Circumference      Peak Flow      Pain Score      Pain Loc      Pain Edu?      Excl. in [place]?    No data found.  Updated Vital Signs BP 133/87 (BP Location: Left Arm)   Pulse 88   Temp 98.2 F (36.8 C) (Oral)   Resp 18   SpO2 99%   Visual Acuity Right Eye Distance:   Left Eye Distance:   Bilateral Distance:    Right Eye Near:   Left Eye Near:    Bilateral Near:     Physical Exam Vitals and nursing note reviewed.  Constitutional:      General: She is not in acute distress.    Appearance: She is well-developed and well-nourished. She is not ill-appearing.  HENT:     Head: Normocephalic and atraumatic.     Mouth/Throat:     Mouth: Mucous membranes are moist.  Eyes:     Conjunctiva/sclera: Conjunctivae normal.  Cardiovascular:     Rate and Rhythm: Normal rate and regular rhythm.     Heart sounds: No murmur heard.   Pulmonary:     Effort: Pulmonary effort is normal. No respiratory distress.     Breath sounds: Normal breath sounds.  Abdominal:     Palpations: Abdomen is soft.     Tenderness: There is no abdominal tenderness.  Musculoskeletal:        General: Swelling and tenderness present. No deformity or edema. Normal range of motion.     Cervical back: Neck supple.       Feet:  Skin:    General: Skin is warm and dry.     Capillary Refill: Capillary refill takes less than 2 seconds.     Findings: No bruising, erythema, lesion or rash.  Neurological:     General: No focal deficit present.     Mental Status: She is alert and  oriented to person,  place, and time.     Sensory: No sensory deficit.     Motor: No weakness.     Gait: Gait abnormal.  Psychiatric:        Mood and Affect: Mood and affect and mood normal.        Behavior: Behavior normal.      UC Treatments / Results  Labs (all labs ordered are listed, but only abnormal results are displayed) Labs Reviewed - No data to display  EKG   Radiology DG Foot Complete Left  Result Date: 09/02/2020 CLINICAL DATA:  Left foot pain for 1 week, pain and swelling at fourth digit and metatarsal EXAM: LEFT FOOT - COMPLETE 3+ VIEW COMPARISON:  08/13/2013 FINDINGS: Frontal, oblique, and lateral views of the left foot are obtained. There is a minimally displaced intra-articular fracture involving the distal margin of the fourth proximal phalanx, best seen on the oblique projection. Overlying soft tissue swelling. No other acute bony abnormalities. IMPRESSION: 1. Minimally displaced intra-articular fracture involving the distal margin fourth proximal phalanx. Electronically Signed   By: Randa Ngo M.D.   On: 09/02/2020 18:41    Procedures Procedures (including critical care time)  Medications Ordered in UC Medications - No data to display  Initial Impression / Assessment and Plan / UC Course  I have reviewed the triage vital signs and the nursing notes.  Pertinent labs & imaging results that were available during my care of the patient were reviewed by me and considered in my medical decision making (see chart for details).   Closed displaced fracture of left fourth toe.  X-ray shows "minimally displaced intra-articular fracture involving the distal margin fourth proximal phalanx."  Treating with Tylenol, rest, elevation, ice packs, buddy tape toes, postop shoe.  Patient is not able to take ibuprofen she reports.  Instructed her to follow-up with orthopedics if her symptoms are not improving.  She agrees to plan of care.   Final Clinical Impressions(s) / UC Diagnoses   Final  diagnoses:  Closed displaced fracture of distal phalanx of lesser toe of left foot, initial encounter     Discharge Instructions     Take Tylenol as needed for discomfort.  Rest and elevate your foot.  Apply ice packs 2-3 times a day for up to 20 minutes each.  Wear the post op shoe and keep your toes buddy taped as needed for comfort.    Follow up with an orthopedist as needed.       ED Prescriptions    Medication Sig Dispense Auth. Provider   ibuprofen (ADVIL) 600 MG tablet  (Status: Discontinued) Take 1 tablet (600 mg total) by mouth every 6 (six) hours as needed. 30 tablet Sharion Balloon, NP     PDMP not reviewed this encounter.   Sharion Balloon, NP 09/02/20 [phone]

## 2020-09-02 NOTE — Discharge Instructions (Signed)
Take Tylenol as needed for discomfort.  Rest and elevate your foot.  Apply ice packs 2-3 times a day for up to 20 minutes each.  Wear the post op shoe and keep your toes buddy taped as needed for comfort.    Follow up with an orthopedist as needed.

## 2020-09-12 ENCOUNTER — Encounter: Payer: Medicaid Other | Admitting: Adult Health

## 2020-09-15 ENCOUNTER — Telehealth: Payer: Self-pay | Admitting: Adult Health

## 2020-09-15 NOTE — Telephone Encounter (Signed)
Scheduled 3/31 appt per sch msg. Patient is already aware of appt

## 2020-09-22 ENCOUNTER — Telehealth: Payer: Self-pay

## 2020-09-22 NOTE — Telephone Encounter (Signed)
Told Gina Ross that she missed 2 appointments with Dr. Denman George. She needs to see one of the Ob/Gyns  with [facility]  to discuss surgery. Pt will call Dr. Geralyn Flash office to discuss surgeon with him.

## 2020-09-25 ENCOUNTER — Inpatient Hospital Stay: Payer: Medicaid Other | Attending: Hematology and Oncology | Admitting: Adult Health

## 2020-09-30 ENCOUNTER — Telehealth: Payer: Self-pay | Admitting: *Deleted

## 2020-09-30 NOTE — Telephone Encounter (Signed)
Spoke with the patient and scheduled a new patient appt for 4/8 at 9:45 am with Dr Berline Lopes. Patient given an arrival time of 9:15am. Patient aware of the address and phone number for the clinic. Patient also aware of policy for mask and visitors

## 2020-09-30 NOTE — Telephone Encounter (Signed)
Called the patient and moved her appt from 4/8 to 4/12

## 2020-10-03 ENCOUNTER — Ambulatory Visit: Payer: Medicaid Other | Admitting: Gynecologic Oncology

## 2020-10-07 ENCOUNTER — Inpatient Hospital Stay: Payer: Medicaid Other | Attending: Hematology and Oncology

## 2020-10-07 ENCOUNTER — Inpatient Hospital Stay (HOSPITAL_BASED_OUTPATIENT_CLINIC_OR_DEPARTMENT_OTHER): Payer: Medicaid Other | Admitting: Gynecologic Oncology

## 2020-10-07 ENCOUNTER — Other Ambulatory Visit: Payer: Self-pay

## 2020-10-07 ENCOUNTER — Other Ambulatory Visit (HOSPITAL_COMMUNITY)
Admission: RE | Admit: 2020-10-07 | Discharge: 2020-10-07 | Disposition: A | Discharge status: Home / Self Care | Payer: Medicaid Other | Source: Ambulatory Visit | Attending: Gynecologic Oncology | Admitting: Gynecologic Oncology

## 2020-10-07 ENCOUNTER — Encounter: Payer: Self-pay | Admitting: Gynecologic Oncology

## 2020-10-07 VITALS — BP 139/99 | HR 94 | Temp 98.7°F | Resp 20 | Ht 69.0 in | Wt 153.0 lb

## 2020-10-07 DIAGNOSIS — Z170 Estrogen receptor positive status [ER+]: Secondary | ICD-10-CM | POA: Insufficient documentation

## 2020-10-07 DIAGNOSIS — C50811 Malignant neoplasm of overlapping sites of right female breast: Secondary | ICD-10-CM | POA: Insufficient documentation

## 2020-10-07 DIAGNOSIS — Z1501 Genetic susceptibility to malignant neoplasm of breast: Secondary | ICD-10-CM | POA: Diagnosis not present

## 2020-10-07 DIAGNOSIS — Z124 Encounter for screening for malignant neoplasm of cervix: Secondary | ICD-10-CM | POA: Insufficient documentation

## 2020-10-07 DIAGNOSIS — Z923 Personal history of irradiation: Secondary | ICD-10-CM | POA: Insufficient documentation

## 2020-10-07 DIAGNOSIS — Z1502 Genetic susceptibility to malignant neoplasm of ovary: Secondary | ICD-10-CM | POA: Insufficient documentation

## 2020-10-07 DIAGNOSIS — Z1509 Genetic susceptibility to other malignant neoplasm: Secondary | ICD-10-CM | POA: Insufficient documentation

## 2020-10-07 DIAGNOSIS — Z148 Genetic carrier of other disease: Secondary | ICD-10-CM | POA: Insufficient documentation

## 2020-10-07 DIAGNOSIS — C50919 Malignant neoplasm of unspecified site of unspecified female breast: Secondary | ICD-10-CM

## 2020-10-07 DIAGNOSIS — Z9221 Personal history of antineoplastic chemotherapy: Secondary | ICD-10-CM | POA: Insufficient documentation

## 2020-10-07 DIAGNOSIS — Z0000 Encounter for general adult medical examination without abnormal findings: Secondary | ICD-10-CM

## 2020-10-07 NOTE — Patient Instructions (Signed)
We will contact you with the results of your pap smear from today, your CA 125 blood test, and your ultrasound once completed. Please call the office for any questions or concerns. When the new schedule is available, we will contact you about selecting a surgical date when you are ready.

## 2020-10-07 NOTE — Progress Notes (Signed)
GYNECOLOGIC ONCOLOGY NEW PATIENT CONSULTATION   Patient Name: Gina Ross  Patient Age: 50 y.o. Date of Service: 10/07/20 Referring Provider: [name] MD  Primary Care Provider: Patient, [name] (Inactive) Consulting Provider: Jeral Pinch, MD   Assessment/Plan:  Peri-menopausal patient with recent history of ER+ breast cancer now on anti-estrogen therapy and BRCA1 mutation.   We discussed the role of surgery both from a therapeutic standpoint (in the setting of ER+ breast cancer) as well as risk reduction (given BRCA1 mutation). The risk of ovarian cancer in patients with BRCA 1 mutations is approximately 40% by the age of 53.  In addition, patients with BRCA mutations are also at increased risk of breast cancer, and an increased (but still low) risk of pancreatic cancer and melanoma.  The [employer]  (NCCN) recommends removal of bilateral ovaries and fallopian tubes between the ages of 57-40, and/or when childbearing is completed, to reduce the risk of ovarian and fallopian tube cancer.  If a patient does not undergo risk-reducing surgery, it is recommended to offer/discuss the role of CA-125 serum levels and pelvic ultrasounds every 6 months as a proxy for a screening approach, although these are not particularly sensitive methods of screening. Preventive surgery to remove the ovaries and fallopian tubes reduces the risk of a related cancer by 80% in women who carry a BRCA1 or BRCA2 mutation.  Women who undergo preventive surgery retain a 4% risk of developing cancer of the peritoneum.   We also discussed the pros and cons of removing the uterus. There is retrospective data that identifies an association with developing serous endometrial cancer in patients with a BRCA1 mutation. The risk of developing serous endometrial cancer in BRCA1 patients appears to be around 2% with relative risk increase near 20x.  We reviewed the limitations of retrospective  data.   After this discussion, Ms. Duerr would like to proceed with scheduling surgery but would like to defer surgery for at least 3-4 months given work schedule. I offered  CA-125 and pelvic ultrasound at this time. She would like to undergo both tests and will call my office in the next few months when she is ready to move forward with getting surgery scheduled.   A copy of this note was sent to the patient's referring provider.   60 minutes of total time was spent for this patient encounter, including preparation, face-to-face counseling with the patient and coordination of care, and documentation of the encounter.  Jeral Pinch, MD  Division of Gynecologic Oncology  Department of Obstetrics and Gynecology  [facility]  ___________________________________________  Chief Complaint: Chief Complaint  Patient presents with  . BRCA1 gene mutation positive in female    History of Present Illness:  Gina Ross is a 50 y.o. y.o. female who is seen in consultation at the request of Dr. Lindi Adie for an evaluation of risk reducing and therapeutic surgery in the setting of BRCA1 mutation and ER positive breast cancer.  Patient has a history of stage III breast cancer with her treatment history outlined below.  She had genetic testing that showed a pathogenic BRCA1 mutation.  Additionally her tumor was ER positive.  She has now completed neoadjuvant chemotherapy, surgery, radiation, and started antiestrogen therapy with tamoxifen in January of this year.  Today, she reports overall doing well.  She denies any significant side effects with tamoxifen.  She is taking it at night.  She continues to have hot flashes, which started with  chemotherapy.  She denies any changes to these since starting tamoxifen.  Her menses stopped with chemotherapy.  She denies any vaginal bleeding or discharge since.  She endorses a good appetite without nausea or emesis.  She reports  regular bowel and bladder function.  Her medical history is otherwise fairly unremarkable.  She has a remote history of asthma but has not had any issues related to this in recent years.  She also has a remote history of an abnormal Pap smear requiring either a biopsy or an excisional procedure.  She thinks this was at least 10-20 years ago.  She lives in [place] with her 66 year old son.  She endorses a history of tobacco use although quit before she started treatment.  She denies any alcohol use.  She works as a Optician, dispensing.  Treatment History: Oncology History  Malignant neoplasm of overlapping sites of right breast in female, estrogen receptor positive ([place])  08/17/2019 Initial Diagnosis   Patient palpated a right breast mass and right axilla mass and noted skin redness x2 weeks. She went to the ED and had an abscess drained with no improvement. Diagnostic mammogram and US showed a 7.3cm right breast mass extending into all 4 quadrants and partially into overlying skin with 12 abnormal right axillary lymph nodes. Biopsy  showed IDC with necrosis in the breast and axilla, grade 3, HER-2 equivocal by IHC, negative by FISH, ER+ 40% weak, PR -, Ki67 90%.    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 The report date is 09/03/2019.  The Common Hereditary Cancers Panel offered by [facility] includes sequencing and/or deletion duplication testing of the following 48 genes: APC, ATM, AXIN2, BARD1, BMPR1A, BRCA1, BRCA2, BRIP1, CDH1, CDK4, CDKN2A (p14ARF), CDKN2A (p16INK4a), CHEK2, CTNNA1, DICER1, EPCAM (Deletion/duplication testing only), GREM1 (promoter region deletion/duplication testing only), KIT, MEN1, MLH1, MSH2, MSH3, MSH6, MUTYH, NBN, NF1, NHTL1, PALB2, PDGFRA, PMS2, POLD1, POLE, PTEN, RAD50, RAD51C, RAD51D, RNF43, SDHB, SDHC, SDHD, SMAD4, SMARCA4. STK11, TP53, TSC1,  TSC2, and VHL.  The following genes were evaluated for sequence changes only: SDHA and HOXB13 c.251G>A variant only.    09/04/2019 - 12/18/2019 Neo-Adjuvant Chemotherapy   Adriamycin and Cytoxan x 4 (09/04/2019 - 10/16/2019 Taxol x 8 (10/30/2019 - 12/18/2019), stopped for neuropathy   01/24/2020 Surgery   Right mastectomy [name]) [phone]): no residual carcinoma, 5 lymph nodes negative for carcinoma.   03/25/2020 - 05/05/2020 Radiation Therapy   The patient initially received a dose of 50 Gy in 25 fractions to the breast and right supraclavicular area using whole-breast tangent fields. This was delivered using a 3-D conformal technique. The pt received a boost delivering an additional 10 Gy in 5 fractions using a electron boost with 49mV electrons. The total dose was 60 Gy.   06/2020 -  Anti-estrogen oral therapy   Tamoxifen. 1/2 tablet daily for a month, then increase to 20 mg daily.    PAST MEDICAL HISTORY:  Past Medical History:  Diagnosis Date  . Asthma    as a child  . 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 Breast cancer ([place]   . Family history of breast cancer   . Family history of colon cancer   . Family history of prostate cancer      PAST SURGICAL HISTORY:  Past Surgical History:  Procedure Laterality Date  . COLONOSCOPY    . HARDWARE REMOVAL Right 09/11/2014  Procedure: Removal Deep Hardware Right Knee;  Surgeon: Newt Minion, MD;  Location: [place];  Service: Orthopedics;  Laterality: Right;  . IR IMAGING GUIDED PORT INSERTION  09/04/2019  . MASTECTOMY WITH AXILLARY LYMPH NODE DISSECTION Right 01/24/2020   Procedure: RIGHT MASTECTOMY WITH AXILLARY LYMPH NODE DISSECTION;  Surgeon: [name], MD;  Location: [place];  Service: General;  Laterality: Right;  PEC BLOCK  . ORIF PATELLA Right 03/22/2014   Procedure: Open Reduction Internal Fixation Right Patella ;  Surgeon: Newt Minion, MD;  Location: [facility];  Service: Orthopedics;  Laterality: Right;    OB/GYN  HISTORY:  OB History  Gravida Para Term Preterm AB Living  1 1          SAB IAB Ectopic Multiple Live Births               # Outcome Date GA Lbr Len/2nd Weight Sex Delivery Anes PTL Lv  1 Para             No LMP recorded. (Menstrual status: Chemotherapy).  Age at menarche: 27 Age at menopause: 41 Hx of HRT: Denies Hx of STDs: Remote history of trichomonas Last pap: Patient thinks she may have had a Pap smear last year but does not remember History of abnormal pap smears: Yes, see HPI  SCREENING STUDIES:  Last mammogram: 2021  Last colonoscopy: N/A  MEDICATIONS: Outpatient Encounter Medications as of 10/07/2020  Medication Sig  . acetaminophen (TYLENOL) 325 MG tablet Take 650 mg by mouth every 6 (six) hours as needed for moderate pain or headache.   . loratadine (CLARITIN) 10 MG tablet Take 10 mg by mouth daily.  . tamoxifen (NOLVADEX) 20 MG tablet Take 1 tablet (20 mg total) by mouth daily.  . cetirizine (ZYRTEC) 10 MG tablet Take 10 mg by mouth daily as needed for allergies. (Patient not taking: Reported on 10/07/2020)   No facility-administered encounter medications on file as of 10/07/2020.    ALLERGIES:  Allergies  Allergen Reactions  . Penicillins Swelling     FAMILY HISTORY:  Family History  Problem Relation Age of Onset  . Hypertension Mother   . Diabetes Mother   . Breast cancer Mother        dx. 41s  . Hypertension Father   . Colon cancer Paternal Aunt        dx. 39s  . Breast cancer Maternal Grandmother        bilateral  . Cervical cancer Maternal Grandmother        tumor on cervix  . Prostate cancer Maternal Uncle        dx. 58s  . Breast cancer Cousin        dx. late 55s  . Breast cancer Cousin        dx. <50  . Pancreatic cancer Neg Hx   . Endometrial cancer Neg Hx      SOCIAL HISTORY:  Social Connections: Not on file    REVIEW OF SYSTEMS:  Denies appetite changes, fevers, chills, fatigue, unexplained weight changes. Denies hearing  loss, neck lumps or masses, mouth sores, ringing in ears or voice changes. Denies cough or wheezing.  Denies shortness of breath. Denies chest pain or palpitations. Denies leg swelling. Denies abdominal distention, pain, blood in stools, constipation, diarrhea, nausea, vomiting, or early satiety. Denies pain with intercourse, dysuria, frequency, hematuria or incontinence. Denies hot flashes, pelvic pain, vaginal bleeding or vaginal discharge.   Denies joint pain, back pain or muscle pain/cramps.  Denies itching, rash, or wounds. Denies dizziness, headaches, numbness or seizures. Denies swollen lymph nodes or glands, denies easy bruising or bleeding. Denies anxiety, depression, confusion, or decreased concentration.  Physical Exam:  Vital Signs for this encounter:  Blood pressure (!) 139/99, pulse 94, temperature 98.7 F (37.1 C), temperature source Oral, resp. rate 20, height 5' 9" (1.753 m), weight 153 lb (69.4 kg), SpO2 100 %. Body mass index is 22.59 kg/m. General: Alert, oriented, no acute distress.  HEENT: Normocephalic, atraumatic. Sclera anicteric.  Chest: Clear to auscultation bilaterally. No wheezes, rhonchi, or rales. Cardiovascular: Regular rate and rhythm, no murmurs, rubs, or gallops.  Abdomen: Normoactive bowel sounds. Soft, nondistended, nontender to palpation. No masses or hepatosplenomegaly appreciated. No palpable fluid wave.  Extremities: Grossly normal range of motion. Warm, well perfused. No edema bilaterally.  Skin: No rashes or lesions.  Lymphatics: No cervical, supraclavicular, or inguinal adenopathy.  GU:  Normal external female genitalia.  No lesions. No discharge or bleeding.             Bladder/urethra:  No lesions or masses, well supported bladder.             Vagina: No lesions or masses.  Small amount of physiologic discharge.  Cervix somewhat enlarged but normal-appearing.  Pap test and HPV collected.             Cervix: Normal appearing, no lesions.              Uterus: Enlarged, 12-14 cm, mobile, no parametrial involvement or nodularity.             Adnexa: No masses appreciated.  LABORATORY AND RADIOLOGIC DATA:  Outside medical records were reviewed to synthesize the above history, along with the history and physical obtained during the visit.   Lab Results  Component Value Date   WBC 3.4 (L) 01/17/2020   HGB 13.1 01/17/2020   HCT 39.3 01/17/2020   PLT 254 01/17/2020   GLUCOSE 94 01/17/2020   ALT 52 (H) 01/17/2020   AST 37 01/17/2020   NA 141 01/17/2020   K 3.8 01/17/2020   CL 107 01/17/2020   CREATININE 0.98 01/17/2020   BUN 11 01/17/2020   CO2 24 01/17/2020   INR 1.0 09/04/2019   CT A/P on 08/2019: IMPRESSION: 1. Very large, central right breast mass measuring least 7.0 cm, which appears to maintain a distinct fat plane from the underlying pectoral muscle. 2. Bulky right axillary and subpectoral lymph nodes, consistent with nodal metastatic disease. 3. No evidence of distant metastatic disease within the chest, abdomen or pelvis. 4. Multiple bulky uterine fibroids. 5. Small volume fluid in the low pelvis, likely functional. 6.  Aortic Atherosclerosis (ICD10-I70.0).

## 2020-10-08 ENCOUNTER — Telehealth: Payer: Self-pay | Admitting: Oncology

## 2020-10-08 LAB — CA 125: Cancer Antigen (CA) 125: 11.7 U/mL (ref 0.0–38.1)

## 2020-10-08 NOTE — Telephone Encounter (Signed)
Called [place] with normal CA 125 results.  She verbalized understanding and agreement.

## 2020-10-13 LAB — CYTOLOGY - PAP
Comment: NEGATIVE
Diagnosis: NEGATIVE
High risk HPV: NEGATIVE

## 2020-10-14 ENCOUNTER — Telehealth: Payer: Self-pay

## 2020-10-14 ENCOUNTER — Ambulatory Visit (HOSPITAL_COMMUNITY): Admission: RE | Admit: 2020-10-14 | Payer: Medicaid Other | Source: Ambulatory Visit

## 2020-10-14 NOTE — Telephone Encounter (Signed)
Told ms Lenoir that her pap smear was normal and HPV high risk negative per Melissa Cross,NP.

## 2020-10-23 ENCOUNTER — Telehealth: Payer: Self-pay | Admitting: *Deleted

## 2020-10-23 NOTE — Telephone Encounter (Signed)
Rescheduled the patient's [place] scan for 5/6 at 3:30 pm, patient aware

## 2020-10-28 ENCOUNTER — Encounter: Payer: Self-pay | Admitting: Radiology

## 2020-10-31 ENCOUNTER — Ambulatory Visit (HOSPITAL_COMMUNITY): Admission: RE | Admit: 2020-10-31 | Payer: Medicaid Other | Source: Ambulatory Visit

## 2020-11-05 NOTE — Progress Notes (Signed)
Radiation Oncology         [phone] ________________________________  Name: Gina Ross MRN: [id number]  Date: 11/06/2020  DOB: August 08, 1970  Follow-Up Visit Note  CC: Patient, [name] (Inactive)  [name], MD    ICD-10-CM   1. Malignant neoplasm of overlapping sites of right breast in female, estrogen receptor positive ([place])  C50.811    Z17.0   2. Malignant neoplasm of right female breast, unspecified estrogen receptor status, unspecified site of breast ([place])  C50.911   3. 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 She has not seen medical oncology since her last visit. She continues on tamoxifen.  Since her last visit, she was referred to Dr. Berline Lopes to discuss risk reducing and therapeutic surgery in the setting of BRCA1 mutation and ER positive breast cancer. The patient agreed to proceed but wanted to defer surgery for at least 3-4 months given her work schedule. She was recommended to undergo pelvic ultrasound, but this has not yet been completed, as she no-showed to her last two appointments.  Left 3D mammogram is ordered but has not been scheduled.  On review of systems, she reports some puffiness to the lateral chest wall area past her mastectomy scar.  She denies any swelling in her right arm or hand or numbness.  She denies any itching or discomfort along the right chest wall area or pain within the left breast area.  She denies any nipple  discharge or bleeding along the left side.  ALLERGIES:  is allergic to penicillins.  Meds: Current Outpatient Medications  Medication Sig Dispense Refill  . acetaminophen (TYLENOL) 325 MG tablet Take 650 mg by mouth every 6 (six) hours as needed for moderate pain or headache.     . cetirizine (ZYRTEC) 10 MG tablet Take 10 mg by mouth daily as needed for allergies.    Marland Kitchen loratadine (CLARITIN) 10 MG tablet Take 10 mg by mouth daily.    . tamoxifen (NOLVADEX) 20 MG tablet Take 1 tablet (20 mg total) by mouth daily. 90 tablet 3   No current facility-administered medications for this encounter.    Physical Findings: The patient is in no acute distress. Patient is alert and oriented.  height is $RemoveB'5\' 9"'kLhMzwxS$  (1.753 m) and weight is 151 lb 9.6 oz (68.8 kg). Her temperature is 97.6 F (36.4 C). Her blood pressure is 135/103 (abnormal) and her pulse is 83. Her respiration is 18 and oxygen saturation is 100%.   Lungs are clear to auscultation bilaterally. Heart has regular rate and rhythm. No palpable cervical, supraclavicular, or axillary adenopathy. Abdomen soft, non-tender, normal bowel sounds.  Examination left breast reveals no palpable mass nipple discharge or bleeding, somewhat pendulous.  The right chest wall area shows hyperpigmentation changes.  There is no signs of recurrence on the right chest wall area.  The right chest wall area has completely healed at this time.  In addition to hyperpigmentation changes there are some areas of hypopigmentation changes in the central part of the chest region.  Some puffiness to the soft tissues lateral to the mastectomy scar region.  No signs of recurrence in this area.  Lab Findings: Lab Results  Component Value Date   WBC 3.4 (L) 01/17/2020   HGB 13.1 01/17/2020   HCT 39.3 01/17/2020   MCV 93.6 01/17/2020   PLT 254 01/17/2020    Radiographic Findings: No results found.   Impression: Stage IIIC (ypT0, ypN0) Right Breast Invasive Ductal Carcinoma, ER+ /  PR- / Her2-, Grade 3  The patient is recovering from the effects of radiation. No evidence of recurrence on exam today.   Plan: As needed follow-up in radiation oncology.  She will continue to follow-up with medical oncology every 6 months and will remain on tamoxifen.  She undergo risk reducing surgery in the pelvis region in the near future under the direction of Dr. Berline Lopes.  I have encouraged her to schedule her left mammogram.  __________________________________   Blair Promise, PhD, MD  This document serves as a record of services personally performed by [name], MD. It was created on his behalf by Roney Mans, a trained medical scribe. The creation of this record is based on the scribe's personal observations and the provider's statements to them. This document has been checked and approved by the attending provider.

## 2020-11-06 ENCOUNTER — Encounter: Payer: Self-pay | Admitting: Radiation Oncology

## 2020-11-06 ENCOUNTER — Ambulatory Visit
Admission: RE | Admit: 2020-11-06 | Discharge: 2020-11-06 | Disposition: A | Discharge status: Home / Self Care | Payer: Medicaid Other | Source: Ambulatory Visit | Attending: Radiation Oncology | Admitting: Radiation Oncology

## 2020-11-06 ENCOUNTER — Other Ambulatory Visit: Payer: Self-pay

## 2020-11-06 VITALS — BP 135/103 | HR 83 | Temp 97.6°F | Resp 18 | Ht 69.0 in | Wt 151.6 lb

## 2020-11-06 DIAGNOSIS — Z79810 Long term (current) use of selective estrogen receptor modulators (SERMs): Secondary | ICD-10-CM | POA: Diagnosis not present

## 2020-11-06 DIAGNOSIS — Z1501 Genetic susceptibility to malignant neoplasm of breast: Secondary | ICD-10-CM | POA: Diagnosis not present

## 2020-11-06 DIAGNOSIS — Z170 Estrogen receptor positive status [ER+]: Secondary | ICD-10-CM | POA: Insufficient documentation

## 2020-11-06 DIAGNOSIS — Z923 Personal history of irradiation: Secondary | ICD-10-CM | POA: Diagnosis not present

## 2020-11-06 DIAGNOSIS — Z1509 Genetic susceptibility to other malignant neoplasm: Secondary | ICD-10-CM

## 2020-11-06 DIAGNOSIS — C50811 Malignant neoplasm of overlapping sites of right female breast: Secondary | ICD-10-CM | POA: Insufficient documentation

## 2020-11-06 DIAGNOSIS — C50911 Malignant neoplasm of unspecified site of right female breast: Secondary | ICD-10-CM

## 2020-11-06 NOTE — Progress Notes (Signed)
Gina Ross is here today for follow up post radiation to the breast.   Breast Side:right   They completed their radiation on: 05/05/2020   Does the patient complain of any of the following: . Post radiation skin issues: denies . Breast Tenderness: denies . Breast Swelling: mild right breast "puffiness" . Lymphadema: denies . Range of Motion limitations: denies . Fatigue post radiation: denies . Appetite good/fair/poor: good . Sleeping well  Additional comments if applicable: none   Vitals:   11/06/20 1035  BP: (!) 135/103  Pulse: 83  Resp: 18  Temp: 97.6 F (36.4 C)  SpO2: 100%  Weight: 151 lb 9.6 oz (68.8 kg)  Height: 5\' 9"  (1.753 m)

## 2020-11-07 ENCOUNTER — Telehealth: Payer: Self-pay | Admitting: Hematology and Oncology

## 2020-11-07 NOTE — Telephone Encounter (Signed)
Scheduled appt per 5/13 sch msg. Pt aware.  

## 2020-11-23 ENCOUNTER — Ambulatory Visit (HOSPITAL_COMMUNITY)
Admission: EM | Admit: 2020-11-23 | Discharge: 2020-11-23 | Disposition: A | Discharge status: Home / Self Care | Payer: Medicaid Other | Attending: Student | Admitting: Student

## 2020-11-23 ENCOUNTER — Encounter (HOSPITAL_COMMUNITY): Payer: Self-pay

## 2020-11-23 DIAGNOSIS — L249 Irritant contact dermatitis, unspecified cause: Secondary | ICD-10-CM

## 2020-11-23 MED ORDER — TRIAMCINOLONE ACETONIDE 0.5 % EX OINT: 1.0000 "application " | TOPICAL_OINTMENT | Freq: Two times a day (BID) | CUTANEOUS | 0 refills | Status: DC

## 2020-11-23 MED ORDER — TRIAMCINOLONE ACETONIDE 0.5 % EX OINT: 1.0000 "application " | TOPICAL_OINTMENT | Freq: Two times a day (BID) | CUTANEOUS | 0 refills | Status: AC

## 2020-11-23 NOTE — ED Provider Notes (Signed)
[facility]    CSN: [id number] Arrival date & time: 11/23/20  1644      History   Chief Complaint Chief Complaint  Patient presents with  . Allergic Reaction    HPI Gina Ross is a 50 y.o. female presenting with bilateral hand irritation following wearing nitrile gloves at work. Medical history cancer in remission. States she used a combination of hand sanitizer and powder free nitrile gloves at work 1 day ago and following this developed red irritation on her hands.  She has washed her hands and applied hand cream without improvement. Describes this as itching and throbbing .denies discharge, fevers/chils.  HPI  Past Medical History:  Diagnosis Date  . Asthma    as a child  . 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 Breast cancer ([place])   . Family history of breast cancer   . Family history of colon cancer   . Family history of prostate cancer   . History of radiation therapy 03/25/2020-05/05/2020   IMRT to right breast     Dr [name]    Patient Active Problem List   Diagnosis Date Noted  . Breast cancer, right ([place]) 01/24/2020  . Port-A-Cath in place 09/18/2019  . BRCA1 gene mutation positive in female 09/05/2019  . Genetic testing 09/05/2019  . Family history of breast cancer   . Family history of prostate cancer   . Family history of colon cancer   . Malignant neoplasm of overlapping sites of right breast in female, estrogen receptor positive ([place]) 08/17/2019  . Unilateral primary osteoarthritis, right knee 01/31/2017    Past Surgical History:  Procedure Laterality Date  . COLONOSCOPY    . HARDWARE REMOVAL Right 09/11/2014   Procedure: Removal Deep Hardware Right Knee;  Surgeon: Newt Minion, MD;  Location: [facility];  Service: Orthopedics;  Laterality: Right;  . IR IMAGING GUIDED PORT INSERTION  09/04/2019  . MASTECTOMY WITH AXILLARY LYMPH NODE DISSECTION Right 01/24/2020   Procedure: RIGHT MASTECTOMY WITH AXILLARY LYMPH NODE DISSECTION;   Surgeon: [name], MD;  Location: [facility];  Service: General;  Laterality: Right;  PEC BLOCK  . ORIF PATELLA Right 03/22/2014   Procedure: Open Reduction Internal Fixation Right [place] ;  Surgeon: Newt Minion, MD;  Location: [facility];  Service: Orthopedics;  Laterality: Right;    OB History    Gravida  1   Para  1   Term      Preterm      AB      Living        SAB      IAB      Ectopic      Multiple      Live Births               Home Medications    Prior to Admission medications   Medication Sig Start Date End Date Taking? Authorizing Provider  acetaminophen (TYLENOL) 325 MG tablet Take 650 mg by mouth every 6 (six) hours as needed for moderate pain or headache.     [provider]  cetirizine (ZYRTEC) 10 MG tablet Take 10 mg by mouth daily as needed for allergies.    [provider]  loratadine (CLARITIN) 10 MG tablet Take 10 mg by mouth daily.    [provider]  tamoxifen (NOLVADEX) 20 MG tablet Take 1 tablet (20 mg total) by mouth daily. 07/18/20   [name], MD  triamcinolone ointment (KENALOG) 0.5 %  Apply 1 application topically 2 (two) times daily for 7 days. 11/23/20 11/30/20  Hazel Sams, PA-C    Family History Family History  Problem Relation Age of Onset  . Hypertension Mother   . Diabetes Mother   . Breast cancer Mother        dx. 15s  . Hypertension Father   . Colon cancer Paternal Aunt        dx. 51s  . Breast cancer Maternal Grandmother        bilateral  . Cervical cancer Maternal Grandmother        tumor on cervix  . Prostate cancer Maternal Uncle        dx. 4s  . Breast cancer Cousin        dx. late 39s  . Breast cancer Cousin        dx. <50  . Pancreatic cancer Neg Hx   . Endometrial cancer Neg Hx     Social History Social History   Tobacco Use  . Smoking status: Former Smoker    Packs/day: 0.10    Types: Cigarettes  . Smokeless tobacco: Never Used  . Tobacco comment: marijuana   Vaping Use  . Vaping Use: Never used  Substance Use Topics  . Alcohol use: Yes    Comment: socailly  . Drug use: Yes    Types: Marijuana     Allergies   Penicillins   Review of Systems Review of Systems  Skin: Positive for rash.  All other systems reviewed and are negative.    Physical Exam Triage Vital Signs ED Triage Vitals [11/23/20 1739]  Enc Vitals Group     BP (!) 141/100     Pulse Rate 88     Resp 16     Temp 98.9 F (37.2 C)     Temp Source Oral     SpO2 100 %     Weight      Height      Head Circumference      Peak Flow      Pain Score 9     Pain Loc      Pain Edu?      Excl. in [place]?    No data found.  Updated Vital Signs BP (!) 141/100 (BP Location: Left Arm)   Pulse 88   Temp 98.9 F (37.2 C) (Oral)   Resp 16   SpO2 100%   Visual Acuity Right Eye Distance:   Left Eye Distance:   Bilateral Distance:    Right Eye Near:   Left Eye Near:    Bilateral Near:     Physical Exam Vitals reviewed.  Constitutional:      General: She is not in acute distress.    Appearance: Normal appearance. She is not ill-appearing or diaphoretic.  HENT:     Head: Normocephalic and atraumatic.  Cardiovascular:     Rate and Rhythm: Normal rate and regular rhythm.     Heart sounds: Normal heart sounds.  Pulmonary:     Effort: Pulmonary effort is normal.     Breath sounds: Normal breath sounds.  Skin:    General: Skin is warm.     Capillary Refill: Capillary refill takes less than 2 seconds.     Comments: Dorsum bilateral hands with faint erythema. No lesions, warmth, discharge. Radial pulse 2+, cap refill <2 seconds.  Neurological:     General: No focal deficit present.     Mental Status: She is alert and oriented  to person, place, and time.  Psychiatric:        Mood and Affect: Mood normal.        Behavior: Behavior normal.        Thought Content: Thought content normal.        Judgment: Judgment normal.      UC Treatments / Results  Labs (all  labs ordered are listed, but only abnormal results are displayed) Labs Reviewed - No data to display  EKG   Radiology No results found.  Procedures Procedures (including critical care time)  Medications Ordered in UC Medications - No data to display  Initial Impression / Assessment and Plan / UC Course  I have reviewed the triage vital signs and the nursing notes.  Pertinent labs & imaging results that were available during my care of the patient were reviewed by me and considered in my medical decision making (see chart for details).     This patient is a 50 year old female presenting with irritant dermatitis of bilateral hands following wearing gloves at work.  Triamcinolone ointment sent as below, recommended thick emollient cream like Aquaphor.  ED return precautions discussed. Afebrile, nontachy.  Final Clinical Impressions(s) / UC Diagnoses   Final diagnoses:  Irritant hand dermatitis     Discharge Instructions     -Apply triamcinolone ointment to hands twice daily for 7 days.  Let this absorb for about 15 minutes and then you can apply a thick moisturizing cream like Aquaphor or CeraVe. -Experiment with different gloves and hand sanitizer at work.  You may find that you need to wash your hands instead of using hand sanitizer, or use a different type of glove.   ED Prescriptions    Medication Sig Dispense Auth. Provider   triamcinolone ointment (KENALOG) 0.5 %  (Status: Discontinued) Apply 1 application topically 2 (two) times daily. 30 g Hazel Sams, PA-C   triamcinolone ointment (KENALOG) 0.5 % Apply 1 application topically 2 (two) times daily for 7 days. 30 g Hazel Sams, PA-C     PDMP not reviewed this encounter.   Hazel Sams, PA-C 11/23/20 1843

## 2020-11-23 NOTE — Discharge Instructions (Addendum)
-  Apply triamcinolone ointment to hands twice daily for 7 days.  Let this absorb for about 15 minutes and then you can apply a thick moisturizing cream like Aquaphor or CeraVe. -Experiment with different gloves and hand sanitizer at work.  You may find that you need to wash your hands instead of using hand sanitizer, or use a different type of glove.

## 2020-11-23 NOTE — ED Triage Notes (Signed)
Pt present rash in between her fingers from wearing gloves(powder free) pt thinks she is allergic to latex gloves. The rash is painful and itching with throbbing.

## 2020-12-01 NOTE — Progress Notes (Signed)
Patient Care Team: Patient, [name] (Inactive) as PCP - General (General Practice) Gina Kaufmann, RN as Oncology Nurse Navigator Gina Germany, RN as Oncology Nurse Navigator Gina Bookbinder, MD as Consulting Physician (General Surgery) Gina Lose, MD as Consulting Physician (Hematology and Oncology) Gina Pray, MD as Consulting Physician (Radiation Oncology)  DIAGNOSIS:    ICD-10-CM   1. Malignant neoplasm of overlapping sites of right breast in female, estrogen receptor positive ([place])  C50.811    Z17.0     SUMMARY OF ONCOLOGIC HISTORY: Oncology History  Malignant neoplasm of overlapping sites of right breast in female, estrogen receptor positive ([place])  08/17/2019 Initial Diagnosis   Patient palpated a right breast mass and right axilla mass and noted skin redness x2 weeks. She went to the ED and had an abscess drained with no improvement. Diagnostic mammogram and US showed a 7.3cm right breast mass extending into all 4 quadrants and partially into overlying skin with 12 abnormal right axillary lymph nodes. Biopsy  showed IDC with necrosis in the breast and axilla, grade 3, HER-2 equivocal by IHC, negative by FISH, ER+ 40% weak, PR -, Ki67 90%.    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 The report date is 09/03/2019.  The Common Hereditary Cancers Panel offered by [facility] includes sequencing and/or deletion duplication testing of the following 48 genes: APC, ATM, AXIN2, BARD1, BMPR1A, BRCA1, BRCA2, BRIP1, CDH1, CDK4, CDKN2A (p14ARF), CDKN2A (p16INK4a), CHEK2, CTNNA1, DICER1, EPCAM (Deletion/duplication testing only), GREM1 (promoter region deletion/duplication testing only), KIT, MEN1, MLH1, MSH2, MSH3, MSH6, MUTYH, NBN, NF1, NHTL1, PALB2, PDGFRA, PMS2, POLD1, POLE, PTEN, RAD50, RAD51C, RAD51D, RNF43, SDHB, SDHC, SDHD, SMAD4, SMARCA4.  STK11, TP53, TSC1, TSC2, and VHL.  The following genes were evaluated for sequence changes only: SDHA and HOXB13 c.251G>A variant only.    09/04/2019 - 12/18/2019 Neo-Adjuvant Chemotherapy   Adriamycin and Cytoxan x 4 (09/04/2019 - 10/16/2019 Taxol x 8 (10/30/2019 - 12/18/2019), stopped for neuropathy   01/24/2020 Surgery   Right mastectomy Gina Ross) [phone]): no residual carcinoma, 5 lymph nodes negative for carcinoma.   03/25/2020 - 05/05/2020 Radiation Therapy   The patient initially received a dose of 50 Gy in 25 fractions to the breast and right supraclavicular area using whole-breast tangent fields. This was delivered using a 3-D conformal technique. The pt received a boost delivering an additional 10 Gy in 5 fractions using a electron boost with 20mV electrons. The total dose was 60 Gy.   06/2020 -  Anti-estrogen oral therapy   Tamoxifen. 1/2 tablet daily for a month, then increase to 20 mg daily.     CHIEF COMPLIANT: Follow-up of right breast cancer on tamoxifen   INTERVAL HISTORY: Gina MCROYis a 50y.o. with above-mentioned history of right breast cancerwho completed neoadjuvant chemotherapy, underwent a right mastectomy, radiation, and is currently on antiestrogen therapy with tamoxifen.She presents to the clinic todayfor follow-up.   ALLERGIES:  is allergic to penicillins.  MEDICATIONS:  Current Outpatient Medications  Medication Sig Dispense Refill  . acetaminophen (TYLENOL) 325 MG tablet Take 650 mg by mouth every 6 (six) hours as needed for moderate pain or headache.     . cetirizine (ZYRTEC) 10 MG tablet Take 10 mg by mouth daily as needed for allergies.    .Marland Kitchenloratadine (CLARITIN) 10 MG tablet Take 10 mg by mouth daily.    . tamoxifen (NOLVADEX)  20 MG tablet Take 1 tablet (20 mg total) by mouth daily. 90 tablet 3   No current facility-administered medications for this visit.    PHYSICAL EXAMINATION: ECOG PERFORMANCE STATUS: 1 - Symptomatic but completely  ambulatory  There were no vitals filed for this visit. There were no vitals filed for this visit.  BREAST: No palpable masses or nodules in either right or left breasts. No palpable axillary supraclavicular or infraclavicular adenopathy no breast tenderness or nipple discharge. (exam performed in the presence of a chaperone)  LABORATORY DATA:  I have reviewed the data as listed CMP Latest Ref Rng & Units 01/17/2020 12/18/2019 12/11/2019  Glucose 70 - 99 mg/dL 94 90 101(H)  BUN 6 - 20 mg/dL _0 Creatinine 0.44 - 1.00 mg/dL 0.98 0.88 0.82  Sodium 135 - 145 mmol/L 141 140 140  Potassium 3.5 - 5.1 mmol/L 3.8 3.8 4.2  Chloride 98 - 111 mmol/L 107 107 108  CO2 22 - 32 mmol/L _1 Calcium 8.9 - 10.3 mg/dL 9.3 9.2 9.1  Total Protein 6.5 - 8.1 g/dL 7.0 7.2 6.6  Total Bilirubin 0.3 - 1.2 mg/dL 0.6 0.3 0.3  Alkaline Phos 38 - 126 U/L 58 65 59  AST 15 - 41 U/L 37 31 45(H)  ALT 0 - 44 U/L 52(H) 33 50(H)    Lab Results  Component Value Date   WBC 3.4 (L) 01/17/2020   HGB 13.1 01/17/2020   HCT 39.3 01/17/2020   MCV 93.6 01/17/2020   PLT 254 01/17/2020   NEUTROABS 1.0 (L) 12/18/2019    ASSESSMENT & PLAN:  Malignant neoplasm of overlapping sites of right breast in female, estrogen receptor positive ([place]) 08/17/19:Patient palpated a right breast mass and right axilla mass and noted skin redness x2 weeks. She went to the ED and had an abscess drained with no improvement. Diagnostic mammogram and US showed a 7.3cm right breast mass extending into all 4 quadrants and partially into overlying skin with 12 abnormal right axillary lymph nodes. 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  Treatment plan: 1. Neoadjuvant chemotherapy with Adriamycin and Cytoxan dose dense 4 followed byTaxolweekly 8stopped after cycle 8 for neuropathy 2. 01/24/2020: Right mastectomy: No residual cancer  identified, 0/5 lymph nodes negative treatment effect in the breast and the lymph nodes. ER 40% weak staining, PR negative, HER-2 negative, Ki-67 90%, complete pathologic response 3. Followed by adjuvant radiation therapy completed 05/05/2020 4.Follow-up adjuvant antiestrogen therapy  CT CAP 08/31/2019: Very large central right breast mass, bulky right axillary and subpectoral lymph nodes. No evidence of distant metastatic disease. Bulky uterine fibroids Bone scan 08/31/2019: No metastatic disease in the bones ---------------------------------------------------------------------------------------------------------------------------------------------------- Current Treatment: Tamoxifen 20 mg daily started 06/29/19.  Tamoxifen Toxicities: Tolerating it well without any major problems.  Does complain of hot flashes but they are not unbearable. Denies any peripheral neuropathy at this time.  Breast cancer Surveillance: 1. Breast Exam: 12/01/20: Benign 2. Mammogram: Will need to be ordered. 3.  Because she is BRCA1 mutation positive: She will need a breast MRI every year.  We will set this up for December.  She plans to undergo reconstruction on the right chest later in the year.  She sees Dr. Iran Planas.  She is planning to undergo hysterectomy and bilateral salpingo-oophorectomy with Dr. Berline Lopes later this year as well.  RTC in 1 year    No  orders of the defined types were placed in this encounter.  The patient has a good understanding of the overall plan. she agrees with it. she will call with any problems that may develop before the next visit here.  Total time spent: 20 mins including face to face time and time spent for planning, charting and coordination of care  Rulon Eisenmenger, MD, MPH 12/02/2020  I, Cloyde Reams Dorshimer, am acting as scribe for Dr. Nicholas Ross.  I have reviewed the above documentation for accuracy and completeness, and I agree with the above.

## 2020-12-01 NOTE — Assessment & Plan Note (Signed)
08/17/19:Patient palpated a right breast mass and right axilla mass and noted skin redness x2 weeks. She went to the ED and had an abscess drained with no improvement. Diagnostic mammogram and US showed a 7.3cm right breast mass extending into all 4 quadrants and partially into overlying skin with 12 abnormal right axillary lymph nodes. 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  Treatment plan: 1. Neoadjuvant chemotherapy with Adriamycin and Cytoxan dose dense 4 followed byTaxolweekly 8stopped after cycle 8 for neuropathy 2. 01/24/2020: Right mastectomy: No residual cancer identified, 0/5 lymph nodes negative treatment effect in the breast and the lymph nodes. ER 40% weak staining, PR negative, HER-2 negative, Ki-67 90%, complete pathologic response 3. Followed by adjuvant radiation therapy completed 05/05/2020 4.Follow-up adjuvant antiestrogen therapy  CT CAP 08/31/2019: Very large central right breast mass, bulky right axillary and subpectoral lymph nodes. No evidence of distant metastatic disease. Bulky uterine fibroids Bone scan 08/31/2019: No metastatic disease in the bones ---------------------------------------------------------------------------------------------------------------------------------------------------- Current Treatment: Tamoxifen 20 mg daily started 06/29/19.  Tamoxifen Toxicities:  Breast cancer Surveillance: 1. Breast Exam: 12/01/20: Benign 2. Mammogram:   RTC in 1 year

## 2020-12-02 ENCOUNTER — Inpatient Hospital Stay: Payer: Medicaid Other | Attending: Hematology and Oncology | Admitting: Hematology and Oncology

## 2020-12-02 ENCOUNTER — Other Ambulatory Visit: Payer: Self-pay

## 2020-12-02 DIAGNOSIS — Z9221 Personal history of antineoplastic chemotherapy: Secondary | ICD-10-CM | POA: Insufficient documentation

## 2020-12-02 DIAGNOSIS — C50811 Malignant neoplasm of overlapping sites of right female breast: Secondary | ICD-10-CM | POA: Diagnosis not present

## 2020-12-02 DIAGNOSIS — Z170 Estrogen receptor positive status [ER+]: Secondary | ICD-10-CM | POA: Diagnosis not present

## 2020-12-02 DIAGNOSIS — Z923 Personal history of irradiation: Secondary | ICD-10-CM | POA: Diagnosis not present

## 2020-12-02 DIAGNOSIS — Z9011 Acquired absence of right breast and nipple: Secondary | ICD-10-CM | POA: Insufficient documentation

## 2021-01-20 ENCOUNTER — Ambulatory Visit
Admission: RE | Admit: 2021-01-20 | Discharge: 2021-01-20 | Disposition: A | Discharge status: Home / Self Care | Payer: Medicaid Other | Source: Ambulatory Visit | Attending: Hematology and Oncology | Admitting: Hematology and Oncology

## 2021-01-20 ENCOUNTER — Other Ambulatory Visit: Payer: Self-pay

## 2021-01-20 DIAGNOSIS — Z170 Estrogen receptor positive status [ER+]: Secondary | ICD-10-CM

## 2021-01-20 DIAGNOSIS — C50811 Malignant neoplasm of overlapping sites of right female breast: Secondary | ICD-10-CM

## 2021-01-20 HISTORY — DX: Personal history of irradiation: Z92.3

## 2021-01-20 HISTORY — DX: Personal history of antineoplastic chemotherapy: Z92.21

## 2021-04-27 ENCOUNTER — Telehealth: Payer: Self-pay | Admitting: *Deleted

## 2021-04-27 NOTE — Telephone Encounter (Signed)
Received call from pt with complaint of increase in abdominal gas and flatulence.  Pt states she has increased fiber in her diet lately.  Per MD pt to take OTC gas-x as directed to help with symptoms.  Pt verbalized understanding.

## 2021-06-03 ENCOUNTER — Other Ambulatory Visit: Payer: Medicaid Other

## 2021-06-15 ENCOUNTER — Other Ambulatory Visit: Payer: Self-pay

## 2021-06-15 ENCOUNTER — Encounter (HOSPITAL_COMMUNITY): Payer: Self-pay | Admitting: Emergency Medicine

## 2021-06-15 ENCOUNTER — Ambulatory Visit (HOSPITAL_COMMUNITY)
Admission: EM | Admit: 2021-06-15 | Discharge: 2021-06-15 | Disposition: A | Discharge status: Home / Self Care | Payer: Medicaid Other | Attending: Internal Medicine | Admitting: Internal Medicine

## 2021-06-15 DIAGNOSIS — Z20822 Contact with and (suspected) exposure to covid-19: Secondary | ICD-10-CM | POA: Diagnosis not present

## 2021-06-15 DIAGNOSIS — J040 Acute laryngitis: Secondary | ICD-10-CM | POA: Insufficient documentation

## 2021-06-15 DIAGNOSIS — B9789 Other viral agents as the cause of diseases classified elsewhere: Secondary | ICD-10-CM | POA: Insufficient documentation

## 2021-06-15 DIAGNOSIS — Z853 Personal history of malignant neoplasm of breast: Secondary | ICD-10-CM | POA: Insufficient documentation

## 2021-06-15 DIAGNOSIS — Z79899 Other long term (current) drug therapy: Secondary | ICD-10-CM | POA: Insufficient documentation

## 2021-06-15 MED ORDER — BENZONATATE 100 MG PO CAPS: 100.0000 mg | ORAL_CAPSULE | Freq: Three times a day (TID) | ORAL | 0 refills | Status: DC | PRN

## 2021-06-15 NOTE — ED Provider Notes (Signed)
[place]    CSN: [id number] Arrival date & time: 06/15/21  9977      History   Chief Complaint Chief Complaint  Patient presents with   Cough    HPI Gina Ross is a 50 y.o. female with a history of breast CA stage III currently on medications comes to urgent care with 3-day history of cough, sore throat and hoarseness of her voice.  Patient's symptoms started insidiously and has been persistent.  She denies any fever or chills.  No pain on swallowing.  No sick contacts.  Patient is fully vaccinated against COVID-19 virus.  No nausea, vomiting or diarrhea. Marland Kitchen   HPI  Past Medical History:  Diagnosis Date   Asthma    as a child   BRCA1 gene mutation positive in female    Breast cancer [facility])    Family history of breast cancer    Family history of colon cancer    Family history of prostate cancer    History of radiation therapy 03/25/2020-05/05/2020   IMRT to right breast     Dr [name]   Personal history of chemotherapy    Personal history of radiation therapy     Patient Active Problem List   Diagnosis Date Noted   Breast cancer, right ([place]) 01/24/2020   Port-A-Cath in place 09/18/2019   BRCA1 gene mutation positive in female 09/05/2019   Genetic testing 09/05/2019   Family history of breast cancer    Family history of prostate cancer    Family history of colon cancer    Malignant neoplasm of overlapping sites of right breast in female, estrogen receptor positive ([place]) 08/17/2019   Unilateral primary osteoarthritis, right knee 01/31/2017    Past Surgical History:  Procedure Laterality Date   COLONOSCOPY     HARDWARE REMOVAL Right 09/11/2014   Procedure: Removal Deep Hardware Right Knee;  Surgeon: Newt Minion, MD;  Location: [place];  Service: Orthopedics;  Laterality: Right;   IR IMAGING GUIDED PORT INSERTION  09/04/2019   MASTECTOMY WITH AXILLARY LYMPH NODE DISSECTION Right 01/24/2020   Procedure: RIGHT MASTECTOMY WITH AXILLARY LYMPH NODE  DISSECTION;  Surgeon: [name], MD;  Location: [place];  Service: General;  Laterality: Right;  PEC BLOCK   ORIF PATELLA Right 03/22/2014   Procedure: Open Reduction Internal Fixation Right Patella ;  Surgeon: Newt Minion, MD;  Location: [place];  Service: Orthopedics;  Laterality: Right;    OB History     Gravida  1   Para  1   Term      Preterm      AB      Living         SAB      IAB      Ectopic      Multiple      Live Births               Home Medications    Prior to Admission medications   Medication Sig Start Date End Date Taking? Authorizing Provider  benzonatate (TESSALON) 100 MG capsule Take 1 capsule (100 mg total) by mouth 3 (three) times daily as needed for cough. 06/15/21  Yes Raynette Arras, Myrene Galas, MD  acetaminophen (TYLENOL) 325 MG tablet Take 650 mg by mouth every 6 (six) hours as needed for moderate pain or headache.     [provider]  cetirizine (ZYRTEC) 10 MG tablet Take 10 mg by mouth daily as needed for allergies.  [provider]  loratadine (CLARITIN) 10 MG tablet Take 10 mg by mouth daily.    [provider]  tamoxifen (NOLVADEX) 20 MG tablet Take 1 tablet (20 mg total) by mouth daily. 07/18/20   [name], MD    Family History Family History  Problem Relation Age of Onset   Hypertension Mother    Diabetes Mother    Breast cancer Mother        dx. 43s   Hypertension Father    Colon cancer Paternal Aunt        dx. 69s   Breast cancer Maternal Grandmother        bilateral   Cervical cancer Maternal Grandmother        tumor on cervix   Prostate cancer Maternal Uncle        dx. 65s   Breast cancer Cousin        dx. late 72s   Breast cancer Cousin        dx. <50   Pancreatic cancer Neg Hx    Endometrial cancer Neg Hx     Social History Social History   Tobacco Use   Smoking status: Former    Packs/day: 0.10    Types: Cigarettes   Smokeless tobacco: Never   Tobacco comments:     marijuana  Vaping Use   Vaping Use: Never used  Substance Use Topics   Alcohol use: Yes    Comment: socailly   Drug use: Yes    Types: Marijuana     Allergies   Penicillins   Review of Systems Review of Systems  Constitutional: Negative.   HENT: Negative.    Respiratory:  Positive for cough. Negative for chest tightness, shortness of breath and wheezing.   Cardiovascular: Negative.   Gastrointestinal: Negative.   Neurological: Negative.  Negative for headaches.    Physical Exam Triage Vital Signs ED Triage Vitals  Enc Vitals Group     BP 06/15/21 1025 126/89     Pulse Rate 06/15/21 1025 92     Resp 06/15/21 1025 20     Temp 06/15/21 1025 98.4 F (36.9 C)     Temp Source 06/15/21 1025 Oral     SpO2 06/15/21 1025 97 %     Weight --      Height --      Head Circumference --      Peak Flow --      Pain Score 06/15/21 1023 0     Pain Loc --      Pain Edu? --      Excl. in [place]? --    No data found.  Updated Vital Signs BP 126/89 (BP Location: Left Arm)    Pulse 92    Temp 98.4 F (36.9 C) (Oral)    Resp 20    LMP  (LMP Unknown)    SpO2 97%   Visual Acuity Right Eye Distance:   Left Eye Distance:   Bilateral Distance:    Right Eye Near:   Left Eye Near:    Bilateral Near:     Physical Exam Vitals and nursing note reviewed.  Constitutional:      General: She is not in acute distress.    Appearance: She is not ill-appearing.  HENT:     Right Ear: Tympanic membrane normal.     Left Ear: Tympanic membrane normal.     Mouth/Throat:     Mouth: Mucous membranes are moist.     Pharynx: No  posterior oropharyngeal erythema.  Cardiovascular:     Rate and Rhythm: Normal rate and regular rhythm.     Pulses: Normal pulses.     Heart sounds: Normal heart sounds.  Pulmonary:     Effort: Pulmonary effort is normal.     Breath sounds: Normal breath sounds. No wheezing.  Musculoskeletal:        General: Normal range of motion.  Neurological:     Mental Status:  She is alert.     UC Treatments / Results  Labs (all labs ordered are listed, but only abnormal results are displayed) Labs Reviewed  SARS CORONAVIRUS 2 (TAT 6-24 HRS)    EKG   Radiology No results found.  Procedures Procedures (including critical care time)  Medications Ordered in UC Medications - No data to display  Initial Impression / Assessment and Plan / UC Course  I have reviewed the triage vital signs and the nursing notes.  Pertinent labs & imaging results that were available during my care of the patient were reviewed by me and considered in my medical decision making (see chart for details).     1.  Acute viral laryngitis: Tessalon Perles as needed for cough Maintain adequate hydration Humidifier use is recommended Return to urgent care if symptoms worsen COVID-19 PCR test has been sent If COVID-19 test is positive patient will benefit from antivirals. Final Clinical Impressions(s) / UC Diagnoses   Final diagnoses:  Viral laryngitis     Discharge Instructions      Please take medications as prescribed Maintain adequate hydration If COVID-19 test is positive you will benefit from antiviral agents. We will call you with recommendations if labs are abnormal   ED Prescriptions     Medication Sig Dispense Auth. Provider   benzonatate (TESSALON) 100 MG capsule Take 1 capsule (100 mg total) by mouth 3 (three) times daily as needed for cough. 30 capsule Jentry Mcqueary, Myrene Galas, MD      PDMP not reviewed this encounter.   Chase Picket, MD 06/15/21 [phone]

## 2021-06-15 NOTE — ED Triage Notes (Signed)
Laryngitis and cough that started Friday.  Cough interupts sleep

## 2021-06-15 NOTE — ED Notes (Signed)
Nasal swab in lab 

## 2021-06-15 NOTE — Discharge Instructions (Addendum)
Please take medications as prescribed Maintain adequate hydration If COVID-19 test is positive you will benefit from antiviral agents. We will call you with recommendations if labs are abnormal

## 2021-06-16 LAB — SARS CORONAVIRUS 2 (TAT 6-24 HRS): SARS Coronavirus 2: NEGATIVE

## 2021-06-22 ENCOUNTER — Other Ambulatory Visit: Payer: Medicaid Other

## 2021-07-06 ENCOUNTER — Encounter: Payer: Medicaid Other | Admitting: Adult Health

## 2021-07-08 ENCOUNTER — Other Ambulatory Visit: Payer: Medicaid Other

## 2021-07-21 ENCOUNTER — Ambulatory Visit
Admission: RE | Admit: 2021-07-21 | Discharge: 2021-07-21 | Disposition: A | Discharge status: Home / Self Care | Payer: Medicaid Other | Source: Ambulatory Visit | Attending: Hematology and Oncology | Admitting: Hematology and Oncology

## 2021-07-21 ENCOUNTER — Other Ambulatory Visit: Payer: Self-pay

## 2021-07-21 DIAGNOSIS — C50811 Malignant neoplasm of overlapping sites of right female breast: Secondary | ICD-10-CM

## 2021-07-22 ENCOUNTER — Other Ambulatory Visit: Payer: Self-pay | Admitting: Hematology and Oncology

## 2021-07-22 DIAGNOSIS — C50811 Malignant neoplasm of overlapping sites of right female breast: Secondary | ICD-10-CM

## 2021-07-31 ENCOUNTER — Telehealth: Payer: Self-pay | Admitting: *Deleted

## 2021-07-31 NOTE — Telephone Encounter (Signed)
Spoke with the patient and scheduled a follow up appt with Dr Berline Lopes on June 30th at 2:15 pm

## 2021-08-08 ENCOUNTER — Inpatient Hospital Stay: Admission: RE | Admit: 2021-08-08 | Payer: Medicaid Other | Source: Ambulatory Visit

## 2021-08-27 ENCOUNTER — Other Ambulatory Visit: Payer: Self-pay | Admitting: *Deleted

## 2021-08-27 ENCOUNTER — Ambulatory Visit
Admission: RE | Admit: 2021-08-27 | Discharge: 2021-08-27 | Disposition: A | Discharge status: Home / Self Care | Payer: Medicaid Other | Source: Ambulatory Visit | Attending: Hematology and Oncology | Admitting: Hematology and Oncology

## 2021-08-27 DIAGNOSIS — C50811 Malignant neoplasm of overlapping sites of right female breast: Secondary | ICD-10-CM

## 2021-08-27 DIAGNOSIS — R928 Other abnormal and inconclusive findings on diagnostic imaging of breast: Secondary | ICD-10-CM

## 2021-08-27 MED ORDER — GADOBUTROL 1 MMOL/ML IV SOLN
7.0000 mL | Freq: Once | INTRAVENOUS | Status: AC | PRN
  Administered 2021-08-27: 7 mL via INTRAVENOUS

## 2021-08-28 ENCOUNTER — Other Ambulatory Visit: Payer: Self-pay | Admitting: Hematology and Oncology

## 2021-08-28 DIAGNOSIS — R928 Other abnormal and inconclusive findings on diagnostic imaging of breast: Secondary | ICD-10-CM

## 2021-09-01 ENCOUNTER — Telehealth: Payer: Self-pay

## 2021-09-01 NOTE — Telephone Encounter (Signed)
Pt called, tearful, voicing concerns about bx scheduled for 3/8. Pt asks how big it is; explained to pt dimensions per scan and compared to the size of a small pearl. Pt verbalized thanks and states she feels reassured.  ?

## 2021-09-02 ENCOUNTER — Ambulatory Visit: Admission: RE | Admit: 2021-09-02 | Payer: Medicaid Other | Source: Ambulatory Visit

## 2021-09-02 ENCOUNTER — Ambulatory Visit
Admission: RE | Admit: 2021-09-02 | Discharge: 2021-09-02 | Disposition: A | Discharge status: Home / Self Care | Payer: Medicaid Other | Source: Ambulatory Visit | Attending: Hematology and Oncology | Admitting: Hematology and Oncology

## 2021-09-02 ENCOUNTER — Other Ambulatory Visit: Payer: Self-pay | Admitting: Hematology and Oncology

## 2021-09-02 ENCOUNTER — Other Ambulatory Visit: Payer: Self-pay

## 2021-09-02 ENCOUNTER — Encounter: Payer: Self-pay | Admitting: Hematology and Oncology

## 2021-09-02 DIAGNOSIS — R928 Other abnormal and inconclusive findings on diagnostic imaging of breast: Secondary | ICD-10-CM

## 2021-09-02 MED ORDER — GADOBUTROL 1 MMOL/ML IV SOLN
5.0000 mL | Freq: Once | INTRAVENOUS | Status: AC | PRN
  Administered 2021-09-02: 5 mL via INTRAVENOUS

## 2021-09-03 ENCOUNTER — Other Ambulatory Visit: Payer: Self-pay | Admitting: *Deleted

## 2021-09-03 DIAGNOSIS — C50811 Malignant neoplasm of overlapping sites of right female breast: Secondary | ICD-10-CM

## 2021-09-08 ENCOUNTER — Encounter: Payer: Self-pay | Admitting: *Deleted

## 2021-09-24 ENCOUNTER — Telehealth: Payer: Self-pay

## 2021-09-24 ENCOUNTER — Other Ambulatory Visit: Payer: Self-pay | Admitting: Hematology and Oncology

## 2021-09-24 NOTE — Telephone Encounter (Signed)
Pt called and LVM asking for a call back, no detailed message. Attempted to call pt back, no answer, LVM for CB.  ?

## 2021-09-30 ENCOUNTER — Telehealth: Payer: Self-pay | Admitting: Hematology and Oncology

## 2021-09-30 NOTE — Telephone Encounter (Signed)
.  Called patient to schedule appointment per 3/9 inbasket, patient is aware of date and time.   ?

## 2021-10-28 ENCOUNTER — Telehealth: Payer: Self-pay | Admitting: *Deleted

## 2021-10-28 NOTE — Telephone Encounter (Signed)
Received call from pt with complaint of right sided chest swelling at the location of previous right sided mastectomy x1 week.  Pt denies recent injury, trauma, or drainage. Pt states she will be going to Urgent Care for further evaluation. MD agreeable with pt decision.  Pt educated to contact our office if she needs to be seen by one of our providers.  Pt verbalized understanding.  ?

## 2021-12-02 ENCOUNTER — Ambulatory Visit: Payer: Medicaid Other | Admitting: Hematology and Oncology

## 2021-12-23 ENCOUNTER — Encounter: Payer: Self-pay | Admitting: Gynecologic Oncology

## 2021-12-25 ENCOUNTER — Other Ambulatory Visit: Payer: Self-pay

## 2021-12-25 ENCOUNTER — Inpatient Hospital Stay: Payer: Medicaid Other | Attending: Gynecologic Oncology | Admitting: Gynecologic Oncology

## 2021-12-25 ENCOUNTER — Inpatient Hospital Stay: Payer: Medicaid Other

## 2021-12-25 ENCOUNTER — Ambulatory Visit: Payer: Medicaid Other | Admitting: Gynecologic Oncology

## 2021-12-25 ENCOUNTER — Encounter: Payer: Self-pay | Admitting: Gynecologic Oncology

## 2021-12-25 VITALS — BP 135/96 | HR 89 | Temp 97.2°F | Resp 17 | Wt 168.3 lb

## 2021-12-25 DIAGNOSIS — Z1502 Genetic susceptibility to malignant neoplasm of ovary: Secondary | ICD-10-CM

## 2021-12-25 DIAGNOSIS — Z170 Estrogen receptor positive status [ER+]: Secondary | ICD-10-CM | POA: Diagnosis not present

## 2021-12-25 DIAGNOSIS — C50811 Malignant neoplasm of overlapping sites of right female breast: Secondary | ICD-10-CM | POA: Diagnosis not present

## 2021-12-25 DIAGNOSIS — Z79810 Long term (current) use of selective estrogen receptor modulators (SERMs): Secondary | ICD-10-CM | POA: Diagnosis not present

## 2021-12-25 DIAGNOSIS — Z1501 Genetic susceptibility to malignant neoplasm of breast: Secondary | ICD-10-CM

## 2021-12-25 DIAGNOSIS — Z148 Genetic carrier of other disease: Secondary | ICD-10-CM | POA: Diagnosis present

## 2021-12-25 DIAGNOSIS — Z124 Encounter for screening for malignant neoplasm of cervix: Secondary | ICD-10-CM

## 2021-12-25 DIAGNOSIS — Z1509 Genetic susceptibility to other malignant neoplasm: Secondary | ICD-10-CM

## 2021-12-25 DIAGNOSIS — Z9011 Acquired absence of right breast and nipple: Secondary | ICD-10-CM | POA: Insufficient documentation

## 2021-12-25 NOTE — Patient Instructions (Signed)
It was good to see you today.  We will get the tumor marker blood test today that we got a year ago.  I have also ordered a pelvic ultrasound to take a look at your ovaries since we are delaying surgery at least another couple of months.  I will plan to see you back in about 3 months to talk about your readiness for surgery.  If something changes from a work standpoint and you would like to move forward with scheduling surgery before then, please call the clinic to let me know at [phone].

## 2021-12-25 NOTE — Progress Notes (Signed)
Gynecologic Oncology Return Clinic Visit  12/25/2021  Reason for Visit: Follow-up in the setting of BRCA1 mutation  Treatment History: Oncology History  Malignant neoplasm of overlapping sites of right breast in female, estrogen receptor positive ([place])  08/17/2019 Initial Diagnosis   Patient palpated a right breast mass and right axilla mass and noted skin redness x2 weeks. She went to the ED and had an abscess drained with no improvement. Diagnostic mammogram and US showed a 7.3cm right breast mass extending into all 4 quadrants and partially into overlying skin with 12 abnormal right axillary lymph nodes. Biopsy  showed IDC with necrosis in the breast and axilla, grade 3, HER-2 equivocal by IHC, negative by FISH, ER+ 40% weak, PR -, Ki67 90%.    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 The report date is 09/03/2019.  The Common Hereditary Cancers Panel offered by [facility] includes sequencing and/or deletion duplication testing of the following 48 genes: APC, ATM, AXIN2, BARD1, BMPR1A, BRCA1, BRCA2, BRIP1, CDH1, CDK4, CDKN2A (p14ARF), CDKN2A (p16INK4a), CHEK2, CTNNA1, DICER1, EPCAM (Deletion/duplication testing only), GREM1 (promoter region deletion/duplication testing only), KIT, MEN1, MLH1, MSH2, MSH3, MSH6, MUTYH, NBN, NF1, NHTL1, PALB2, PDGFRA, PMS2, POLD1, POLE, PTEN, RAD50, RAD51C, RAD51D, RNF43, SDHB, SDHC, SDHD, SMAD4, SMARCA4. STK11, TP53, TSC1, TSC2, and VHL.  The following genes were evaluated for sequence changes only: SDHA and HOXB13 c.251G>A variant only.    09/04/2019 - 12/18/2019 Neo-Adjuvant Chemotherapy   Adriamycin and Cytoxan x 4 (09/04/2019 - 10/16/2019 Taxol x 8 (10/30/2019 - 12/18/2019), stopped for neuropathy   01/24/2020 Surgery   Right mastectomy [name]) [phone]): no residual carcinoma, 5 lymph nodes negative for carcinoma.    03/25/2020 - 05/05/2020 Radiation Therapy   The patient initially received a dose of 50 Gy in 25 fractions to the breast and right supraclavicular area using whole-breast tangent fields. This was delivered using a 3-D conformal technique. The pt received a boost delivering an additional 10 Gy in 5 fractions using a electron boost with 4mV electrons. The total dose was 60 Gy.   06/2020 -  Anti-estrogen oral therapy   Tamoxifen. 1/2 tablet daily for a month, then increase to 20 mg daily.     Interval History: Doing well.  Is on tamoxifen currently for breast cancer, denies any side effects.  Endorses a good appetite, denies nausea or emesis.  Has occasional bloating.  Reports normal bowel and bladder function.  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  Is currently between jobs.  Past Medical/Surgical History: Past Medical History:  Diagnosis Date   Asthma    as a child   BRCA1 gene mutation positive in female    Breast cancer ([facility]    Family history of breast cancer    Family history of colon cancer    Family history of prostate cancer    History of radiation therapy 03/25/2020-05/05/2020   IMRT to right breast     Dr JGery Pray  Personal history of chemotherapy    Personal history of radiation therapy     Past Surgical History:  Procedure Laterality Date   COLONOSCOPY     HARDWARE REMOVAL Right 09/11/2014   Procedure: Removal Deep Hardware Right Knee;  Surgeon: MNewt Minion MD;  Location[place]  Service: Orthopedics;  Laterality: Right;   IR IMAGING GUIDED PORT INSERTION  09/04/2019   MASTECTOMY WITH AXILLARY  LYMPH NODE DISSECTION Right 01/24/2020   Procedure: RIGHT MASTECTOMY WITH AXILLARY LYMPH NODE DISSECTION;  Surgeon: [name], MD;  Location: [place];  Service: General;  Laterality: Right;  PEC BLOCK   ORIF PATELLA Right 03/22/2014   Procedure: Open Reduction Internal Fixation Right Patella ;  Surgeon: Newt Minion, MD;  Location: [place];  Service:  Orthopedics;  Laterality: Right;    Family History  Problem Relation Age of Onset   Hypertension Mother    Diabetes Mother    Breast cancer Mother        dx. 94s   Hypertension Father    Colon cancer Paternal Aunt        dx. 68s   Breast cancer Maternal Grandmother        bilateral   Cervical cancer Maternal Grandmother        tumor on cervix   Prostate cancer Maternal Uncle        dx. 7s   Breast cancer Cousin        dx. late 44s   Breast cancer Cousin        dx. <50   Pancreatic cancer Neg Hx    Endometrial cancer Neg Hx     Social History   Socioeconomic History   Marital status: Legally Separated    Spouse name: Not on file   Number of children: Not on file   Years of education: Not on file   Highest education level: Professional school degree (e.g., MD, DDS, DVM, JD)  Occupational History   Occupation: med tech    Comment: [employer]  Tobacco Use   Smoking status: Former    Packs/day: 0.10    Types: Cigarettes   Smokeless tobacco: Never   Tobacco comments:    marijuana  Vaping Use   Vaping Use: Never used  Substance and Sexual Activity   Alcohol use: Yes    Comment: socailly   Drug use: Yes    Types: Marijuana   Sexual activity: Not Currently    Birth control/protection: None  Other Topics Concern   Not on file  Social History Narrative   Not on file   Social Determinants of Health   Financial Resource Strain: High Risk (03/12/2020)   Overall Financial Resource Strain (CARDIA)    Difficulty of Paying Living Expenses: Very hard  Food Insecurity: Not on file  Transportation Needs: Unmet Transportation Needs (03/12/2020)   PRAPARE - Hydrologist (Medical): Yes    Lack of Transportation (Non-Medical): Yes  Physical Activity: Not on file  Stress: Not on file  Social Connections: Not on file    Current Medications:  Current Outpatient Medications:    acetaminophen (TYLENOL) 325 MG tablet, Take 650  mg by mouth every 6 (six) hours as needed for moderate pain or headache. , Disp: , Rfl:    cetirizine (ZYRTEC) 10 MG tablet, Take 10 mg by mouth daily as needed for allergies., Disp: , Rfl:    loratadine (CLARITIN) 10 MG tablet, Take 10 mg by mouth daily., Disp: , Rfl:    tamoxifen (NOLVADEX) 20 MG tablet, Take 1 tablet by mouth once daily, Disp: 90 tablet, Rfl: 0  Review of Systems: Denies appetite changes, fevers, chills, fatigue, unexplained weight changes. Denies hearing loss, neck lumps or masses, mouth sores, ringing in ears or voice changes. Denies cough or wheezing.  Denies shortness of breath. Denies chest pain or palpitations. Denies leg swelling. Denies abdominal distention, pain, blood  in stools, constipation, diarrhea, nausea, vomiting, or early satiety. Denies pain with intercourse, dysuria, frequency, hematuria or incontinence. Denies hot flashes, pelvic pain, vaginal bleeding or vaginal discharge.   Denies joint pain, back pain or muscle pain/cramps. Denies itching, rash, or wounds. Denies dizziness, headaches, numbness or seizures. Denies swollen lymph nodes or glands, denies easy bruising or bleeding. Denies anxiety, depression, confusion, or decreased concentration.  Physical Exam: BP (!) 135/96 (BP Location: Left Arm, Patient Position: Sitting, Cuff Size: Normal)   Pulse 89   Temp (!) 97.2 F (36.2 C) (Tympanic)   Resp 17   Wt 168 lb 5 oz (76.3 kg)   SpO2 100%   BMI 24.86 kg/m  General: Alert, oriented, no acute distress. HEENT: Normocephalic, atraumatic, sclera anicteric. Chest: Clear to auscultation bilaterally.  No wheezes or rhonchi. Cardiovascular: Regular rate and rhythm, no murmurs. Abdomen: soft, nontender.  Normoactive bowel sounds.  No masses or hepatosplenomegaly appreciated.   Extremities: Grossly normal range of motion.  Warm, well perfused.  No edema bilaterally. Skin: No rashes or lesions noted. Lymphatics: No cervical, supraclavicular, or  inguinal adenopathy. GU: Normal appearing external genitalia without erythema, excoriation, or lesions.  Bimanual exam reveals enlarged uterus, mobile, no parametrial involvement or nodularity.  No adnexal masses appreciated.  Laboratory & Radiologic Studies: CA-125 on 10/07/21: 11.7  Assessment & Plan: Gina Ross is a 51 y.o. woman with history of ER+ breast cancer now on anti-estrogen therapy and BRCA1 mutation.   Patient is overall doing well from a breast cancer standpoint.  Given change in jobs, she is wanting to hold off on risk-reducing surgery in the setting of her BRCA1 mutation.  We had previously spent a long time discussing role of surgery for reducing risk of ovarian/fallopian tube cancer as well as risk of uterine serous carcinoma.  Patient would like to get through job transition before scheduling surgery.  I offered that we could make an appointment towards the end of the summer to check in to see if she is ready to schedule surgery.  Also recommended, although neither is a screening test for ovarian cancer, that we obtain a CA-125 and pelvic ultrasound as a proxy for screening. Patient amenable.    I will call her with the results of these tests.  I have asked her to call if anything changes before her next scheduled visit with me.  22 minutes of total time was spent for this patient encounter, including preparation, face-to-face counseling with the patient and coordination of care, and documentation of the encounter.  Jeral Pinch, MD  Division of Gynecologic Oncology  Department of Obstetrics and Gynecology  [facility]

## 2021-12-26 LAB — CA 125: Cancer Antigen (CA) 125: 105 U/mL — ABNORMAL HIGH (ref 0.0–38.1)

## 2021-12-30 ENCOUNTER — Ambulatory Visit (HOSPITAL_COMMUNITY): Admission: RE | Admit: 2021-12-30 | Payer: Medicaid Other | Source: Ambulatory Visit

## 2021-12-31 ENCOUNTER — Ambulatory Visit (HOSPITAL_COMMUNITY)
Admission: RE | Admit: 2021-12-31 | Discharge: 2021-12-31 | Disposition: A | Discharge status: Home / Self Care | Payer: Medicaid Other | Source: Ambulatory Visit | Attending: Gynecologic Oncology | Admitting: Gynecologic Oncology

## 2021-12-31 DIAGNOSIS — Z1502 Genetic susceptibility to malignant neoplasm of ovary: Secondary | ICD-10-CM | POA: Insufficient documentation

## 2021-12-31 DIAGNOSIS — Z1509 Genetic susceptibility to other malignant neoplasm: Secondary | ICD-10-CM | POA: Insufficient documentation

## 2021-12-31 DIAGNOSIS — Z1501 Genetic susceptibility to malignant neoplasm of breast: Secondary | ICD-10-CM | POA: Insufficient documentation

## 2022-01-05 ENCOUNTER — Other Ambulatory Visit: Payer: Self-pay | Admitting: Hematology and Oncology

## 2022-01-06 ENCOUNTER — Other Ambulatory Visit: Payer: Self-pay | Admitting: Gynecologic Oncology

## 2022-01-06 ENCOUNTER — Telehealth: Payer: Self-pay

## 2022-01-06 DIAGNOSIS — Z1501 Genetic susceptibility to malignant neoplasm of breast: Secondary | ICD-10-CM

## 2022-01-06 DIAGNOSIS — Z1502 Genetic susceptibility to malignant neoplasm of ovary: Secondary | ICD-10-CM

## 2022-01-06 DIAGNOSIS — D259 Leiomyoma of uterus, unspecified: Secondary | ICD-10-CM

## 2022-01-06 NOTE — Progress Notes (Signed)
Called patient to discuss elevated CA-125 - ovaries not visualized on ultrasound. Recommended additional imaging since she is still hoping to delay risk-reducing surgery. She is amenable to this. Will order pelvic MRI.  Jeral Pinch MD Gynecologic Oncology

## 2022-01-06 NOTE — Telephone Encounter (Signed)
Pt aware of MRI appointment on 01/15/22 @ 10:00am. Arrive at 9:30am. Radiology scheduling number given to pt in case of need to reschedule.

## 2022-01-15 ENCOUNTER — Encounter (HOSPITAL_COMMUNITY): Payer: Self-pay

## 2022-01-15 ENCOUNTER — Ambulatory Visit (HOSPITAL_COMMUNITY): Admission: RE | Admit: 2022-01-15 | Payer: Medicaid Other | Source: Ambulatory Visit

## 2022-01-18 ENCOUNTER — Other Ambulatory Visit: Payer: Self-pay

## 2022-01-21 ENCOUNTER — Ambulatory Visit: Payer: Medicaid Other

## 2022-01-26 ENCOUNTER — Inpatient Hospital Stay: Payer: Medicare Other | Attending: Gynecologic Oncology | Admitting: Hematology and Oncology

## 2022-01-26 ENCOUNTER — Ambulatory Visit (HOSPITAL_COMMUNITY): Admission: RE | Admit: 2022-01-26 | Payer: Medicaid Other | Source: Ambulatory Visit

## 2022-01-26 ENCOUNTER — Other Ambulatory Visit: Payer: Self-pay

## 2022-01-26 NOTE — Assessment & Plan Note (Deleted)
08/17/19:Patient palpated a right breast mass and right axilla mass and noted skin redness x2 weeks. She went to the ED and had an abscess drained with no improvement. Diagnostic mammogram and US showed a 7.3cm right breast mass extending into all 4 quadrants and partially into overlying skin with 12 abnormal right axillary lymph nodes. 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 BRCA1 mutation  Treatment plan: 1. Neoadjuvant chemotherapy with Adriamycin and Cytoxan dose dense 4 followed byTaxolweekly 8stopped after cycle 8 for neuropathy 2. 01/24/2020: Right mastectomy: No residual cancer identified, 0/5 lymph nodes negative treatment effect in the breast and the lymph nodes. ER 40% weak staining, PR negative, HER-2 negative, Ki-67 90%, complete pathologic response 3. Followed by adjuvant radiation therapycompleted 05/05/2020 4.Follow-up adjuvant antiestrogen therapy  CT CAP 08/31/2019: Very large central right breast mass, bulky right axillary and subpectoral lymph nodes. No evidence of distant metastatic disease. Bulky uterine fibroids Bone scan 08/31/2019: No metastatic disease in the bones ---------------------------------------------------------------------------------------------------------------------------------------------------- Current Treatment: Tamoxifen 20 mg daily started 06/29/19.  Tamoxifen Toxicities: Tolerating it well without any major problems.  Does complain of hot flashes but they are not unbearable. Denies any peripheral neuropathy at this time.  Breast cancer Surveillance: 1. Breast Exam: 01/26/2022: Benign 2. Mammogram:  Left breast 01/27/2022 3.   Breast MRI 08/27/2021: Indeterminate 7 mm left breast enhancing lesion biopsy:  Pelvic MRI ordered by Dr. Berline Lopes  She plans to undergo reconstruction on the right chest later in the year.  She sees Dr. Iran Planas.  She is  planning to undergo hysterectomy and bilateral salpingo-oophorectomy with Dr. Berline Lopes later this year as well.  RTC in 1 year

## 2022-01-27 ENCOUNTER — Ambulatory Visit
Admission: RE | Admit: 2022-01-27 | Discharge: 2022-01-27 | Disposition: A | Discharge status: Home / Self Care | Payer: Medicaid Other | Source: Ambulatory Visit | Attending: Adult Health | Admitting: Adult Health

## 2022-01-27 ENCOUNTER — Telehealth: Payer: Self-pay | Admitting: *Deleted

## 2022-01-27 DIAGNOSIS — C50811 Malignant neoplasm of overlapping sites of right female breast: Secondary | ICD-10-CM

## 2022-01-27 DIAGNOSIS — Z170 Estrogen receptor positive status [ER+]: Secondary | ICD-10-CM

## 2022-01-27 NOTE — Telephone Encounter (Signed)
Called and left the patient a message to call the office back. Patient missed her MRI scan and it needs to be rescheduled

## 2022-01-28 ENCOUNTER — Encounter: Payer: Self-pay | Admitting: Hematology and Oncology

## 2022-01-28 NOTE — Telephone Encounter (Signed)
Spoke with Gina Ross and provided her with upcoming MRI appointment details. She is scheduled for 02/05/22 at 8 am at [facility]. Patient is to arrive by 7:30 am. Patient in agreement of appointment date and time.  Instructed to call with any needs.

## 2022-02-05 ENCOUNTER — Telehealth: Payer: Self-pay | Admitting: *Deleted

## 2022-02-05 ENCOUNTER — Ambulatory Visit (HOSPITAL_COMMUNITY)
Admission: RE | Admit: 2022-02-05 | Discharge: 2022-02-05 | Disposition: A | Discharge status: Home / Self Care | Payer: Medicaid Other | Source: Ambulatory Visit | Attending: Gynecologic Oncology | Admitting: Gynecologic Oncology

## 2022-02-05 ENCOUNTER — Encounter (HOSPITAL_COMMUNITY): Payer: Self-pay

## 2022-02-05 DIAGNOSIS — D259 Leiomyoma of uterus, unspecified: Secondary | ICD-10-CM

## 2022-02-05 DIAGNOSIS — Z1501 Genetic susceptibility to malignant neoplasm of breast: Secondary | ICD-10-CM

## 2022-02-05 NOTE — Telephone Encounter (Signed)
Patient called and stated "I just came from trying to the MRI done. I can't do it. I'm too claustrophobic for the test. They told me to contact our office for possible meds. I'm sorry I tried and I'm embarrassed I couldn't do the test."   Explained that there was no reason to be embarrassed. Explained that Dr Chancy Hurter APP are out of the office and the message would be given to them on Monday. Explained that the office would call her back next week. Pharmacy and med allergy reviewed with the patient.

## 2022-02-08 ENCOUNTER — Other Ambulatory Visit: Payer: Self-pay | Admitting: Gynecologic Oncology

## 2022-02-08 ENCOUNTER — Encounter: Payer: Self-pay | Admitting: Hematology and Oncology

## 2022-02-08 DIAGNOSIS — F40240 Claustrophobia: Secondary | ICD-10-CM

## 2022-02-08 MED ORDER — LORAZEPAM 0.5 MG PO TABS: 0.5000 mg | ORAL_TABLET | Freq: Once | ORAL | 0 refills | Status: AC

## 2022-02-08 NOTE — Telephone Encounter (Signed)
Told Gina Ross that [facility] sent in a prescription for ativan as noted below. She will have a driver. Told her that Sharyn Lull will call her with the r/s MRI once it is scheduled. Pt verbalized understanding.

## 2022-02-08 NOTE — Telephone Encounter (Signed)
LVM for patient to call office regarding medication  Per Joylene John NP, She sent in a one time dose of Ativan to take one hour before her MRI. Please let her know the medication may cause drowsiness and she will need someone to drive her to and from the scan.

## 2022-02-08 NOTE — Progress Notes (Signed)
See RN note. Ativan sent in for one time dose prior to MRI.

## 2022-02-12 ENCOUNTER — Other Ambulatory Visit: Payer: Self-pay | Admitting: Gynecologic Oncology

## 2022-02-12 ENCOUNTER — Telehealth: Payer: Self-pay | Admitting: *Deleted

## 2022-02-12 DIAGNOSIS — Z1501 Genetic susceptibility to malignant neoplasm of breast: Secondary | ICD-10-CM

## 2022-02-12 DIAGNOSIS — D259 Leiomyoma of uterus, unspecified: Secondary | ICD-10-CM

## 2022-02-12 DIAGNOSIS — R971 Elevated cancer antigen 125 [CA 125]: Secondary | ICD-10-CM

## 2022-02-12 NOTE — Telephone Encounter (Signed)
MRI scan scheduled and patient aware of appt date/time

## 2022-02-12 NOTE — Progress Notes (Unsigned)
Reordering MRI pelvis due to insurance authorization expiring.

## 2022-02-17 ENCOUNTER — Encounter: Payer: Self-pay | Admitting: Hematology and Oncology

## 2022-02-19 ENCOUNTER — Encounter: Payer: Self-pay | Admitting: Gynecologic Oncology

## 2022-02-19 ENCOUNTER — Telehealth: Payer: Self-pay | Admitting: *Deleted

## 2022-02-19 ENCOUNTER — Encounter (HOSPITAL_COMMUNITY): Payer: Self-pay

## 2022-02-19 ENCOUNTER — Ambulatory Visit (HOSPITAL_COMMUNITY): Admission: RE | Admit: 2022-02-19 | Payer: Medicare Other | Source: Ambulatory Visit

## 2022-02-19 NOTE — Telephone Encounter (Signed)
Patient stated that she will send a copy of her insurance card via my chart   Patient canceled her MRI due to her insurance and not feeling well

## 2022-02-22 ENCOUNTER — Telehealth: Payer: Self-pay | Admitting: *Deleted

## 2022-02-22 NOTE — Telephone Encounter (Signed)
Spoke with the patient a gave new MRI date and time

## 2022-03-03 ENCOUNTER — Ambulatory Visit (HOSPITAL_COMMUNITY): Admission: RE | Admit: 2022-03-03 | Payer: Medicare Other | Source: Ambulatory Visit

## 2022-03-07 ENCOUNTER — Ambulatory Visit (HOSPITAL_COMMUNITY)
Admission: RE | Admit: 2022-03-07 | Discharge: 2022-03-07 | Disposition: A | Discharge status: Home / Self Care | Payer: Medicare Other | Source: Ambulatory Visit | Attending: Gynecologic Oncology | Admitting: Gynecologic Oncology

## 2022-03-07 DIAGNOSIS — Z1509 Genetic susceptibility to other malignant neoplasm: Secondary | ICD-10-CM | POA: Diagnosis present

## 2022-03-07 DIAGNOSIS — R971 Elevated cancer antigen 125 [CA 125]: Secondary | ICD-10-CM

## 2022-03-07 DIAGNOSIS — D259 Leiomyoma of uterus, unspecified: Secondary | ICD-10-CM | POA: Diagnosis present

## 2022-03-07 DIAGNOSIS — Z1502 Genetic susceptibility to malignant neoplasm of ovary: Secondary | ICD-10-CM | POA: Diagnosis present

## 2022-03-07 DIAGNOSIS — Z1501 Genetic susceptibility to malignant neoplasm of breast: Secondary | ICD-10-CM

## 2022-03-07 MED ORDER — GADOBUTROL 1 MMOL/ML IV SOLN
7.5000 mL | Freq: Once | INTRAVENOUS | Status: AC | PRN
  Administered 2022-03-07: 7.5 mL via INTRAVENOUS

## 2022-03-08 ENCOUNTER — Telehealth: Payer: Self-pay | Admitting: Gynecologic Oncology

## 2022-03-08 NOTE — Telephone Encounter (Signed)
Called the patient, discussed MRI findings.  Although no definitive imaging evidence of malignancy, in the setting of her BRCA mutation, I recommend that we schedule surgery as soon as the patient is ready.  She is wanting to schedule surgery in October.  I will have my office reach out to her tomorrow to pick a date for surgery.  Plan will be for robotic hysterectomy with bilateral salpingo-oophorectomy, possible staging.  Jeral Pinch MD Gynecologic Oncology

## 2022-03-09 ENCOUNTER — Telehealth: Payer: Self-pay

## 2022-03-09 NOTE — Telephone Encounter (Signed)
Pt aware of potential surgery date being 03/25/22. She states she was hoping to get it in October (1st or 2nd week) but she will call her job and ok it with them then call us back.

## 2022-03-10 ENCOUNTER — Encounter: Payer: Self-pay | Admitting: Hematology and Oncology

## 2022-03-10 NOTE — Telephone Encounter (Signed)
Pt is scheduled for surgery with Dr. Berline Lopes on 04/01/22.  Pre-op visit 03/23/22 with Joylene John NP 1 week post op phone visit:04/06/22 4:45 3 week post op visit: 04/23/22 @ 2:15  Pt ok with dates/time

## 2022-03-10 NOTE — Telephone Encounter (Signed)
LVM for pt to call us back regarding an update on potential surgery date

## 2022-03-11 ENCOUNTER — Other Ambulatory Visit: Payer: Self-pay

## 2022-03-12 ENCOUNTER — Other Ambulatory Visit: Payer: Self-pay | Admitting: Gynecologic Oncology

## 2022-03-12 DIAGNOSIS — Z1501 Genetic susceptibility to malignant neoplasm of breast: Secondary | ICD-10-CM

## 2022-03-18 ENCOUNTER — Inpatient Hospital Stay: Payer: Medicare Other | Attending: Gynecologic Oncology | Admitting: Gynecologic Oncology

## 2022-03-18 DIAGNOSIS — D259 Leiomyoma of uterus, unspecified: Secondary | ICD-10-CM

## 2022-03-18 NOTE — Patient Instructions (Addendum)
Preparing for your Surgery  Plan for surgery on April 01, 2022 with Dr. Jeral Pinch at [place] will be scheduled for robotic assisted laparoscopic total hysterectomy (removal of the uterus and cervix), bilateral salpingo-oophorectomy (removal of both ovaries and fallopian tubes), possible staging if a cancer is identified.   Pre-operative Testing -You will receive a phone call from presurgical testing at [facility] to arrange for a pre-operative appointment and lab work.  -Bring your insurance card, copy of an advanced directive if applicable, medication list  -At that visit, you will be asked to sign a consent for a possible blood transfusion in case a transfusion becomes necessary during surgery.  The need for a blood transfusion is rare but having consent is a necessary part of your care.     -You should not be taking blood thinners or aspirin at least ten days prior to surgery unless instructed by your surgeon.  -Do not take supplements such as fish oil (omega 3), red yeast rice, turmeric before your surgery. You want to avoid medications with aspirin in them including headache powders such as BC or Goody's), Excedrin migraine.  Day Before Surgery at [place] will be asked to take in a light diet the day before surgery. You will be advised you can have clear liquids up until 3 hours before your surgery.    Eat a light diet the day before surgery.  Examples including soups, broths, toast, yogurt, mashed potatoes.  AVOID GAS PRODUCING FOODS. Things to avoid include carbonated beverages (fizzy beverages, sodas), raw fruits and raw vegetables (uncooked), or beans.   If your bowels are filled with gas, your surgeon will have difficulty visualizing your pelvic organs which increases your surgical risks.  Your role in recovery Your role is to become active as soon as directed by your doctor, while still giving yourself time to heal.  Rest when you feel tired.  You will be asked to do the following in order to speed your recovery:  - Cough and breathe deeply. This helps to clear and expand your lungs and can prevent pneumonia after surgery.  - [place]. Do mild physical activity. Walking or moving your legs help your circulation and body functions return to normal. Do not try to get up or walk alone the first time after surgery.   -If you develop swelling on one leg or the other, pain in the back of your leg, redness/warmth in one of your legs, please call the office or go to the Emergency Room to have a doppler to rule out a blood clot. For shortness of breath, chest pain-seek care in the Emergency Room as soon as possible. - Actively manage your pain. Managing your pain lets you move in comfort. We will ask you to rate your pain on a scale of zero to 10. It is your responsibility to tell your doctor or nurse where and how much you hurt so your pain can be treated.  Special Considerations -If you are diabetic, you may be placed on insulin after surgery to have closer control over your blood sugars to promote healing and recovery.  This does not mean that you will be discharged on insulin.  If applicable, your oral antidiabetics will be resumed when you are tolerating a solid diet.  -Your final pathology results from surgery should be available around one week after surgery and the results will be relayed to you when available.  -FMLA forms can  be faxed to [phone] and please allow 5-7 business days for completion.  Pain Management After Surgery -You have been prescribed your pain medication and bowel regimen medications before surgery so that you can have these available when you are discharged from the hospital. The pain medication is for use ONLY AFTER surgery and a new prescription will not be given.   -Make sure that you have Tylenol and Ibuprofen IF YOU ARE ABLE TO TAKE THESE MEDICATIONS at home to use on a regular basis  after surgery for pain control. We recommend alternating the medications every hour to six hours since they work differently and are processed in the body differently for pain relief.  -Review the attached handout on narcotic use and their risks and side effects.   Bowel Regimen -You have been prescribed Sennakot-S to take nightly to prevent constipation especially if you are taking the narcotic pain medication intermittently.  It is important to prevent constipation and drink adequate amounts of liquids. You can stop taking this medication when you are not taking pain medication and you are back on your normal bowel routine.  Risks of Surgery Risks of surgery are low but include bleeding, infection, damage to surrounding structures, re-operation, blood clots, and very rarely death.   Blood Transfusion Information (For the consent to be signed before surgery)  We will be checking your blood type before surgery so in case of emergencies, we will know what type of blood you would need.                                            WHAT IS A BLOOD TRANSFUSION?  A transfusion is the replacement of blood or some of its parts. Blood is made up of multiple cells which provide different functions. Red blood cells carry oxygen and are used for blood loss replacement. White blood cells fight against infection. Platelets control bleeding. Plasma helps clot blood. Other blood products are available for specialized needs, such as hemophilia or other clotting disorders. BEFORE THE TRANSFUSION  Who gives blood for transfusions?  You may be able to donate blood to be used at a later date on yourself (autologous donation). Relatives can be asked to donate blood. This is generally not any safer than if you have received blood from a stranger. The same precautions are taken to ensure safety when a relative's blood is donated. Healthy volunteers who are fully evaluated to make sure their blood is safe. This is  blood bank blood. Transfusion therapy is the safest it has ever been in the practice of medicine. Before blood is taken from a donor, a complete history is taken to make sure that person has no history of diseases nor engages in risky social behavior (examples are intravenous drug use or sexual activity with multiple partners). The donor's travel history is screened to minimize risk of transmitting infections, such as malaria. The donated blood is tested for signs of infectious diseases, such as HIV and hepatitis. The blood is then tested to be sure it is compatible with you in order to minimize the chance of a transfusion reaction. If you or a relative donates blood, this is often done in anticipation of surgery and is not appropriate for emergency situations. It takes many days to process the donated blood. RISKS AND COMPLICATIONS Although transfusion therapy is very safe and saves many lives, the main  dangers of transfusion include:  Getting an infectious disease. Developing a transfusion reaction. This is an allergic reaction to something in the blood you were given. Every precaution is taken to prevent this. The decision to have a blood transfusion has been considered carefully by your caregiver before blood is given. Blood is not given unless the benefits outweigh the risks.  AFTER SURGERY INSTRUCTIONS  Return to work: 4-6 weeks if applicable  Activity: 1. Be up and out of the bed during the day.  Take a nap if needed.  You may walk up steps but be careful and use the hand rail.  Stair climbing will tire you more than you think, you may need to stop part way and rest.   2. No lifting or straining for 6 weeks over 10 pounds. No pushing, pulling, straining for 6 weeks.  3. No driving for around 1 week(s).  Do not drive if you are taking narcotic pain medicine and make sure that your reaction time has returned.   4. You can shower as soon as the next day after surgery. Shower daily.  Use your  regular soap and water (not directly on the incision) and pat your incision(s) dry afterwards; don't rub.  No tub baths or submerging your body in water until cleared by your surgeon. If you have the soap that was given to you by pre-surgical testing that was used before surgery, you do not need to use it afterwards because this can irritate your incisions.   5. No sexual activity and nothing in the vagina for 8-10 weeks.  6. You may experience a small amount of clear drainage from your incisions, which is normal.  If the drainage persists, increases, or changes color please call the office.  7. Do not use creams, lotions, or ointments such as neosporin on your incisions after surgery until advised by your surgeon because they can cause removal of the dermabond glue on your incisions.    8. You may experience vaginal spotting after surgery or around the 6-8 week mark from surgery when the stitches at the top of the vagina begin to dissolve.  The spotting is normal but if you experience heavy bleeding, call our office.  9. Take Tylenol or ibuprofen first for pain if you are able to take these medications and only use narcotic pain medication for severe pain not relieved by the Tylenol or Ibuprofen.  Monitor your Tylenol intake to a max of 4,000 mg in a 24 hour period. You can alternate these medications after surgery.  Diet: 1. Low sodium Heart Healthy Diet is recommended but you are cleared to resume your normal (before surgery) diet after your procedure.  2. It is safe to use a laxative, such as Miralax or Colace, if you have difficulty moving your bowels. You have been prescribed Sennakot-S to take at bedtime every evening after surgery to keep bowel movements regular and to prevent constipation.    Wound Care: 1. Keep clean and dry.  Shower daily.  Reasons to call the Doctor: Fever - Oral temperature greater than 100.4 degrees Fahrenheit Foul-smelling vaginal discharge Difficulty  urinating Nausea and vomiting Increased pain at the site of the incision that is unrelieved with pain medicine. Difficulty breathing with or without chest pain New calf pain especially if only on one side Sudden, continuing increased vaginal bleeding with or without clots.   Contacts: For questions or concerns you should contact:  Dr. Jeral Pinch at [place], NP at  [phone]  After Hours: call [phone] and have the GYN Oncologist paged/contacted (after 5 pm or on the weekends).  Messages sent via mychart are for non-urgent matters and are not responded to after hours so for urgent needs, please call the after hours number.

## 2022-03-18 NOTE — Progress Notes (Unsigned)
Gynecologic Oncology Return Clinic Visit  03/18/22  Reason for Visit: treatment planning  Treatment History: Oncology History  Malignant neoplasm of overlapping sites of right breast in female, estrogen receptor positive ([place])  08/17/2019 Initial Diagnosis   Patient palpated a right breast mass and right axilla mass and noted skin redness x2 weeks. She went to the ED and had an abscess drained with no improvement. Diagnostic mammogram and US showed a 7.3cm right breast mass extending into all 4 quadrants and partially into overlying skin with 12 abnormal right axillary lymph nodes. Biopsy  showed IDC with necrosis in the breast and axilla, grade 3, HER-2 equivocal by IHC, negative by FISH, ER+ 40% weak, PR -, Ki67 90%.    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 The report date is 09/03/2019.  The Common Hereditary Cancers Panel offered by [facility] includes sequencing and/or deletion duplication testing of the following 48 genes: APC, ATM, AXIN2, BARD1, BMPR1A, BRCA1, BRCA2, BRIP1, CDH1, CDK4, CDKN2A (p14ARF), CDKN2A (p16INK4a), CHEK2, CTNNA1, DICER1, EPCAM (Deletion/duplication testing only), GREM1 (promoter region deletion/duplication testing only), KIT, MEN1, MLH1, MSH2, MSH3, MSH6, MUTYH, NBN, NF1, NHTL1, PALB2, PDGFRA, PMS2, POLD1, POLE, PTEN, RAD50, RAD51C, RAD51D, RNF43, SDHB, SDHC, SDHD, SMAD4, SMARCA4. STK11, TP53, TSC1, TSC2, and VHL.  The following genes were evaluated for sequence changes only: SDHA and HOXB13 c.251G>A variant only.    09/04/2019 - 12/18/2019 Neo-Adjuvant Chemotherapy   Adriamycin and Cytoxan x 4 (09/04/2019 - 10/16/2019 Taxol x 8 (10/30/2019 - 12/18/2019), stopped for neuropathy   01/24/2020 Surgery   Right mastectomy [name]) [id number]): no residual carcinoma, 5 lymph nodes negative for carcinoma.   03/25/2020 - 05/05/2020  Radiation Therapy   The patient initially received a dose of 50 Gy in 25 fractions to the breast and right supraclavicular area using whole-breast tangent fields. This was delivered using a 3-D conformal technique. The pt received a boost delivering an additional 10 Gy in 5 fractions using a electron boost with 46mV electrons. The total dose was 60 Gy.   06/2020 -  Anti-estrogen oral therapy   Tamoxifen. 1/2 tablet daily for a month, then increase to 20 mg daily.     Interval History: ***  Past Medical/Surgical History: Past Medical History:  Diagnosis Date   Asthma    as a child   BRCA1 gene mutation positive in female    Breast cancer ([facility]    Family history of breast cancer    Family history of colon cancer    Family history of prostate cancer    History of radiation therapy 03/25/2020-05/05/2020   IMRT to right breast     Dr JGery Pray  Personal history of chemotherapy    Personal history of radiation therapy     Past Surgical History:  Procedure Laterality Date   COLONOSCOPY     HARDWARE REMOVAL Right 09/11/2014   Procedure: Removal Deep Hardware Right Knee;  Surgeon: MNewt Minion MD;  [facility] [facility]  Service: Orthopedics;  Laterality: Right;   IR IMAGING GUIDED PORT INSERTION  09/04/2019   MASTECTOMY WITH AXILLARY LYMPH NODE DISSECTION Right 01/24/2020   Procedure: RIGHT MASTECTOMY WITH AXILLARY LYMPH NODE DISSECTION;  Surgeon: WRolm Bookbinder MD;  Location[facility]  Service: General;  Laterality: Right;  PEC BLOCK   ORIF PATELLA Right 03/22/2014   Procedure: Open Reduction Internal Fixation Right Patella ;  Surgeon: MNewt Minion  MD;  Location: [facility];  Service: Orthopedics;  Laterality: Right;    Family History  Problem Relation Age of Onset   Hypertension Mother    Diabetes Mother    Breast cancer Mother        dx. 8s   Hypertension Father    Colon cancer Paternal Aunt        dx. 34s   Breast cancer Maternal Grandmother        bilateral   Cervical cancer  Maternal Grandmother        tumor on cervix   Prostate cancer Maternal Uncle        dx. 16s   Breast cancer Cousin        dx. late 85s   Breast cancer Cousin        dx. <50   Pancreatic cancer Neg Hx    Endometrial cancer Neg Hx     Social History   Socioeconomic History   Marital status: Legally Separated    Spouse name: Not on file   Number of children: Not on file   Years of education: Not on file   Highest education level: Professional school degree (e.g., MD, DDS, DVM, JD)  Occupational History   Occupation: med tech    Comment: [employer]  Tobacco Use   Smoking status: Former    Packs/day: 0.10    Types: Cigarettes   Smokeless tobacco: Never   Tobacco comments:    marijuana  Vaping Use   Vaping Use: Never used  Substance and Sexual Activity   Alcohol use: Yes    Comment: socailly   Drug use: Yes    Types: Marijuana   Sexual activity: Not Currently    Birth control/protection: None  Other Topics Concern   Not on file  Social History Narrative   Not on file   Social Determinants of Health   Financial Resource Strain: High Risk (03/12/2020)   Overall Financial Resource Strain (CARDIA)    Difficulty of Paying Living Expenses: Very hard  Food Insecurity: Not on file  Transportation Needs: Unmet Transportation Needs (03/12/2020)   PRAPARE - Hydrologist (Medical): Yes    Lack of Transportation (Non-Medical): Yes  Physical Activity: Not on file  Stress: Not on file  Social Connections: Not on file    Current Medications:  Current Outpatient Medications:    acetaminophen (TYLENOL) 325 MG tablet, Take 650 mg by mouth every 6 (six) hours as needed for moderate pain or headache. , Disp: , Rfl:    cetirizine (ZYRTEC) 10 MG tablet, Take 10 mg by mouth daily as needed for allergies., Disp: , Rfl:    loratadine (CLARITIN) 10 MG tablet, Take 10 mg by mouth daily., Disp: , Rfl:    tamoxifen (NOLVADEX) 20 MG tablet, Take  1 tablet by mouth once daily, Disp: 90 tablet, Rfl: 0  Review of Systems: Denies appetite changes, fevers, chills, fatigue, unexplained weight changes. Denies hearing loss, neck lumps or masses, mouth sores, ringing in ears or voice changes. Denies cough or wheezing.  Denies shortness of breath. Denies chest pain or palpitations. Denies leg swelling. Denies abdominal distention, pain, blood in stools, constipation, diarrhea, nausea, vomiting, or early satiety. Denies pain with intercourse, dysuria, frequency, hematuria or incontinence. Denies hot flashes, pelvic pain, vaginal bleeding or vaginal discharge.   Denies joint pain, back pain or muscle pain/cramps. Denies itching, rash, or wounds. Denies dizziness, headaches, numbness or seizures. Denies swollen lymph nodes or  glands, denies easy bruising or bleeding. Denies anxiety, depression, confusion, or decreased concentration.  Physical Exam: There were no vitals taken for this visit. General: ***Alert, oriented, no acute distress. HEENT: ***Posterior oropharynx clear, sclera anicteric. Chest: ***Clear to auscultation bilaterally.  ***Port site clean. Cardiovascular: ***Regular rate and rhythm, no murmurs. Abdomen: ***Obese, soft, nontender.  Normoactive bowel sounds.  No masses or hepatosplenomegaly appreciated.  ***Well-healed scar. Extremities: ***Grossly normal range of motion.  Warm, well perfused.  No edema bilaterally. Skin: ***No rashes or lesions noted. GU: Deferred.  Laboratory & Radiologic Studies: 12/25/21: CA- 105  12/31/21: Pelvic ultrasound Nonvisualization of endometrial complex and ovaries. Multiple uterine leiomyomata, including a 4.3 cm central leiomyoma at anterior upper uterus.  03/07/22: Pelvic MRI 1. Numerous uterine fibroids. Uterine fibroids distort the endometrial cavity and obscure the junctional zone. No evidence for endometrial thickening. 2. Neither ovary can be discretely visualized by MRI today. There  is no adnexal mass. The 3. Small volume free fluid in the pelvis  Assessment & Plan: Gina Ross is a 51 y.o. woman with history of ER+ breast cancer now on anti-estrogen therapy and BRCA1 mutation.    Patient is overall doing well from a breast cancer standpoint.  Given change in jobs, she is wanting to hold off on risk-reducing surgery in the setting of her BRCA1 mutation.  We had previously spent a long time discussing role of surgery for reducing risk of ovarian/fallopian tube cancer as well as risk of uterine serous carcinoma.  Patient would like to get through job transition before scheduling surgery.  I offered that we could make an appointment towards the end of the summer to check in to see if she is ready to schedule surgery.   ***occult cancer  We discussed the plan for a robotic assisted hysterectomy, bilateral salpingo-oophorectomy, possible staging, possible laparotomy. The risks of surgery were discussed in detail and she understands these to include infection; wound separation; hernia; vaginal cuff separation, injury to adjacent organs such as bowel, bladder, blood vessels, ureters and nerves; bleeding which may require blood transfusion; anesthesia risk; thromboembolic events; possible death; unforeseen complications; possible need for re-exploration; medical complications such as heart attack, stroke, pleural effusion and pneumonia; and, if full lymphadenectomy is performed the risk of lymphedema and lymphocyst. The patient will receive DVT and antibiotic prophylaxis as indicated. She voiced a clear understanding. She had the opportunity to ask questions. Perioperative instructions were reviewed with her. Prescriptions for post-op medications were sent to her pharmacy of choice.  *** minutes of total time was spent for this patient encounter, including preparation, face-to-face counseling with the patient and coordination of care, and documentation of the encounter.  Jeral Pinch, MD  Division of Gynecologic Oncology  Department of Obstetrics and Gynecology  [facility]

## 2022-03-19 NOTE — Progress Notes (Signed)
COVID Vaccine Completed:  Yes  Date of COVID positive in last 90 days:  PCP -  Cardiologist -   Chest x-ray -  EKG -  Stress Test -  ECHO - greater than 2 years Epic Cardiac Cath -  Pacemaker/ICD device last checked: Spinal Cord Stimulator:  Bowel Prep -   Sleep Study -  CPAP -   Fasting Blood Sugar -  Checks Blood Sugar _____ times a day  Blood Thinner Instructions: Aspirin Instructions: Last Dose:  Activity level:  Can go up a flight of stairs and perform activities of daily living without stopping and without symptoms of chest pain or shortness of breath.  Able to exercise without symptoms  Unable to go up a flight of stairs without symptoms of     Anesthesia review:   Patient denies shortness of breath, fever, cough and chest pain at PAT appointment  Patient verbalized understanding of instructions that were given to them at the PAT appointment. Patient was also instructed that they will need to review over the PAT instructions again at home before surgery.

## 2022-03-19 NOTE — Patient Instructions (Signed)
SURGICAL WAITING ROOM VISITATION Patients having surgery or a procedure may have no more than 2 support people in the waiting area - these visitors may rotate.   Children under the age of 62 must have an adult with them who is not the patient. If the patient needs to stay at the hospital during part of their recovery, the visitor guidelines for inpatient rooms apply. Pre-op nurse will coordinate an appropriate time for 1 support person to accompany patient in pre-op.  This support person may not rotate.    Please refer to the [facility] website for the visitor guidelines for Inpatients (after your surgery is over and you are in a regular room).      Your procedure is scheduled on: 04-01-22   Report to [facility] Main Entrance    Report to admitting at 5:15 AM   Call this number if you have problems the morning of surgery [phone]   Follow a light diet the day before surgery (avoid gas producing foods)   Do not eat food :After Midnight.   After Midnight you may have the following liquids until 4:30 AM DAY OF SURGERY  Water Non-Citrus Juices (without pulp, NO RED) Carbonated Beverages Black Coffee (NO MILK/CREAM OR CREAMERS, sugar ok)  Clear Tea (NO MILK/CREAM OR CREAMERS, sugar ok) regular and decaf                             Plain Jell-O (NO RED)                                           Fruit ices (not with fruit pulp, NO RED)                                     Popsicles (NO RED)                                                               Sports drinks like Gatorade (NO RED)                   The day of surgery:  Drink ONE (1) Pre-Surgery Clear Ensure or G2 at 4:30 AM the morning of surgery. Drink in one sitting. Do not sip.  This drink was given to you during your hospital  pre-op appointment visit. Nothing else to drink after completing the Pre-Surgery Clear Ensure or G2.          If you have questions, please contact your surgeon's office.   FOLLOW  BOWEL PREP AND ANY ADDITIONAL PRE OP INSTRUCTIONS YOU RECEIVED FROM YOUR SURGEON'S OFFICE!!!     Oral Hygiene is also important to reduce your risk of infection.                                    Remember - BRUSH YOUR TEETH THE MORNING OF SURGERY WITH YOUR REGULAR TOOTHPASTE   Do NOT smoke after Midnight   Take these medicines the  morning of surgery with A SIP OF WATER:   DO NOT TAKE ANY ORAL DIABETIC MEDICATIONS DAY OF YOUR SURGERY  Bring CPAP mask and tubing day of surgery.                              You may not have any metal on your body including hair pins, jewelry, and body piercing             Do not wear make-up, lotions, powders, perfumes, or deodorant  Do not wear nail polish including gel and S&S, artificial/acrylic nails, or any other type of covering on natural nails including finger and toenails. If you have artificial nails, gel coating, etc. that needs to be removed by a nail salon please have this removed prior to surgery or surgery may need to be canceled/ delayed if the surgeon/ anesthesia feels like they are unable to be safely monitored.   Do not shave  48 hours prior to surgery.    Do not bring valuables to the hospital. [place].   Contacts, dentures or bridgework may not be worn into surgery.  DO NOT [place]. PHARMACY WILL DISPENSE MEDICATIONS LISTED ON YOUR MEDICATION LIST TO YOU DURING YOUR ADMISSION [place]!   Patients discharged on the day of surgery will not be allowed to drive home.  Someone NEEDS to stay with you for the first 24 hours after anesthesia.  Special Instructions: Bring a copy of your healthcare power of attorney and living will documents the day of surgery if you haven't scanned them before.  Please read over the following fact sheets you were given: IF [place]  If you received a  COVID test during your pre-op visit  it is requested that you wear a mask when out in public, stay away from anyone that may not be feeling well and notify your surgeon if you develop symptoms. If you test positive for Covid or have been in contact with anyone that has tested positive in the last 10 days please notify you surgeon.  [facility] - Preparing for Surgery Before surgery, you can play an important role.  Because skin is not sterile, your skin needs to be as free of germs as possible.  You can reduce the number of germs on your skin by washing with CHG (chlorahexidine gluconate) soap before surgery.  CHG is an antiseptic cleaner which kills germs and bonds with the skin to continue killing germs even after washing. Please DO NOT use if you have an allergy to CHG or antibacterial soaps.  If your skin becomes reddened/irritated stop using the CHG and inform your nurse when you arrive at Short Stay. Do not shave (including legs and underarms) for at least 48 hours prior to the first CHG shower.  You may shave your face/neck.  Please follow these instructions carefully:  1.  Shower with CHG Soap the night before surgery and the  morning of surgery.  2.  If you choose to wash your hair, wash your hair first as usual with your normal  shampoo.  3.  After you shampoo, rinse your hair and body thoroughly to remove the shampoo.  4.  Use CHG as you would any other liquid soap.  You can apply chg directly to the skin and wash.  Gently with a scrungie or clean washcloth.  5.  Apply the CHG Soap to your body ONLY FROM THE NECK DOWN.   Do   not use on face/ open                           Wound or open sores. Avoid contact with eyes, ears mouth and   genitals (private parts).                       Wash face,  Genitals (private parts) with your normal soap.             6.  Wash thoroughly, paying special attention to the area where your    surgery  will be performed.  7.   Thoroughly rinse your body with warm water from the neck down.  8.  DO NOT shower/wash with your normal soap after using and rinsing off the CHG Soap.                9.  Pat yourself dry with a clean towel.            10.  Wear clean pajamas.            11.  Place clean sheets on your bed the night of your first shower and do not  sleep with pets. Day of Surgery : Do not apply any lotions/deodorants the morning of surgery.  Please wear clean clothes to the hospital/surgery center.  FAILURE TO FOLLOW THESE INSTRUCTIONS MAY RESULT IN THE CANCELLATION OF YOUR SURGERY  PATIENT SIGNATURE_________________________________  NURSE SIGNATURE__________________________________  ________________________________________________________________________    WHAT IS A BLOOD TRANSFUSION? Blood Transfusion Information  A transfusion is the replacement of blood or some of its parts. Blood is made up of multiple cells which provide different functions. Red blood cells carry oxygen and are used for blood loss replacement. White blood cells fight against infection. Platelets control bleeding. Plasma helps clot blood. Other blood products are available for specialized needs, such as hemophilia or other clotting disorders. BEFORE THE TRANSFUSION  Who gives blood for transfusions?  Healthy volunteers who are fully evaluated to make sure their blood is safe. This is blood bank blood. Transfusion therapy is the safest it has ever been in the practice of medicine. Before blood is taken from a donor, a complete history is taken to make sure that person has no history of diseases nor engages in risky social behavior (examples are intravenous drug use or sexual activity with multiple partners). The donor's travel history is screened to minimize risk of transmitting infections, such as malaria. The donated blood is tested for signs of infectious diseases, such as HIV and hepatitis. The blood is then tested to be sure it is  compatible with you in order to minimize the chance of a transfusion reaction. If you or a relative donates blood, this is often done in anticipation of surgery and is not appropriate for emergency situations. It takes many days to process the donated blood. RISKS AND COMPLICATIONS Although transfusion therapy is very safe and saves many lives, the main dangers of transfusion include:  Getting an infectious disease. Developing a transfusion reaction. This is an allergic reaction to something in the blood you were given. Every precaution is taken to prevent this. The  decision to have a blood transfusion has been considered carefully by your caregiver before blood is given. Blood is not given unless the benefits outweigh the risks. AFTER THE TRANSFUSION Right after receiving a blood transfusion, you will usually feel much better and more energetic. This is especially true if your red blood cells have gotten low (anemic). The transfusion raises the level of the red blood cells which carry oxygen, and this usually causes an energy increase. The nurse administering the transfusion will monitor you carefully for complications. HOME CARE INSTRUCTIONS  No special instructions are needed after a transfusion. You may find your energy is better. Speak with your caregiver about any limitations on activity for underlying diseases you may have. SEEK MEDICAL CARE IF:  Your condition is not improving after your transfusion. You develop redness or irritation at the intravenous (IV) site. SEEK IMMEDIATE MEDICAL CARE IF:  Any of the following symptoms occur over the next 12 hours: Shaking chills. You have a temperature by mouth above 102 F (38.9 C), not controlled by medicine. Chest, back, or muscle pain. People around you feel you are not acting correctly or are confused. Shortness of breath or difficulty breathing. Dizziness and fainting. You get a rash or develop hives. You have a decrease in urine  output. Your urine turns a dark color or changes to pink, red, or brown. Any of the following symptoms occur over the next 10 days: You have a temperature by mouth above 102 F (38.9 C), not controlled by medicine. Shortness of breath. Weakness after normal activity. The white part of the eye turns yellow (jaundice). You have a decrease in the amount of urine or are urinating less often. Your urine turns a dark color or changes to pink, red, or brown. Document Released: 06/11/2000 Document Revised: 09/06/2011 Document Reviewed: 01/29/2008 [facility] Patient Information 2014 [place], [place].  _______________________________________________________________________

## 2022-03-22 ENCOUNTER — Telehealth: Payer: Self-pay | Admitting: *Deleted

## 2022-03-22 NOTE — Telephone Encounter (Signed)
Last entry----attempted to reach the patient on 9/22 with no answer. Sent the patient a my chart message to call the office. Also called and spoke with the patient's mother; ask hr to have the patient call the office back

## 2022-03-22 NOTE — Telephone Encounter (Signed)
Spoke with the patient and scheduled appts for tomorrow. Per the patient "I have a matter to attend to tomorrow and can't come until 1 pm." Appts scheduled and providers notified

## 2022-03-23 ENCOUNTER — Encounter (HOSPITAL_COMMUNITY)
Admission: RE | Admit: 2022-03-23 | Discharge: 2022-03-23 | Disposition: A | Discharge status: Home / Self Care | Payer: Medicare Other | Source: Ambulatory Visit | Attending: Anesthesiology | Admitting: Anesthesiology

## 2022-03-23 ENCOUNTER — Inpatient Hospital Stay: Payer: Medicare Other | Admitting: Gynecologic Oncology

## 2022-03-23 DIAGNOSIS — D259 Leiomyoma of uterus, unspecified: Secondary | ICD-10-CM

## 2022-03-23 NOTE — Progress Notes (Addendum)
Patient missed PAT appointment because her transportation did not show up.  She will come by and get her labs done and PAT will be completed over the phone.

## 2022-03-23 NOTE — Patient Instructions (Addendum)
Preparing for your Surgery   Plan for surgery on April 01, 2022 with Dr. Jeral Pinch at [facility] will be scheduled for robotic assisted laparoscopic total hysterectomy (removal of the uterus and cervix), bilateral salpingo-oophorectomy (removal of both ovaries and fallopian tubes), possible staging if a cancer is identified.   Dr. Lindi Adie would like for you to stop taking tamoxifen on March 25, 2022 and you can restart taking this on April 08, 2022 after surgery.   Pre-operative Testing -You will receive a phone call from presurgical testing at [facility] to arrange for a pre-operative appointment and lab work.   -Bring your insurance card, copy of an advanced directive if applicable, medication list   -At that visit, you will be asked to sign a consent for a possible blood transfusion in case a transfusion becomes necessary during surgery.  The need for a blood transfusion is rare but having consent is a necessary part of your care.      -You should not be taking blood thinners or aspirin at least ten days prior to surgery unless instructed by your surgeon.   -Do not take supplements such as fish oil (omega 3), red yeast rice, turmeric before your surgery. You want to avoid medications with aspirin in them including headache powders such as BC or Goody's), Excedrin migraine.   Day Before Surgery at [place] will be asked to take in a light diet the day before surgery. You will be advised you can have clear liquids up until 3 hours before your surgery.     Eat a light diet the day before surgery.  Examples including soups, broths, toast, yogurt, mashed potatoes.  AVOID GAS PRODUCING FOODS. Things to avoid include carbonated beverages (fizzy beverages, sodas), raw fruits and raw vegetables (uncooked), or beans.    If your bowels are filled with gas, your surgeon will have difficulty visualizing your pelvic organs which increases your surgical risks.    Your role in recovery Your role is to become active as soon as directed by your doctor, while still giving yourself time to heal.  Rest when you feel tired. You will be asked to do the following in order to speed your recovery:   - Cough and breathe deeply. This helps to clear and expand your lungs and can prevent pneumonia after surgery.  - [place]. Do mild physical activity. Walking or moving your legs help your circulation and body functions return to normal. Do not try to get up or walk alone the first time after surgery.   -If you develop swelling on one leg or the other, pain in the back of your leg, redness/warmth in one of your legs, please call the office or go to the Emergency Room to have a doppler to rule out a blood clot. For shortness of breath, chest pain-seek care in the Emergency Room as soon as possible. - Actively manage your pain. Managing your pain lets you move in comfort. We will ask you to rate your pain on a scale of zero to 10. It is your responsibility to tell your doctor or nurse where and how much you hurt so your pain can be treated.   Special Considerations -If you are diabetic, you may be placed on insulin after surgery to have closer control over your blood sugars to promote healing and recovery.  This does not mean that you will be discharged on insulin.  If applicable, your oral antidiabetics  will be resumed when you are tolerating a solid diet.   -Your final pathology results from surgery should be available around one week after surgery and the results will be relayed to you when available.   -FMLA forms can be faxed to [phone] and please allow 5-7 business days for completion.   Pain Management After Surgery -You have been prescribed your pain medication and bowel regimen medications before surgery so that you can have these available when you are discharged from the hospital. The pain medication is for use ONLY AFTER surgery and a  new prescription will not be given.    -Make sure that you have Tylenol and Ibuprofen IF YOU ARE ABLE TO TAKE THESE MEDICATIONS at home to use on a regular basis after surgery for pain control. We recommend alternating the medications every hour to six hours since they work differently and are processed in the body differently for pain relief.   -Review the attached handout on narcotic use and their risks and side effects.    Bowel Regimen -You have been prescribed Sennakot-S to take nightly to prevent constipation especially if you are taking the narcotic pain medication intermittently.  It is important to prevent constipation and drink adequate amounts of liquids. You can stop taking this medication when you are not taking pain medication and you are back on your normal bowel routine.   Risks of Surgery Risks of surgery are low but include bleeding, infection, damage to surrounding structures, re-operation, blood clots, and very rarely death.     Blood Transfusion Information (For the consent to be signed before surgery)   We will be checking your blood type before surgery so in case of emergencies, we will know what type of blood you would need.                                             WHAT IS A BLOOD TRANSFUSION?   A transfusion is the replacement of blood or some of its parts. Blood is made up of multiple cells which provide different functions. Red blood cells carry oxygen and are used for blood loss replacement. White blood cells fight against infection. Platelets control bleeding. Plasma helps clot blood. Other blood products are available for specialized needs, such as hemophilia or other clotting disorders. BEFORE THE TRANSFUSION  Who gives blood for transfusions?  You may be able to donate blood to be used at a later date on yourself (autologous donation). Relatives can be asked to donate blood. This is generally not any safer than if you have received blood from a stranger.  The same precautions are taken to ensure safety when a relative's blood is donated. Healthy volunteers who are fully evaluated to make sure their blood is safe. This is blood bank blood. Transfusion therapy is the safest it has ever been in the practice of medicine. Before blood is taken from a donor, a complete history is taken to make sure that person has no history of diseases nor engages in risky social behavior (examples are intravenous drug use or sexual activity with multiple partners). The donor's travel history is screened to minimize risk of transmitting infections, such as malaria. The donated blood is tested for signs of infectious diseases, such as HIV and hepatitis. The blood is then tested to be sure it is compatible with you in order to minimize the  chance of a transfusion reaction. If you or a relative donates blood, this is often done in anticipation of surgery and is not appropriate for emergency situations. It takes many days to process the donated blood. RISKS AND COMPLICATIONS Although transfusion therapy is very safe and saves many lives, the main dangers of transfusion include:  Getting an infectious disease. Developing a transfusion reaction. This is an allergic reaction to something in the blood you were given. Every precaution is taken to prevent this. The decision to have a blood transfusion has been considered carefully by your caregiver before blood is given. Blood is not given unless the benefits outweigh the risks.   AFTER SURGERY INSTRUCTIONS   Return to work: 4-6 weeks if applicable   Activity: 1. Be up and out of the bed during the day.  Take a nap if needed.  You may walk up steps but be careful and use the hand rail.  Stair climbing will tire you more than you think, you may need to stop part way and rest.    2. No lifting or straining for 6 weeks over 10 pounds. No pushing, pulling, straining for 6 weeks.   3. No driving for around 1 week(s).  Do not drive if  you are taking narcotic pain medicine and make sure that your reaction time has returned.    4. You can shower as soon as the next day after surgery. Shower daily.  Use your regular soap and water (not directly on the incision) and pat your incision(s) dry afterwards; don't rub.  No tub baths or submerging your body in water until cleared by your surgeon. If you have the soap that was given to you by pre-surgical testing that was used before surgery, you do not need to use it afterwards because this can irritate your incisions.    5. No sexual activity and nothing in the vagina for 8-10 weeks.   6. You may experience a small amount of clear drainage from your incisions, which is normal.  If the drainage persists, increases, or changes color please call the office.   7. Do not use creams, lotions, or ointments such as neosporin on your incisions after surgery until advised by your surgeon because they can cause removal of the dermabond glue on your incisions.     8. You may experience vaginal spotting after surgery or around the 6-8 week mark from surgery when the stitches at the top of the vagina begin to dissolve.  The spotting is normal but if you experience heavy bleeding, call our office.   9. Take Tylenol or ibuprofen first for pain if you are able to take these medications and only use narcotic pain medication for severe pain not relieved by the Tylenol or Ibuprofen.  Monitor your Tylenol intake to a max of 4,000 mg in a 24 hour period. You can alternate these medications after surgery.   Diet: 1. Low sodium Heart Healthy Diet is recommended but you are cleared to resume your normal (before surgery) diet after your procedure.   2. It is safe to use a laxative, such as Miralax or Colace, if you have difficulty moving your bowels. You have been prescribed Sennakot-S to take at bedtime every evening after surgery to keep bowel movements regular and to prevent constipation.     Wound Care: 1.  Keep clean and dry.  Shower daily.   Reasons to call the Doctor: Fever - Oral temperature greater than 100.4 degrees Fahrenheit Foul-smelling vaginal  discharge Difficulty urinating Nausea and vomiting Increased pain at the site of the incision that is unrelieved with pain medicine. Difficulty breathing with or without chest pain New calf pain especially if only on one side Sudden, continuing increased vaginal bleeding with or without clots.   Contacts: For questions or concerns you should contact:   Dr. Jeral Pinch at [phone]   Joylene John, NP at [phone]   After Hours: call [phone] and have the GYN Oncologist paged/contacted (after 5 pm or on the weekends).   Messages sent via mychart are for non-urgent matters and are not responded to after hours so for urgent needs, please call the after hours number.

## 2022-03-24 ENCOUNTER — Telehealth: Payer: Self-pay

## 2022-03-24 NOTE — Telephone Encounter (Addendum)
Patient called requesting that her surgery scheduled for 04/01/22 be cancelled. "I have to get my PTO straight first." Advised that her surgery was cancelled yesterday related to non compliance with presurgical appointment. Advised that it is important to call when she can not make an appointment.   Patient will call our clinic when she is ready to schedule surgery.  Patient requested the results of her MR Pelvis completed on 03/07/22.  Routed to providers.  After communicating with Dr. Berline Lopes, called pt and reviewed MRI results. Patient voiced appreciation for information. Also advised pt that she can see MRI results in My Chart.

## 2022-03-24 NOTE — Telephone Encounter (Signed)
Thanks Dawn! I'd really like to get surgery done for her (as she and I have previously discussed). Please encourage her to get back to [place] as soon as she has her PTO sorted out. Thanks!

## 2022-03-24 NOTE — Telephone Encounter (Signed)
Thanks Dawn - I'm a little confused. She and I had a phone conversation on 9/10 to discuss MRI findings. If she needs the results, they are available to her in [place]. I'm unclear if she is asking for someone to go through them again or just to be able to see them.

## 2022-03-30 ENCOUNTER — Encounter: Payer: Self-pay | Admitting: Hematology and Oncology

## 2022-03-30 ENCOUNTER — Telehealth: Payer: Self-pay

## 2022-03-30 NOTE — Telephone Encounter (Signed)
Called and left VM for patient to request status of PTO. Let patient know that Dr. Berline Lopes would like to get patient's surgery scheduled. Requested call back to clinic.

## 2022-04-01 ENCOUNTER — Ambulatory Visit (HOSPITAL_COMMUNITY): Admission: RE | Admit: 2022-04-01 | Payer: Medicare Other | Source: Home / Self Care | Admitting: Gynecologic Oncology

## 2022-04-01 ENCOUNTER — Encounter (HOSPITAL_COMMUNITY): Admission: RE | Payer: Self-pay | Source: Home / Self Care

## 2022-04-01 DIAGNOSIS — Z1501 Genetic susceptibility to malignant neoplasm of breast: Secondary | ICD-10-CM

## 2022-04-01 SURGERY — HYSTERECTOMY, TOTAL, ROBOT-ASSISTED, LAPAROSCOPIC, WITH BILATERAL SALPINGO-OOPHORECTOMY
Anesthesia: General | Laterality: Bilateral

## 2022-04-02 ENCOUNTER — Telehealth: Payer: Self-pay

## 2022-04-02 NOTE — Telephone Encounter (Signed)
Called patient to check on  progress of PTO approval from her employer for surgical date yet to be scheduled.  Patient spoke with employer today. She will update Dr. Berline Lopes at appointment on 04/06/22.

## 2022-04-06 ENCOUNTER — Encounter: Payer: Self-pay | Admitting: Gynecologic Oncology

## 2022-04-06 ENCOUNTER — Inpatient Hospital Stay: Payer: Medicare Other | Attending: Gynecologic Oncology | Admitting: Gynecologic Oncology

## 2022-04-06 DIAGNOSIS — Z1509 Genetic susceptibility to other malignant neoplasm: Secondary | ICD-10-CM | POA: Diagnosis not present

## 2022-04-06 DIAGNOSIS — Z1502 Genetic susceptibility to malignant neoplasm of ovary: Secondary | ICD-10-CM

## 2022-04-06 DIAGNOSIS — Z1501 Genetic susceptibility to malignant neoplasm of breast: Secondary | ICD-10-CM

## 2022-04-06 NOTE — Progress Notes (Signed)
Gynecologic Oncology Telehealth Note: Gyn-Onc  I connected with Gina Ross on 04/06/22 at  4:45 PM EDT by telephone and verified that I am speaking with the correct person using two identifiers.  I discussed the limitations, risks, security and privacy concerns of performing an evaluation and management service by telemedicine and the availability of in-person appointments. I also discussed with the patient that there may be a patient responsible charge related to this service. The patient expressed understanding and agreed to proceed.  Other persons participating in the visit and their role in the encounter: None.  Patient's location: Home Provider's location: [facility]  Reason for Visit: Follow-up  Treatment History: Oncology History  Malignant neoplasm of overlapping sites of right breast in female, estrogen receptor positive ([place])  08/17/2019 Initial Diagnosis   Patient palpated a right breast mass and right axilla mass and noted skin redness x2 weeks. She went to the ED and had an abscess drained with no improvement. Diagnostic mammogram and US showed a 7.3cm right breast mass extending into all 4 quadrants and partially into overlying skin with 12 abnormal right axillary lymph nodes. Biopsy  showed IDC with necrosis in the breast and axilla, grade 3, HER-2 equivocal by IHC, negative by FISH, ER+ 40% weak, PR -, Ki67 90%.    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 The report date is 09/03/2019.  The Common Hereditary Cancers Panel offered by [facility] includes sequencing and/or deletion duplication testing of the following 48 genes: APC, ATM, AXIN2, BARD1, BMPR1A, BRCA1, BRCA2, BRIP1, CDH1, CDK4, CDKN2A (p14ARF), CDKN2A (p16INK4a), CHEK2, CTNNA1, DICER1, EPCAM (Deletion/duplication testing only), GREM1 (promoter region deletion/duplication testing  only), KIT, MEN1, MLH1, MSH2, MSH3, MSH6, MUTYH, NBN, NF1, NHTL1, PALB2, PDGFRA, PMS2, POLD1, POLE, PTEN, RAD50, RAD51C, RAD51D, RNF43, SDHB, SDHC, SDHD, SMAD4, SMARCA4. STK11, TP53, TSC1, TSC2, and VHL.  The following genes were evaluated for sequence changes only: SDHA and HOXB13 c.251G>A variant only.    09/04/2019 - 12/18/2019 Neo-Adjuvant Chemotherapy   Adriamycin and Cytoxan x 4 (09/04/2019 - 10/16/2019 Taxol x 8 (10/30/2019 - 12/18/2019), stopped for neuropathy   01/24/2020 Surgery   Right mastectomy [name]) [phone]): no residual carcinoma, 5 lymph nodes negative for carcinoma.   03/25/2020 - 05/05/2020 Radiation Therapy   The patient initially received a dose of 50 Gy in 25 fractions to the breast and right supraclavicular area using whole-breast tangent fields. This was delivered using a 3-D conformal technique. The pt received a boost delivering an additional 10 Gy in 5 fractions using a electron boost with 80mV electrons. The total dose was 60 Gy.   06/2020 -  Anti-estrogen oral therapy   Tamoxifen. 1/2 tablet daily for a month, then increase to 20 mg daily.     Interval History: Doing well, denies any new symptoms. Moving next week. Submitted PTO request to work, currently in process.  Think she will be approved to have time off by the end of November.  Would like to get surgery scheduled before the end of the year.  Past Medical/Surgical History: Past Medical History:  Diagnosis Date   Asthma    as a child   BRCA1 gene mutation positive in female    [place]    Family history of breast cancer    Family history of colon cancer    Family history of prostate cancer    History of radiation  therapy 03/25/2020-05/05/2020   IMRT to right breast     Dr James Kinard   Personal history of chemotherapy    Personal history of radiation therapy     Past Surgical History:  Procedure Laterality Date   COLONOSCOPY     HARDWARE REMOVAL Right 09/11/2014   Procedure:  Removal Deep Hardware Right Knee;  Surgeon: Marcus Duda V, MD;  Location: [facility];  Service: Orthopedics;  Laterality: Right;   IR IMAGING GUIDED PORT INSERTION  09/04/2019   MASTECTOMY WITH AXILLARY LYMPH NODE DISSECTION Right 01/24/2020   Procedure: RIGHT MASTECTOMY WITH AXILLARY LYMPH NODE DISSECTION;  Surgeon: Wakefield, Matthew, MD;  Location: [facility] OR;  Service: General;  Laterality: Right;  PEC BLOCK   ORIF PATELLA Right 03/22/2014   Procedure: Open Reduction Internal Fixation Right Patella ;  Surgeon: Marcus Duda V, MD;  Location: [facility] OR;  Service: Orthopedics;  Laterality: Right;    Family History  Problem Relation Age of Onset   Hypertension Mother    Diabetes Mother    Breast cancer Mother        dx. 40s   Hypertension Father    Colon cancer Paternal Aunt        dx. 30s   Breast cancer Maternal Grandmother        bilateral   Cervical cancer Maternal Grandmother        tumor on cervix   Prostate cancer Maternal Uncle        dx. 50s   Breast cancer Cousin        dx. late 30s   Breast cancer Cousin        dx. <50   Pancreatic cancer Neg Hx    Endometrial cancer Neg Hx     Social History   Socioeconomic History   Marital status: Legally Separated    Spouse name: Not on file   Number of children: Not on file   Years of education: Not on file   Highest education level: Professional school degree (e.g., MD, DDS, DVM, JD)  Occupational History   Occupation: med tech    Comment: [employer]  Tobacco Use   Smoking status: Former    Packs/day: 0.10    Types: Cigarettes   Smokeless tobacco: Never   Tobacco comments:    marijuana  Vaping Use   Vaping Use: Never used  Substance and Sexual Activity   Alcohol use: Yes    Comment: socailly   Drug use: Yes    Types: Marijuana   Sexual activity: Not Currently    Birth control/protection: None  Other Topics Concern   Not on file  Social History Narrative   Not on file   Social Determinants of Health    Financial Resource Strain: High Risk (03/12/2020)   Overall Financial Resource Strain (CARDIA)    Difficulty of Paying Living Expenses: Very hard  Food Insecurity: Not on file  Transportation Needs: Unmet Transportation Needs (03/12/2020)   PRAPARE - Transportation    Lack of Transportation (Medical): Yes    Lack of Transportation (Non-Medical): Yes  Physical Activity: Not on file  Stress: Not on file  Social Connections: Not on file    Current Medications:  Current Outpatient Medications:    acetaminophen (TYLENOL) 325 MG tablet, Take 650 mg by mouth every 6 (six) hours as needed for moderate pain or headache. , Disp: , Rfl:    cetirizine (ZYRTEC) 10 MG tablet, Take 10 mg by mouth daily as needed for allergies., Disp: ,   Rfl:    loratadine (CLARITIN) 10 MG tablet, Take 10 mg by mouth daily., Disp: , Rfl:    tamoxifen (NOLVADEX) 20 MG tablet, Take 1 tablet by mouth once daily, Disp: 90 tablet, Rfl: 0  Review of Symptoms: Pertinent positives as per HPI.  Physical Exam: Deferred given limitations of phone visit.  Laboratory & Radiologic Studies: 12/25/21: CA- 105   12/31/21: Pelvic ultrasound Nonvisualization of endometrial complex and ovaries. Multiple uterine leiomyomata, including a 4.3 cm central leiomyoma at anterior upper uterus.   03/07/22: Pelvic MRI 1. Numerous uterine fibroids. Uterine fibroids distort the endometrial cavity and obscure the junctional zone. No evidence for endometrial thickening. 2. Neither ovary can be discretely visualized by MRI today. There is no adnexal mass. The 3. Small volume free fluid in the pelvis  Assessment & Plan: Gina Ross is a 51 y.o. woman with history of ER+ breast cancer now on anti-estrogen therapy and BRCA1 mutation.   Patient continues to do well.  Today, she voices wanting to get surgery scheduled for risk reduction in the setting of her BRCA1 mutation in mid November.  She is currently moving and working on approval for  her PTO with work.  I will ask Melissa to get her scheduled in November and reach out tomorrow to give her a date.  I discussed the assessment and treatment plan with the patient. The patient was provided with an opportunity to ask questions and all were answered. The patient agreed with the plan and demonstrated an understanding of the instructions.   The patient was advised to call back or see an in-person evaluation if the symptoms worsen or if the condition fails to improve as anticipated.   6 minutes of total time was spent for this patient encounter, including preparation, phone counseling with the patient and coordination of care, and documentation of the encounter.   Jeral Pinch, MD  Division of Gynecologic Oncology  Department of Obstetrics and Gynecology  [facility]

## 2022-04-07 ENCOUNTER — Encounter: Payer: Self-pay | Admitting: Hematology and Oncology

## 2022-04-23 ENCOUNTER — Inpatient Hospital Stay: Payer: Medicare Other | Admitting: Gynecologic Oncology

## 2022-04-23 DIAGNOSIS — D259 Leiomyoma of uterus, unspecified: Secondary | ICD-10-CM

## 2022-04-23 NOTE — Progress Notes (Unsigned)
Gynecologic Oncology Return Clinic Visit  04/23/22  Reason for Visit: treatment planning  Treatment History: Oncology History  Malignant neoplasm of overlapping sites of right breast in female, estrogen receptor positive ([place])  08/17/2019 Initial Diagnosis   Patient palpated a right breast mass and right axilla mass and noted skin redness x2 weeks. She went to the ED and had an abscess drained with no improvement. Diagnostic mammogram and US showed a 7.3cm right breast mass extending into all 4 quadrants and partially into overlying skin with 12 abnormal right axillary lymph nodes. Biopsy  showed IDC with necrosis in the breast and axilla, grade 3, HER-2 equivocal by IHC, negative by FISH, ER+ 40% weak, PR -, Ki67 90%.    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 The report date is 09/03/2019.  The Common Hereditary Cancers Panel offered by [facility] includes sequencing and/or deletion duplication testing of the following 48 genes: APC, ATM, AXIN2, BARD1, BMPR1A, BRCA1, BRCA2, BRIP1, CDH1, CDK4, CDKN2A (p14ARF), CDKN2A (p16INK4a), CHEK2, CTNNA1, DICER1, EPCAM (Deletion/duplication testing only), GREM1 (promoter region deletion/duplication testing only), KIT, MEN1, MLH1, MSH2, MSH3, MSH6, MUTYH, NBN, NF1, NHTL1, PALB2, PDGFRA, PMS2, POLD1, POLE, PTEN, RAD50, RAD51C, RAD51D, RNF43, SDHB, SDHC, SDHD, SMAD4, SMARCA4. STK11, TP53, TSC1, TSC2, and VHL.  The following genes were evaluated for sequence changes only: SDHA and HOXB13 c.251G>A variant only.    09/04/2019 - 12/18/2019 Neo-Adjuvant Chemotherapy   Adriamycin and Cytoxan x 4 (09/04/2019 - 10/16/2019 Taxol x 8 (10/30/2019 - 12/18/2019), stopped for neuropathy   01/24/2020 Surgery   Right mastectomy [name]) [phone]): no residual carcinoma, 5 lymph nodes negative for carcinoma.   03/25/2020 - 05/05/2020  Radiation Therapy   The patient initially received a dose of 50 Gy in 25 fractions to the breast and right supraclavicular area using whole-breast tangent fields. This was delivered using a 3-D conformal technique. The pt received a boost delivering an additional 10 Gy in 5 fractions using a electron boost with 61mV electrons. The total dose was 60 Gy.   06/2020 -  Anti-estrogen oral therapy   Tamoxifen. 1/2 tablet daily for a month, then increase to 20 mg daily.    12/25/21: CA- 105   12/31/21: Pelvic ultrasound Nonvisualization of endometrial complex and ovaries. Multiple uterine leiomyomata, including a 4.3 cm central leiomyoma at anterior upper uterus.   03/07/22: Pelvic MRI 1. Numerous uterine fibroids. Uterine fibroids distort the endometrial cavity and obscure the junctional zone. No evidence for endometrial thickening. 2. Neither ovary can be discretely visualized by MRI today. There is no adnexal mass. The 3. Small volume free fluid in the pelvis  Interval History: ***  Past Medical/Surgical History: Past Medical History:  Diagnosis Date   Asthma    as a child   BRCA1 gene mutation positive in female    [place]    Family history of breast cancer    Family history of colon cancer    Family history of prostate cancer    History of radiation therapy 03/25/2020-05/05/2020   IMRT to right breast     Dr JGery Pray  Personal history of chemotherapy    Personal history of radiation therapy     Past Surgical History:  Procedure Laterality Date   COLONOSCOPY     HARDWARE REMOVAL Right 09/11/2014   Procedure: Removal Deep Hardware Right Knee;  Surgeon: MNewt Minion MD;  Location:  [facility];  Service: Orthopedics;  Laterality: Right;   IR IMAGING GUIDED PORT INSERTION  09/04/2019   MASTECTOMY WITH AXILLARY LYMPH NODE DISSECTION Right 01/24/2020   Procedure: RIGHT MASTECTOMY WITH AXILLARY LYMPH NODE DISSECTION;  Surgeon: [name], MD;  Location: [facility];  Service:  General;  Laterality: Right;  PEC BLOCK   ORIF PATELLA Right 03/22/2014   Procedure: Open Reduction Internal Fixation Right Patella ;  Surgeon: Newt Minion, MD;  Location: [place];  Service: Orthopedics;  Laterality: Right;    Family History  Problem Relation Age of Onset   Hypertension Mother    Diabetes Mother    Breast cancer Mother        dx. 26s   Hypertension Father    Colon cancer Paternal Aunt        dx. 62s   Breast cancer Maternal Grandmother        bilateral   Cervical cancer Maternal Grandmother        tumor on cervix   Prostate cancer Maternal Uncle        dx. 4s   Breast cancer Cousin        dx. late 73s   Breast cancer Cousin        dx. <50   Pancreatic cancer Neg Hx    Endometrial cancer Neg Hx     Social History   Socioeconomic History   Marital status: Legally Separated    Spouse name: Not on file   Number of children: Not on file   Years of education: Not on file   Highest education level: Professional school degree (e.g., MD, DDS, DVM, JD)  Occupational History   Occupation: med tech    Comment: [employer]  Tobacco Use   Smoking status: Former    Packs/day: 0.10    Types: Cigarettes   Smokeless tobacco: Never   Tobacco comments:    marijuana  Vaping Use   Vaping Use: Never used  Substance and Sexual Activity   Alcohol use: Yes    Comment: socailly   Drug use: Yes    Types: Marijuana   Sexual activity: Not Currently    Birth control/protection: None  Other Topics Concern   Not on file  Social History Narrative   Not on file   Social Determinants of Health   Financial Resource Strain: High Risk (03/12/2020)   Overall Financial Resource Strain (CARDIA)    Difficulty of Paying Living Expenses: Very hard  Food Insecurity: Not on file  Transportation Needs: Unmet Transportation Needs (03/12/2020)   PRAPARE - Hydrologist (Medical): Yes    Lack of Transportation (Non-Medical): Yes  Physical  Activity: Not on file  Stress: Not on file  Social Connections: Not on file    Current Medications:  Current Outpatient Medications:    acetaminophen (TYLENOL) 325 MG tablet, Take 650 mg by mouth every 6 (six) hours as needed for moderate pain or headache. , Disp: , Rfl:    cetirizine (ZYRTEC) 10 MG tablet, Take 10 mg by mouth daily as needed for allergies., Disp: , Rfl:    loratadine (CLARITIN) 10 MG tablet, Take 10 mg by mouth daily., Disp: , Rfl:    tamoxifen (NOLVADEX) 20 MG tablet, Take 1 tablet by mouth once daily, Disp: 90 tablet, Rfl: 0  Review of Systems: Denies appetite changes, fevers, chills, fatigue, unexplained weight changes. Denies hearing loss, neck lumps or masses, mouth sores, ringing in ears or voice changes.  Denies cough or wheezing.  Denies shortness of breath. Denies chest pain or palpitations. Denies leg swelling. Denies abdominal distention, pain, blood in stools, constipation, diarrhea, nausea, vomiting, or early satiety. Denies pain with intercourse, dysuria, frequency, hematuria or incontinence. Denies hot flashes, pelvic pain, vaginal bleeding or vaginal discharge.   Denies joint pain, back pain or muscle pain/cramps. Denies itching, rash, or wounds. Denies dizziness, headaches, numbness or seizures. Denies swollen lymph nodes or glands, denies easy bruising or bleeding. Denies anxiety, depression, confusion, or decreased concentration.  Physical Exam: There were no vitals taken for this visit. General: ***Alert, oriented, no acute distress. HEENT: ***Posterior oropharynx clear, sclera anicteric. Chest: ***Clear to auscultation bilaterally.  ***Port site clean. Cardiovascular: ***Regular rate and rhythm, no murmurs. Abdomen: ***Obese, soft, nontender.  Normoactive bowel sounds.  No masses or hepatosplenomegaly appreciated.  ***Well-healed scar. Extremities: ***Grossly normal range of motion.  Warm, well perfused.  No edema bilaterally. Skin: ***No  rashes or lesions noted. Lymphatics: ***No cervical, supraclavicular, or inguinal adenopathy. GU: Normal appearing external genitalia without erythema, excoriation, or lesions.  Speculum exam reveals ***.  Bimanual exam reveals ***.  ***Rectovaginal exam  confirms ___.  Laboratory & Radiologic Studies: None new  Assessment & Plan: Gina Ross is a 51 y.o. woman with BRCA1 mutation and personal history of breast cancer who desires risk-reducing surgery.  ***  *** minutes of total time was spent for this patient encounter, including preparation, face-to-face counseling with the patient and coordination of care, and documentation of the encounter.  Jeral Pinch, MD  Division of Gynecologic Oncology  Department of Obstetrics and Gynecology  [facility]

## 2022-06-22 ENCOUNTER — Other Ambulatory Visit: Payer: Self-pay | Admitting: Hematology and Oncology

## 2022-07-19 ENCOUNTER — Ambulatory Visit (INDEPENDENT_AMBULATORY_CARE_PROVIDER_SITE_OTHER): Payer: Medicare Other

## 2022-07-19 ENCOUNTER — Ambulatory Visit
Admission: EM | Admit: 2022-07-19 | Discharge: 2022-07-19 | Disposition: A | Discharge status: Home / Self Care | Payer: Medicare Other | Attending: Internal Medicine | Admitting: Internal Medicine

## 2022-07-19 ENCOUNTER — Encounter: Payer: Self-pay | Admitting: Hematology and Oncology

## 2022-07-19 DIAGNOSIS — M79641 Pain in right hand: Secondary | ICD-10-CM

## 2022-07-19 DIAGNOSIS — H6122 Impacted cerumen, left ear: Secondary | ICD-10-CM | POA: Diagnosis not present

## 2022-07-19 DIAGNOSIS — H9202 Otalgia, left ear: Secondary | ICD-10-CM

## 2022-07-19 MED ORDER — PREDNISONE 20 MG PO TABS: 20.0000 mg | ORAL_TABLET | Freq: Every day | ORAL | 0 refills | Status: AC

## 2022-07-19 MED ORDER — DOXYCYCLINE HYCLATE 100 MG PO CAPS: 100.0000 mg | ORAL_CAPSULE | Freq: Two times a day (BID) | ORAL | 0 refills | Status: DC

## 2022-07-19 NOTE — Discharge Instructions (Signed)
Your ear has been washed out.  There is some redness to your eardrum that is concerning for infection so I am treating this with an antibiotic.  Please take this medication with food to avoid nausea.  Your hand x-ray is showing some arthritis which could be contributing to your pain.  I have prescribed prednisone to decrease this information.  Also recommend elevation of the hand and ice application.  Follow-up with hand orthopedic specialist at provided contact information if symptoms persist or worsen.

## 2022-07-19 NOTE — ED Triage Notes (Signed)
Pt c/o right hand swelling and pain onset ~ Friday. Denies known trauma/injury.   Also c/o left ear pain onset ~ 1.5 weeks ago.

## 2022-07-19 NOTE — ED Provider Notes (Addendum)
[facility]    CSN: [id number] Arrival date & time: 07/19/22  0831      History   Chief Complaint Chief Complaint  Patient presents with   hand swell    HPI Gina Ross is a 52 y.o. female.   Patient presents with 2 different chief complaints today.  Patient reports left ear pain that has been present for 1.5 weeks.  Patient denies any associated upper respiratory symptoms, cough, fever.  Denies trauma, foreign body, drainage from the ear, decreased hearing.  She has not taken any medications for ear pain.  Patient also presents with right hand pain.  Patient denies any trauma or injury to the area.  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  Denies history of chronic hand pain.  Reports this started approximately 4 days ago.  Has taken 1 dose of ibuprofen with minimal improvement.     Past Medical History:  Diagnosis Date   Asthma    as a child   BRCA1 gene mutation positive in female    Breast cancer [facility])    Family history of breast cancer    Family history of colon cancer    Family history of prostate cancer    History of radiation therapy 03/25/2020-05/05/2020   IMRT to right breast     Dr [name]   Personal history of chemotherapy    Personal history of radiation therapy     Patient Active Problem List   Diagnosis Date Noted   Breast cancer, right ([place]) 01/24/2020   Port-A-Cath in place 09/18/2019   BRCA1 gene mutation positive in female 09/05/2019   Genetic testing 09/05/2019   Family history of breast cancer    Family history of prostate cancer    Family history of colon cancer    Malignant neoplasm of overlapping sites of right breast in female, estrogen receptor positive ([place]) 08/17/2019   Unilateral primary osteoarthritis, right knee 01/31/2017    Past Surgical History:  Procedure Laterality Date   COLONOSCOPY     HARDWARE REMOVAL  Right 09/11/2014   Procedure: Removal Deep Hardware Right Knee;  Surgeon: Newt Minion, MD;  Location: [place];  Service: Orthopedics;  Laterality: Right;   IR IMAGING GUIDED PORT INSERTION  09/04/2019   MASTECTOMY WITH AXILLARY LYMPH NODE DISSECTION Right 01/24/2020   Procedure: RIGHT MASTECTOMY WITH AXILLARY LYMPH NODE DISSECTION;  Surgeon: [name], MD;  Location: [place];  Service: General;  Laterality: Right;  PEC BLOCK   ORIF PATELLA Right 03/22/2014   Procedure: Open Reduction Internal Fixation Right Patella ;  Surgeon: Newt Minion, MD;  Location: [place];  Service: Orthopedics;  Laterality: Right;    OB History     Gravida  1   Para  1   Term      Preterm      AB      Living         SAB      IAB      Ectopic      Multiple      Live Births               Home Medications    Prior to Admission medications   Medication Sig Start Date End Date Taking? Authorizing Provider  doxycycline (VIBRAMYCIN) 100 MG capsule Take 1 capsule (100 mg total) by mouth 2 (two)  times daily. 07/19/22  Yes Wael Maestas, Hildred Alamin E, FNP  predniSONE (DELTASONE) 20 MG tablet Take 1 tablet (20 mg total) by mouth daily for 5 days. 07/19/22 07/24/22 Yes Shakeitha Umbaugh, Michele Rockers, FNP  acetaminophen (TYLENOL) 325 MG tablet Take 650 mg by mouth every 6 (six) hours as needed for moderate pain or headache.     [provider]  cetirizine (ZYRTEC) 10 MG tablet Take 10 mg by mouth daily as needed for allergies.    [provider]  loratadine (CLARITIN) 10 MG tablet Take 10 mg by mouth daily.    [provider]  tamoxifen (NOLVADEX) 20 MG tablet Take 1 tablet by mouth once daily 06/22/22   [name], MD    Family History Family History  Problem Relation Age of Onset   Hypertension Mother    Diabetes Mother    Breast cancer Mother        dx. 51s   Hypertension Father    Colon cancer Paternal Aunt        dx. 17s   Breast cancer Maternal Grandmother        bilateral   Cervical  cancer Maternal Grandmother        tumor on cervix   Prostate cancer Maternal Uncle        dx. 8s   Breast cancer Cousin        dx. late 61s   Breast cancer Cousin        dx. <50   Pancreatic cancer Neg Hx    Endometrial cancer Neg Hx     Social History Social History   Tobacco Use   Smoking status: Former    Packs/day: 0.10    Types: Cigarettes   Smokeless tobacco: Never   Tobacco comments:    marijuana  Vaping Use   Vaping Use: Never used  Substance Use Topics   Alcohol use: Yes    Comment: socailly   Drug use: Yes    Types: Marijuana     Allergies   Penicillins   Review of Systems Review of Systems Per HPI  Physical Exam Triage Vital Signs ED Triage Vitals [07/19/22 0844]  Enc Vitals Group     BP 130/87     Pulse Rate 92     Resp 16     Temp 98.3 F (36.8 C)     Temp Source Oral     SpO2 98 %     Weight      Height      Head Circumference      Peak Flow      Pain Score 6     Pain Loc      Pain Edu?      Excl. in [place]?    No data found.  Updated Vital Signs BP 130/87 (BP Location: Left Arm)   Pulse 92   Temp 98.3 F (36.8 C) (Oral)   Resp 16   SpO2 98%   Visual Acuity Right Eye Distance:   Left Eye Distance:   Bilateral Distance:    Right Eye Near:   Left Eye Near:    Bilateral Near:     Physical Exam Constitutional:      General: She is not in acute distress.    Appearance: Normal appearance. She is not toxic-appearing or diaphoretic.  HENT:     Head: Normocephalic and atraumatic.     Left Ear: External ear normal. A middle ear effusion is present. There is impacted cerumen. Tympanic membrane is  erythematous. Tympanic membrane is not perforated or bulging.     Ears:     Comments: Impacted cerumen to left ear on original exam.  Ear was irrigated successfully with complete removal of cerumen.  On second physical exam, there is a middle ear effusion that is mild with some erythema that is mild as well. Eyes:     Extraocular  Movements: Extraocular movements intact.     Conjunctiva/sclera: Conjunctivae normal.  Pulmonary:     Effort: Pulmonary effort is normal.  Musculoskeletal:     Comments: Tenderness to palpation with associated mild swelling present to third and fourth MCP joints of the dorsal surface of the right hand.  No discoloration, lacerations, abrasions noted.  Grip strength is 5/5.  Neurovascular intact.  Neurological:     General: No focal deficit present.     Mental Status: She is alert and oriented to person, place, and time. Mental status is at baseline.  Psychiatric:        Mood and Affect: Mood normal.        Behavior: Behavior normal.        Thought Content: Thought content normal.        Judgment: Judgment normal.      UC Treatments / Results  Labs (all labs ordered are listed, but only abnormal results are displayed) Labs Reviewed - No data to display  EKG   Radiology DG Hand Complete Right  Result Date: 07/19/2022 CLINICAL DATA:  Right hand pain and swelling, onset 3 days ago EXAM: RIGHT HAND - COMPLETE 3+ VIEW COMPARISON:  Radiographs from 05/30/2017 and 05/10/2017 as well as 04/14/2017 FINDINGS: Mild interphalangeal articular space narrowing. No significant erosions identified. No chondrocalcinosis. No definite soft tissue nodularity or periarticular bony demineralization. No carpal crowding. No fracture or acute bony findings identified. IMPRESSION: 1. Mild articular space narrowing in the interphalangeal joints which may reflect mild osteoarthritis. No erosions or chondrocalcinosis identified. No acute findings. Electronically Signed   By: Van Clines M.D.   On: 07/19/2022 09:26    Procedures Procedures (including critical care time)  Medications Ordered in UC Medications - No data to display  Initial Impression / Assessment and Plan / UC Course  I have reviewed the triage vital signs and the nursing notes.  Pertinent labs & imaging results that were available  during my care of the patient were reviewed by me and considered in my medical decision making (see chart for details).     Patient had impacted cerumen to left ear on original physical exam.  Ear was irrigated successfully with complete removal of cerumen.  On second physical exam, there was a mild middle ear effusion with intact TM.  Although, TM was slightly erythematous which is concerning for infection.  Given penicillin allergy and inability to prescribe azithromycin in addition to tamoxifen, will treat with doxycycline.  Right hand x-ray showing signs of osteoarthritis which could be contributing to patient's pain.  Also the repetitive movements patient does on a daily basis at her workplace could be contributing to inflammation.  Will treat both of these with prednisone.  Patient reports that she has taken this multiple times before and tolerated well due to asthma.  Patient has history of breast cancer but reports that she is currently in remission and is not receiving treatment for this.  Recent oncology note verifies this.  She does take tamoxifen daily. Therefore, Prednisone should be safe but a low-dose and short course.  No other contraindications to prednisone noted  in patient's history.  Patient was advised to elevate extremity and apply ice.  Advised patient to follow-up with hand specialty at provided contact information for further evaluation and management if symptoms persist or worsen.  Advised to avoid repetitive movements of the hands.  Discussed return precautions.  Patient verbalized understanding and was agreeable with plan. Final Clinical Impressions(s) / UC Diagnoses   Final diagnoses:  Left ear pain  Impacted cerumen of left ear  Right hand pain     Discharge Instructions      Your ear has been washed out.  There is some redness to your eardrum that is concerning for infection so I am treating this with an antibiotic.  Please take this medication with food to avoid  nausea.  Your hand x-ray is showing some arthritis which could be contributing to your pain.  I have prescribed prednisone to decrease this information.  Also recommend elevation of the hand and ice application.  Follow-up with hand orthopedic specialist at provided contact information if symptoms persist or worsen.     ED Prescriptions     Medication Sig Dispense Auth. Provider   doxycycline (VIBRAMYCIN) 100 MG capsule Take 1 capsule (100 mg total) by mouth 2 (two) times daily. 20 capsule [place], [place] E, [place]   predniSONE (DELTASONE) 20 MG tablet Take 1 tablet (20 mg total) by mouth daily for 5 days. 5 tablet [place], Michele Rockers, [place]      PDMP not reviewed this encounter.   Teodora Medici, [place] 07/19/22 1230    Teodora Medici, [place] 07/19/22 1230

## 2022-07-20 ENCOUNTER — Other Ambulatory Visit: Payer: Self-pay

## 2022-07-22 ENCOUNTER — Encounter: Payer: Self-pay | Admitting: Hematology and Oncology

## 2022-09-02 ENCOUNTER — Telehealth: Payer: Self-pay | Admitting: *Deleted

## 2022-09-02 NOTE — Telephone Encounter (Signed)
Patient called and scheduled a follow up visit with Dr Berline Lopes to discuss dates for procedure

## 2022-09-28 ENCOUNTER — Other Ambulatory Visit: Payer: Self-pay

## 2022-10-07 ENCOUNTER — Inpatient Hospital Stay: Payer: Medicare Other | Attending: Hematology and Oncology | Admitting: Hematology and Oncology

## 2022-10-07 NOTE — Assessment & Plan Note (Deleted)
08/17/19: Patient palpated a right breast mass and right axilla mass and noted skin redness x2 weeks. She went to the ED and had an abscess drained with no improvement. Diagnostic mammogram and US showed a 7.3cm right breast mass extending into all 4 quadrants and partially into overlying skin with 12 abnormal right axillary lymph nodes. Biopsy  showed IDC with necrosis in the breast and axilla, grade 3, HER-2 equivocal by IHC, negative by FISH, ER+ 40% weak, PR -, Ki67 90%.   Treatment plan: 1. Neoadjuvant chemotherapy with Adriamycin and Cytoxan dose dense 4 followed by Taxol weekly 8 stopped after cycle 8 for neuropathy 2. 01/24/2020: Right mastectomy: No residual cancer identified, 0/5 lymph nodes negative treatment effect in the breast and the lymph nodes.  ER 40% weak staining, PR negative, HER-2 negative, Ki-67 90%, complete pathologic response 3. Followed by adjuvant radiation therapy completed 05/05/2020 4.  Follow-up adjuvant antiestrogen therapy   CT CAP 08/31/2019: Very large central right breast mass, bulky right axillary and subpectoral lymph nodes.  No evidence of distant metastatic disease.  Bulky uterine fibroids Bone scan 08/31/2019: No metastatic disease in the bones ---------------------------------------------------------------------------------------------------------------------------------------------------- Current Treatment: Tamoxifen 20 mg daily started 06/29/19.   Tamoxifen Toxicities: Tolerating it well without any major problems.  Does complain of hot flashes but they are not unbearable. Denies any peripheral neuropathy at this time.   Breast cancer Surveillance: 1. Breast Exam: 10/07/2022 benign 2. Mammogram: 02/02/2022: Benign breast density category C. 3.  Because she is BRCA1 mutation positive: She will need a breast MRI every year.  Breast MRI 09/02/2021: Previously seen mass in the left breast on the MRI is no longer visualized.  The recommended a 39-month follow-up breast  MRI.  I will order a new breast MRI to be done.   She plans to undergo reconstruction on the right chest later in the year.  She sees Dr. Leta Baptist.  She is planning to undergo hysterectomy and bilateral salpingo-oophorectomy with Dr. Pricilla Holm later this year as well.   RTC in 1 year

## 2022-10-19 ENCOUNTER — Encounter: Payer: Self-pay | Admitting: Gynecologic Oncology

## 2022-10-21 ENCOUNTER — Other Ambulatory Visit: Payer: Self-pay

## 2022-10-21 ENCOUNTER — Inpatient Hospital Stay: Payer: Medicare Other | Admitting: Gynecologic Oncology

## 2022-10-21 VITALS — BP 111/72 | HR 106 | Temp 100.9°F | Wt 154.4 lb

## 2022-10-21 DIAGNOSIS — D259 Leiomyoma of uterus, unspecified: Secondary | ICD-10-CM

## 2022-10-21 DIAGNOSIS — Z1501 Genetic susceptibility to malignant neoplasm of breast: Secondary | ICD-10-CM

## 2022-10-21 DIAGNOSIS — Z170 Estrogen receptor positive status [ER+]: Secondary | ICD-10-CM

## 2022-10-21 DIAGNOSIS — R971 Elevated cancer antigen 125 [CA 125]: Secondary | ICD-10-CM

## 2022-10-21 NOTE — Progress Notes (Signed)
The patient presented for follow-up today.  She was tachycardic to 106 with a fever of 100.9 F, endorsing chills.  Given this, she was rescheduled for a visit with me in several weeks.  Orders placed for CA125 and pelvic ultrasound.  Eugene Garnet MD Gynecologic Oncology

## 2022-10-22 ENCOUNTER — Other Ambulatory Visit: Payer: Self-pay

## 2022-11-11 ENCOUNTER — Telehealth: Payer: Self-pay | Admitting: *Deleted

## 2022-11-11 NOTE — Telephone Encounter (Signed)
Moved patient's appt from 2 pm to 1 pm on 5/23

## 2022-11-16 ENCOUNTER — Encounter: Payer: Self-pay | Admitting: Gynecologic Oncology

## 2022-11-18 ENCOUNTER — Inpatient Hospital Stay: Payer: Medicare Other | Admitting: Gynecologic Oncology

## 2022-11-18 ENCOUNTER — Telehealth: Payer: Self-pay | Admitting: *Deleted

## 2022-11-18 DIAGNOSIS — D259 Leiomyoma of uterus, unspecified: Secondary | ICD-10-CM

## 2022-11-18 NOTE — Telephone Encounter (Signed)
Patient canceled appt for today and is request lab/US to be done before appt. Explained that the office would cancel appt for today and schedule the other appts.

## 2022-11-24 ENCOUNTER — Encounter: Payer: Self-pay | Admitting: Hematology and Oncology

## 2022-11-24 NOTE — Telephone Encounter (Signed)
Per Gina Ross APP patient scheduled for a [place] and Lab appt on 5/31. Patient aware

## 2022-11-25 ENCOUNTER — Encounter: Payer: Self-pay | Admitting: Hematology and Oncology

## 2022-11-26 ENCOUNTER — Other Ambulatory Visit: Payer: Medicare Other

## 2022-11-26 ENCOUNTER — Ambulatory Visit (HOSPITAL_COMMUNITY): Admission: RE | Admit: 2022-11-26 | Payer: Medicare Other | Source: Ambulatory Visit

## 2022-11-27 ENCOUNTER — Other Ambulatory Visit: Payer: Self-pay

## 2022-11-29 ENCOUNTER — Telehealth: Payer: Self-pay | Admitting: *Deleted

## 2022-11-29 NOTE — Telephone Encounter (Signed)
Rescheduled missed [place] scan and lab appts from 5/31 to 6/6. Patient aware

## 2022-11-30 ENCOUNTER — Other Ambulatory Visit: Payer: Self-pay

## 2022-12-02 ENCOUNTER — Ambulatory Visit (HOSPITAL_COMMUNITY)
Admission: RE | Admit: 2022-12-02 | Discharge: 2022-12-02 | Disposition: A | Discharge status: Home / Self Care | Payer: Medicare Other | Source: Ambulatory Visit | Attending: Gynecologic Oncology | Admitting: Gynecologic Oncology

## 2022-12-02 ENCOUNTER — Other Ambulatory Visit: Payer: Self-pay

## 2022-12-02 ENCOUNTER — Inpatient Hospital Stay: Payer: Medicare Other | Attending: Hematology and Oncology

## 2022-12-02 DIAGNOSIS — Z148 Genetic carrier of other disease: Secondary | ICD-10-CM | POA: Diagnosis not present

## 2022-12-02 DIAGNOSIS — Z170 Estrogen receptor positive status [ER+]: Secondary | ICD-10-CM | POA: Insufficient documentation

## 2022-12-02 DIAGNOSIS — Z1502 Genetic susceptibility to malignant neoplasm of ovary: Secondary | ICD-10-CM | POA: Insufficient documentation

## 2022-12-02 DIAGNOSIS — D259 Leiomyoma of uterus, unspecified: Secondary | ICD-10-CM | POA: Insufficient documentation

## 2022-12-02 DIAGNOSIS — R978 Other abnormal tumor markers: Secondary | ICD-10-CM | POA: Insufficient documentation

## 2022-12-02 DIAGNOSIS — R971 Elevated cancer antigen 125 [CA 125]: Secondary | ICD-10-CM | POA: Insufficient documentation

## 2022-12-02 DIAGNOSIS — C50811 Malignant neoplasm of overlapping sites of right female breast: Secondary | ICD-10-CM | POA: Insufficient documentation

## 2022-12-02 DIAGNOSIS — Z1501 Genetic susceptibility to malignant neoplasm of breast: Secondary | ICD-10-CM | POA: Insufficient documentation

## 2022-12-02 DIAGNOSIS — Z1509 Genetic susceptibility to other malignant neoplasm: Secondary | ICD-10-CM | POA: Insufficient documentation

## 2022-12-04 ENCOUNTER — Other Ambulatory Visit: Payer: Self-pay

## 2022-12-04 LAB — CA 125: Cancer Antigen (CA) 125: 10.2 U/mL (ref 0.0–38.1)

## 2022-12-09 ENCOUNTER — Other Ambulatory Visit: Payer: Self-pay | Admitting: Gynecologic Oncology

## 2022-12-09 ENCOUNTER — Telehealth: Payer: Self-pay | Admitting: *Deleted

## 2022-12-09 ENCOUNTER — Telehealth: Payer: Self-pay | Admitting: Surgery

## 2022-12-09 DIAGNOSIS — Z1501 Genetic susceptibility to malignant neoplasm of breast: Secondary | ICD-10-CM

## 2022-12-09 DIAGNOSIS — Z170 Estrogen receptor positive status [ER+]: Secondary | ICD-10-CM

## 2022-12-09 DIAGNOSIS — R1900 Intra-abdominal and pelvic swelling, mass and lump, unspecified site: Secondary | ICD-10-CM

## 2022-12-09 NOTE — Progress Notes (Signed)
See RN note. MRI ordered to follow up on [place] results.

## 2022-12-09 NOTE — Telephone Encounter (Signed)
-----   Message from Doylene Bode, NP sent at 12/09/2022  2:48 PM EDT ----- Please let pt know:  Patient had a recent US. Her recent CA 125 is normal. In regards to the ultrasound, it is showing fibroids in the uterus that have been there. The right ovary was normal in appearance with no masses. They did not visualize the left ovary. They do note a complex cystic structure in the pelvis that measures 3.1 cm. The radiologist is recommending an MRI to further evaluate this area. If she is agreeable with this, I will order the MRI then we will arrange for follow up in the office with Dr. Pricilla Holm to discuss further.

## 2022-12-09 NOTE — Telephone Encounter (Signed)
Called patient to review [place] and CA125 results. LVM.

## 2022-12-09 NOTE — Telephone Encounter (Signed)
Spoke with Gina Ross in regards to her scheduled MRI at [facility] on Tuesday, June 18 th at 0630. Pt is to have nothing to eat or drink 4 hours prior to her scheduled appointment and no caffeine or carbonated beverages and eat a light diet the evening before. Pt agreed to date and time and receptive to instructions. No further questions or concerns at this time.

## 2022-12-09 NOTE — Telephone Encounter (Signed)
Patient returned call to review results. Patient states she is ok with MRI being scheduled. No other concerns at this time.

## 2022-12-10 ENCOUNTER — Encounter: Payer: Self-pay | Admitting: Hematology and Oncology

## 2022-12-10 ENCOUNTER — Other Ambulatory Visit: Payer: Self-pay

## 2022-12-14 ENCOUNTER — Ambulatory Visit (HOSPITAL_COMMUNITY)
Admission: RE | Admit: 2022-12-14 | Discharge: 2022-12-14 | Disposition: A | Discharge status: Home / Self Care | Payer: Medicare Other | Source: Ambulatory Visit | Attending: Gynecologic Oncology | Admitting: Gynecologic Oncology

## 2022-12-14 NOTE — Progress Notes (Signed)
Patient was scheduled for MRI Pelvis today at 7am. Patient was a no call no show for her appointment. Patient was called at 7:30a to see if she was coming for appointment but no answer. Left Voicemail for patient to call back to R/S appointment

## 2022-12-16 ENCOUNTER — Other Ambulatory Visit: Payer: Self-pay

## 2022-12-20 ENCOUNTER — Ambulatory Visit (HOSPITAL_COMMUNITY): Payer: Medicare Other

## 2022-12-21 ENCOUNTER — Other Ambulatory Visit: Payer: Self-pay | Admitting: Hematology and Oncology

## 2022-12-23 ENCOUNTER — Telehealth: Payer: Self-pay | Admitting: Hematology and Oncology

## 2022-12-23 ENCOUNTER — Encounter: Payer: Self-pay | Admitting: Hematology and Oncology

## 2022-12-23 ENCOUNTER — Telehealth: Payer: Self-pay

## 2022-12-23 NOTE — Telephone Encounter (Signed)
Scheduled appointment per scheduling message. Patient is aware of the made appointment. 

## 2022-12-23 NOTE — Telephone Encounter (Signed)
Called Pt in regards to tamoxifen refill request. Pt asks is she needs to keep taking rx. Advised Pt that she has not been seen in office since 2022 and will need to come in for office visit to discuss. Rx refilled for 1 month supply, message sent to scheduling team. Pt verbalized understanding.

## 2022-12-25 ENCOUNTER — Other Ambulatory Visit: Payer: Self-pay

## 2022-12-27 ENCOUNTER — Ambulatory Visit (HOSPITAL_COMMUNITY): Admission: RE | Admit: 2022-12-27 | Payer: Medicare Other | Source: Ambulatory Visit

## 2023-01-05 ENCOUNTER — Other Ambulatory Visit: Payer: Self-pay | Admitting: Gynecologic Oncology

## 2023-01-05 DIAGNOSIS — Z1231 Encounter for screening mammogram for malignant neoplasm of breast: Secondary | ICD-10-CM

## 2023-01-06 ENCOUNTER — Other Ambulatory Visit: Payer: Self-pay

## 2023-01-26 ENCOUNTER — Inpatient Hospital Stay: Payer: Medicare Other | Attending: Hematology and Oncology | Admitting: Hematology and Oncology

## 2023-01-26 ENCOUNTER — Other Ambulatory Visit: Payer: Self-pay

## 2023-01-26 VITALS — BP 134/106 | HR 92 | Temp 97.8°F | Resp 18 | Ht 69.0 in | Wt 166.4 lb

## 2023-01-26 DIAGNOSIS — Z79810 Long term (current) use of selective estrogen receptor modulators (SERMs): Secondary | ICD-10-CM | POA: Insufficient documentation

## 2023-01-26 DIAGNOSIS — Z923 Personal history of irradiation: Secondary | ICD-10-CM | POA: Insufficient documentation

## 2023-01-26 DIAGNOSIS — Z9221 Personal history of antineoplastic chemotherapy: Secondary | ICD-10-CM | POA: Insufficient documentation

## 2023-01-26 DIAGNOSIS — Z9011 Acquired absence of right breast and nipple: Secondary | ICD-10-CM | POA: Diagnosis not present

## 2023-01-26 DIAGNOSIS — Z170 Estrogen receptor positive status [ER+]: Secondary | ICD-10-CM | POA: Diagnosis not present

## 2023-01-26 DIAGNOSIS — C50811 Malignant neoplasm of overlapping sites of right female breast: Secondary | ICD-10-CM | POA: Diagnosis present

## 2023-01-26 MED ORDER — TAMOXIFEN CITRATE 20 MG PO TABS: 20.0000 mg | ORAL_TABLET | Freq: Every day | ORAL | 3 refills | Status: DC

## 2023-01-26 NOTE — Assessment & Plan Note (Addendum)
08/17/19: Patient palpated a right breast mass and right axilla mass and noted skin redness x2 weeks. She went to the ED and had an abscess drained with no improvement. Diagnostic mammogram and US showed a 7.3cm right breast mass extending into all 4 quadrants and partially into overlying skin with 12 abnormal right axillary lymph nodes. Biopsy  showed IDC with necrosis in the breast and axilla, grade 3, HER-2 equivocal by IHC, negative by FISH, ER+ 40% weak, PR -, Ki67 90%.   Treatment plan: 1. Neoadjuvant chemotherapy with Adriamycin and Cytoxan dose dense 4 followed by Taxol weekly 8 stopped after cycle 8 for neuropathy 2. 01/24/2020: Right mastectomy: No residual cancer identified, 0/5 lymph nodes negative treatment effect in the breast and the lymph nodes.  ER 40% weak staining, PR negative, HER-2 negative, Ki-67 90%, complete pathologic response 3. Followed by adjuvant radiation therapy completed 05/05/2020 4.  Follow-up adjuvant antiestrogen therapy   CT CAP 08/31/2019: Very large central right breast mass, bulky right axillary and subpectoral lymph nodes.  No evidence of distant metastatic disease.  Bulky uterine fibroids Bone scan 08/31/2019: No metastatic disease in the bones ---------------------------------------------------------------------------------------------------------------------------------------------------- Current Treatment: Tamoxifen 20 mg daily started 06/29/19.   Tamoxifen Toxicities: Tolerating it well without any major problems.     Breast cancer Surveillance: 1. Breast Exam: 01/26/2023: Benign 2. Mammogram: Scheduled for 02/01/2023 3.  Breast MRI 08/27/2021: Indeterminate 7 mm left breast mass, although repeat MRI did not show any mass and therefore no biopsy was done. She missed doing her MRI this year.  We will set her up for MRI next year.  Will also request Dr. Pricilla Holm to see her back again for oophorectomy.   She plans to undergo reconstruction on the right chest later  in the year.  She sees Dr. Leta Baptist.  She is planning to undergo hysterectomy and bilateral salpingo-oophorectomy with Dr. Pricilla Holm later this year as well.   RTC in 1 year

## 2023-01-26 NOTE — Progress Notes (Signed)
Patient Care Team: Carver Fila, MD as PCP - General (Gynecologic Oncology) Pershing Proud, RN as Oncology Nurse Navigator Donnelly Angelica, RN as Oncology Nurse Navigator Emelia Loron, MD as Consulting Physician (General Surgery) Serena Croissant, MD as Consulting Physician (Hematology and Oncology) Antony Blackbird, MD as Consulting Physician (Radiation Oncology)  DIAGNOSIS:  Encounter Diagnosis  Name Primary?   Malignant neoplasm of overlapping sites of right breast in female, estrogen receptor positive (HCC) Yes    SUMMARY OF ONCOLOGIC HISTORY: Oncology History  Malignant neoplasm of overlapping sites of right breast in female, estrogen receptor positive (HCC)  08/17/2019 Initial Diagnosis   Patient palpated a right breast mass and right axilla mass and noted skin redness x2 weeks. She went to the ED and had an abscess drained with no improvement. Diagnostic mammogram and US showed a 7.3cm right breast mass extending into all 4 quadrants and partially into overlying skin with 12 abnormal right axillary lymph nodes. Biopsy  showed IDC with necrosis in the breast and axilla, grade 3, HER-2 equivocal by IHC, negative by FISH, ER+ 40% weak, PR -, Ki67 90%.    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 The report date is 09/03/2019.  The Common Hereditary Cancers Panel offered by [facility] includes sequencing and/or deletion duplication testing of the following 48 genes: APC, ATM, AXIN2, BARD1, BMPR1A, BRCA1, BRCA2, BRIP1, CDH1, CDK4, CDKN2A (p14ARF), CDKN2A (p16INK4a), CHEK2, CTNNA1, DICER1, EPCAM (Deletion/duplication testing only), GREM1 (promoter region deletion/duplication testing only), KIT, MEN1, MLH1, MSH2, MSH3, MSH6, MUTYH, NBN, NF1, NHTL1, PALB2, PDGFRA, PMS2, POLD1, POLE, PTEN, RAD50, RAD51C, RAD51D, RNF43, SDHB, SDHC, SDHD, SMAD4, SMARCA4.  STK11, TP53, TSC1, TSC2, and VHL.  The following genes were evaluated for sequence changes only: SDHA and HOXB13 c.251G>A variant only.    09/04/2019 - 12/18/2019 Neo-Adjuvant Chemotherapy   Adriamycin and Cytoxan x 4 (09/04/2019 - 10/16/2019 Taxol x 8 (10/30/2019 - 12/18/2019), stopped for neuropathy   01/24/2020 Surgery   Right mastectomy Dwain Sarna) [phone]): no residual carcinoma, 5 lymph nodes negative for carcinoma.   03/25/2020 - 05/05/2020 Radiation Therapy   The patient initially received a dose of 50 Gy in 25 fractions to the breast and right supraclavicular area using whole-breast tangent fields. This was delivered using a 3-D conformal technique. The pt received a boost delivering an additional 10 Gy in 5 fractions using a electron boost with electrons. The total dose was 60 Gy.   06/2020 -  Anti-estrogen oral therapy   Tamoxifen. 1/2 tablet daily for a month, then increase to 20 mg daily.     CHIEF COMPLIANT: Follow-up of right breast cancer on tamoxifen   INTERVAL HISTORY: Gina Ross is a 52 y.o. with above-mentioned history of right breast cancer who completed neoadjuvant chemotherapy, underwent a right mastectomy, radiation, and is currently on antiestrogen therapy with tamoxifen. She presents to the clinic today for follow-up. Patient reports that she is doing well. She is walking and staying active at work.   ALLERGIES:  is allergic to penicillins.  MEDICATIONS:  Current Outpatient Medications  Medication Sig Dispense Refill   tamoxifen (NOLVADEX) 20 MG tablet Take 1 tablet (20 mg total) by mouth daily. 90 tablet 3   No current facility-administered medications for this visit.    PHYSICAL EXAMINATION: ECOG PERFORMANCE STATUS: 1 - Symptomatic but completely ambulatory  Vitals:   01/26/23 1557  BP: (!) 134/106  Pulse: 92  Resp: 18  Temp: 97.8 F (36.6 C)  SpO2: 98%   Filed Weights   01/26/23 1557  Weight: 166 lb 6.4 oz (75.5 kg)    BREAST:  No palpable masses or nodules in either right chest wall or left breasts. No palpable axillary supraclavicular or infraclavicular adenopathy no breast tenderness or nipple discharge. (exam performed in the presence of a chaperone)  LABORATORY DATA:  I have reviewed the data as listed    Latest Ref Rng & Units 01/17/2020    3:00 PM 12/18/2019   10:38 AM 12/11/2019   11:57 AM  CMP  Glucose 70 - 99 mg/dL 94  90  161   BUN 6 - 20 mg/dL 11  13  10    Creatinine 0.44 - 1.00 mg/dL 0.96  0.45  4.09   Sodium 135 - 145 mmol/L 141  140  140   Potassium 3.5 - 5.1 mmol/L 3.8  3.8  4.2   Chloride 98 - 111 mmol/L 107  107  108   CO2 22 - 32 mmol/L 24  24  24    Calcium 8.9 - 10.3 mg/dL 9.3  9.2  9.1   Total Protein 6.5 - 8.1 g/dL 7.0  7.2  6.6   Total Bilirubin 0.3 - 1.2 mg/dL 0.6  0.3  0.3   Alkaline Phos 38 - 126 U/L 58  65  59   AST 15 - 41 U/L 37  31  45   ALT 0 - 44 U/L 52  33  50     Lab Results  Component Value Date   WBC 3.4 (L) 01/17/2020   HGB 13.1 01/17/2020   HCT 39.3 01/17/2020   MCV 93.6 01/17/2020   PLT 254 01/17/2020   NEUTROABS 1.0 (L) 12/18/2019    ASSESSMENT & PLAN:  Malignant neoplasm of overlapping sites of right breast in female, estrogen receptor positive (HCC) 08/17/19: Patient palpated a right breast mass and right axilla mass and noted skin redness x2 weeks. She went to the ED and had an abscess drained with no improvement. Diagnostic mammogram and US showed a 7.3cm right breast mass extending into all 4 quadrants and partially into overlying skin with 12 abnormal right axillary lymph nodes. Biopsy  showed IDC with necrosis in the breast and axilla, grade 3, HER-2 equivocal by IHC, negative by FISH, ER+ 40% weak, PR -, Ki67 90%.   Treatment plan: 1. Neoadjuvant chemotherapy with Adriamycin and Cytoxan dose dense 4 followed by Taxol weekly 8 stopped after cycle 8 for neuropathy 2. 01/24/2020: Right mastectomy: No residual cancer identified, 0/5 lymph nodes negative  treatment effect in the breast and the lymph nodes.  ER 40% weak staining, PR negative, HER-2 negative, Ki-67 90%, complete pathologic response 3. Followed by adjuvant radiation therapy completed 05/05/2020 4.  Follow-up adjuvant antiestrogen therapy   CT CAP 08/31/2019: Very large central right breast mass, bulky right axillary and subpectoral lymph nodes.  No evidence of distant metastatic disease.  Bulky uterine fibroids Bone scan 08/31/2019: No metastatic disease in the bones ---------------------------------------------------------------------------------------------------------------------------------------------------- Current Treatment: Tamoxifen 20 mg daily started 06/29/19.   Tamoxifen Toxicities: Tolerating it well without any major problems.     Breast cancer Surveillance: 1. Breast Exam: 01/26/2023: Benign 2. Mammogram: Scheduled for 02/01/2023 3.  Breast MRI 08/27/2021: Indeterminate 7 mm left breast mass, although repeat MRI did not show any mass and therefore no biopsy was done. She missed doing her MRI this year.  We will set her up for  MRI next year.  Will also request Dr. Pricilla Holm to see her back again for oophorectomy.   She plans to undergo reconstruction on the right chest later in the year.  She sees Dr. Leta Baptist.  She is planning to undergo hysterectomy and bilateral salpingo-oophorectomy with Dr. Pricilla Holm later this year as well.   RTC in 1 year   Orders Placed This Encounter  Procedures   MR BREAST BILATERAL W WO CONTRAST INC CAD    Standing Status:   Future    Standing Expiration Date:   01/26/2024    Order Specific Question:   If indicated for the ordered procedure, I authorize the administration of contrast media per Radiology protocol    Answer:   Yes    Order Specific Question:   What is the patient's sedation requirement?    Answer:   No Sedation    Order Specific Question:   Does the patient have a pacemaker or implanted devices?    Answer:   No    Order Specific  Question:   Preferred imaging location?    Answer:   [facility] (table limit-550lbs)   The patient has a good understanding of the overall plan. she agrees with it. she will call with any problems that may develop before the next visit here. Total time spent: 30 mins including face to face time and time spent for planning, charting and co-ordination of care   Tamsen Meek, MD 01/26/23    I Janan Ridge am acting as a Neurosurgeon for [employer]  I have reviewed the above documentation for accuracy and completeness, and I agree with the above.

## 2023-01-31 ENCOUNTER — Telehealth: Payer: Self-pay

## 2023-01-31 NOTE — Telephone Encounter (Signed)
Pt called to ask if Md will order ibuprofen 800 mg for her as she is experiencing excruciating pain after a tooth extraction. Advised to to call her dentist as this could be a symptom of dry socket. She verbalized understanding and agreement.

## 2023-02-01 ENCOUNTER — Ambulatory Visit
Admission: RE | Admit: 2023-02-01 | Discharge: 2023-02-01 | Disposition: A | Discharge status: Home / Self Care | Payer: Medicare Other | Source: Ambulatory Visit | Attending: Gynecologic Oncology | Admitting: Gynecologic Oncology

## 2023-02-01 DIAGNOSIS — Z1231 Encounter for screening mammogram for malignant neoplasm of breast: Secondary | ICD-10-CM

## 2023-03-16 ENCOUNTER — Telehealth: Payer: Self-pay | Admitting: Oncology

## 2023-03-16 NOTE — Telephone Encounter (Signed)
Called [place] and discussed that Dr. Pricilla Holm would like her to get the MRI pelvis if we are going to continue delaying surgery.  She said she would like to reschedule the MRI.  Advised I will call her back with the appointment.  Called her back and let her know that the MRI is scheduled for 03/19/23 at 12:00 with arrival at 11:30 to [facility].  Let her know she will need to check in at the ER and can't eat or drink anything 4 hours before.  Also discussed she needs to eat a light breakfast at 7:30 am that morning and avoid caffeine and carbonated beverages.  She verbalized understanding and agreement.

## 2023-03-19 ENCOUNTER — Ambulatory Visit (HOSPITAL_COMMUNITY): Payer: Medicare Other | Attending: Gynecologic Oncology

## 2023-04-12 ENCOUNTER — Encounter: Payer: Self-pay | Admitting: Hematology and Oncology

## 2023-04-12 NOTE — Telephone Encounter (Signed)
TC

## 2023-05-03 ENCOUNTER — Encounter: Payer: Self-pay | Admitting: Hematology and Oncology

## 2023-05-03 ENCOUNTER — Telehealth: Payer: Self-pay | Admitting: *Deleted

## 2023-05-03 NOTE — Telephone Encounter (Signed)
Spoke with Gina Ross who called the office to state she needs to reschedule her MRI. According to notes patient no showed for her MRI that was scheduled for 9/21 at [facility] at 1200. Pt was given the number to radiology scheduling [phone] and advised to call the office back once she has completed her MRI. Pt verbalized understanding.

## 2023-05-09 ENCOUNTER — Ambulatory Visit (HOSPITAL_COMMUNITY): Payer: Medicare Other | Attending: Gynecologic Oncology

## 2023-05-24 ENCOUNTER — Telehealth: Payer: Self-pay | Admitting: *Deleted

## 2023-05-24 NOTE — Telephone Encounter (Signed)
Received call from pt requesting Covid 19 vaccination record be faxed to [phone].  RN successfully faxed requisition.

## 2023-06-10 ENCOUNTER — Telehealth: Payer: Self-pay | Admitting: Hematology and Oncology

## 2023-06-30 ENCOUNTER — Telehealth: Payer: Self-pay | Admitting: *Deleted

## 2023-06-30 NOTE — Telephone Encounter (Signed)
 Received VM from pt stating Tamoxifen  refill would be $70.  RN placed call to pharmacy who states pt is 17 days late picking up prescription and pt insurance is refusing to pay.  Pt able to use [employer]/  RN attempt x1 to contact pt, no answer, LVM for pt to return call to the office.

## 2023-07-15 ENCOUNTER — Encounter: Payer: Self-pay | Admitting: Hematology and Oncology

## 2023-07-18 ENCOUNTER — Ambulatory Visit (HOSPITAL_COMMUNITY): Admission: RE | Admit: 2023-07-18 | Payer: Medicare Other | Source: Ambulatory Visit

## 2023-07-25 ENCOUNTER — Encounter: Payer: Self-pay | Admitting: Hematology and Oncology

## 2023-07-25 ENCOUNTER — Telehealth: Payer: Self-pay | Admitting: *Deleted

## 2023-07-25 NOTE — Telephone Encounter (Signed)
Pt called the office requesting the phone number to reschedule her pelvic MRI,. Pt was given the radiology scheduling number [phone]. No further questions or concerns at this time.

## 2023-07-29 ENCOUNTER — Ambulatory Visit (HOSPITAL_COMMUNITY)
Admission: RE | Admit: 2023-07-29 | Discharge: 2023-07-29 | Disposition: A | Discharge status: Home / Self Care | Payer: Medicare Other | Source: Ambulatory Visit | Attending: Gynecologic Oncology | Admitting: Gynecologic Oncology

## 2023-07-29 ENCOUNTER — Encounter: Payer: Self-pay | Admitting: *Deleted

## 2023-07-29 DIAGNOSIS — D259 Leiomyoma of uterus, unspecified: Secondary | ICD-10-CM | POA: Insufficient documentation

## 2023-07-29 DIAGNOSIS — R1900 Intra-abdominal and pelvic swelling, mass and lump, unspecified site: Secondary | ICD-10-CM | POA: Diagnosis present

## 2023-07-29 DIAGNOSIS — C50811 Malignant neoplasm of overlapping sites of right female breast: Secondary | ICD-10-CM | POA: Diagnosis present

## 2023-07-29 DIAGNOSIS — Z170 Estrogen receptor positive status [ER+]: Secondary | ICD-10-CM | POA: Diagnosis present

## 2023-07-29 DIAGNOSIS — Z1502 Genetic susceptibility to malignant neoplasm of ovary: Secondary | ICD-10-CM | POA: Diagnosis present

## 2023-07-29 DIAGNOSIS — Z1509 Genetic susceptibility to other malignant neoplasm: Secondary | ICD-10-CM | POA: Insufficient documentation

## 2023-07-29 DIAGNOSIS — Z1501 Genetic susceptibility to malignant neoplasm of breast: Secondary | ICD-10-CM | POA: Diagnosis present

## 2023-07-29 MED ORDER — GADOBUTROL 1 MMOL/ML IV SOLN
7.0000 mL | Freq: Once | INTRAVENOUS | Status: AC | PRN
  Administered 2023-07-29: 7 mL via INTRAVENOUS

## 2023-08-04 ENCOUNTER — Telehealth: Payer: Self-pay | Admitting: *Deleted

## 2023-08-04 NOTE — Telephone Encounter (Signed)
-----   Message from Eleanor JONETTA Epps sent at 08/04/2023  7:22 AM EST ----- Please let the patient know the MRI below:  1. Normal postmenopausal right ovary with tiny follicular remnants measures no greater than 1.2 cm. 2. Left ovary difficult to clearly visualize but without obvious abnormality in the left adnexa. No mass or suspicious contrast enhancement to correspond to reported ultrasound findings. 3. Unchanged appearance of fibroid uterus, fibroid bulk again completely effaces the normal endometrial cavity. 4. Small volume free fluid throughout the low pelvis, unchanged.  You can arrange for follow up with Dr. Olam Mill in the office even if she wants to discuss surgery.

## 2023-08-04 NOTE — Telephone Encounter (Signed)
 Spoke with Gina Ross and relayed message from Eleanor Epps, NP of MRI results below. Pt was very pleased and thankful of results. Pt was given a follow up appt. With Dr. Rogelio for 3/5 at 0930 to discuss further surveillance and or surgery due to BRCA gene mutation. Pt agreed to appointment date and time and thanked the office for calling.

## 2023-08-16 ENCOUNTER — Encounter: Payer: Self-pay | Admitting: Hematology and Oncology

## 2023-08-31 ENCOUNTER — Encounter: Payer: Self-pay | Admitting: Obstetrics & Gynecology

## 2023-08-31 ENCOUNTER — Inpatient Hospital Stay: Attending: Obstetrics & Gynecology | Admitting: Obstetrics & Gynecology

## 2023-08-31 ENCOUNTER — Inpatient Hospital Stay: Payer: Medicare Other | Admitting: Obstetrics & Gynecology

## 2023-08-31 ENCOUNTER — Telehealth: Payer: Self-pay

## 2023-08-31 VITALS — BP 142/92 | HR 80 | Resp 18 | Ht 69.0 in | Wt 167.0 lb

## 2023-08-31 DIAGNOSIS — B029 Zoster without complications: Secondary | ICD-10-CM | POA: Diagnosis not present

## 2023-08-31 DIAGNOSIS — Z853 Personal history of malignant neoplasm of breast: Secondary | ICD-10-CM

## 2023-08-31 DIAGNOSIS — Z1509 Genetic susceptibility to other malignant neoplasm: Secondary | ICD-10-CM | POA: Insufficient documentation

## 2023-08-31 DIAGNOSIS — Z148 Genetic carrier of other disease: Secondary | ICD-10-CM | POA: Insufficient documentation

## 2023-08-31 DIAGNOSIS — Z1502 Genetic susceptibility to malignant neoplasm of ovary: Secondary | ICD-10-CM | POA: Diagnosis not present

## 2023-08-31 DIAGNOSIS — R1900 Intra-abdominal and pelvic swelling, mass and lump, unspecified site: Secondary | ICD-10-CM

## 2023-08-31 DIAGNOSIS — Z1501 Genetic susceptibility to malignant neoplasm of breast: Secondary | ICD-10-CM | POA: Diagnosis not present

## 2023-08-31 MED ORDER — VALACYCLOVIR HCL 1 G PO TABS
1000.0000 mg | ORAL_TABLET | Freq: Three times a day (TID) | ORAL | 0 refills | Status: AC
Start: 2023-08-31 — End: 2023-09-07

## 2023-08-31 NOTE — Patient Instructions (Addendum)
 Dr. Antionette Char is going to send in valtrex to treat shingles infection.   We will tentatively plan for surgery at [facility] with Dr. Antionette Char. We will call you once a date has been selected.   You may also receive a phone call from the hospital to arrange for a pre-op appointment there as well.   Preparing for your Surgery   Pre-operative Testing -You will receive a phone call from presurgical testing at [facility] to arrange for a pre-operative appointment and lab work.   -Bring your insurance card, copy of an advanced directive if applicable, medication list   -At that visit, you will be asked to sign a consent for a possible blood transfusion in case a transfusion becomes necessary during surgery.  The need for a blood transfusion is rare but having consent is a necessary part of your care.      -You should not be taking blood thinners or aspirin at least ten days prior to surgery unless instructed by your surgeon.   -Do not take supplements such as fish oil (omega 3), red yeast rice, turmeric before your surgery. You want to avoid medications with aspirin in them including headache powders such as BC or Goody's), Excedrin migraine.   Day Before Surgery at Home -You will be asked to take in a light diet the day before surgery. You will be advised you can have clear liquids up until 3 hours before your surgery.     Eat a light diet the day before surgery.  Examples including soups, broths, toast, yogurt, mashed potatoes.  AVOID GAS PRODUCING FOODS. Things to avoid include carbonated beverages (fizzy beverages, sodas), raw fruits and raw vegetables (uncooked), or beans.    If your bowels are filled with gas, your surgeon will have difficulty visualizing your pelvic organs which increases your surgical risks.   Your role in recovery Your role is to become active as soon as directed by your doctor, while still giving yourself time to heal.  Rest when  you feel tired. You will be asked to do the following in order to speed your recovery:   - Cough and breathe deeply. This helps to clear and expand your lungs and can prevent pneumonia after surgery.  - STAY ACTIVE WHEN YOU GET HOME. Do mild physical activity. Walking or moving your legs help your circulation and body functions return to normal. Do not try to get up or walk alone the first time after surgery.   -If you develop swelling on one leg or the other, pain in the back of your leg, redness/warmth in one of your legs, please call the office or go to the Emergency Room to have a doppler to rule out a blood clot. For shortness of breath, chest pain-seek care in the Emergency Room as soon as possible. - Actively manage your pain. Managing your pain lets you move in comfort. We will ask you to rate your pain on a scale of zero to 10. It is your responsibility to tell your doctor or nurse where and how much you hurt so your pain can be treated.   Special Considerations -If you are diabetic, you may be placed on insulin after surgery to have closer control over your blood sugars to promote healing and recovery.  This does not mean that you will be discharged on insulin.  If applicable, your oral antidiabetics will be resumed when you are tolerating a solid diet.   -Your final pathology  results from surgery should be available around one week after surgery and the results will be relayed to you when available.   -FMLA forms can be faxed to [phone] and please allow 5-7 business days for completion.   Pain Management After Surgery -You will be prescribed your pain medication and bowel regimen medications before surgery so that you can have these available when you are discharged from the hospital. The pain medication is for use ONLY AFTER surgery and a new prescription will not be given.    -Make sure that you have Tylenol and Ibuprofen IF YOU ARE ABLE TO TAKE THESE MEDICATIONS at home to use on  a regular basis after surgery for pain control. We recommend alternating the medications every hour to six hours since they work differently and are processed in the body differently for pain relief.   -Review the attached handout on narcotic use and their risks and side effects.    Bowel Regimen -You will be prescribed Sennakot-S to take nightly to prevent constipation especially if you are taking the narcotic pain medication intermittently.  It is important to prevent constipation and drink adequate amounts of liquids. You can stop taking this medication when you are not taking pain medication and you are back on your normal bowel routine.   Risks of Surgery Risks of surgery are low but include bleeding, infection, damage to surrounding structures, re-operation, blood clots, and very rarely death.     Blood Transfusion Information (For the consent to be signed before surgery)   We will be checking your blood type before surgery so in case of emergencies, we will know what type of blood you would need.                                             WHAT IS A BLOOD TRANSFUSION?   A transfusion is the replacement of blood or some of its parts. Blood is made up of multiple cells which provide different functions. Red blood cells carry oxygen and are used for blood loss replacement. White blood cells fight against infection. Platelets control bleeding. Plasma helps clot blood. Other blood products are available for specialized needs, such as hemophilia or other clotting disorders. BEFORE THE TRANSFUSION  Who gives blood for transfusions?  You may be able to donate blood to be used at a later date on yourself (autologous donation). Relatives can be asked to donate blood. This is generally not any safer than if you have received blood from a stranger. The same precautions are taken to ensure safety when a relative's blood is donated. Healthy volunteers who are fully evaluated to make sure their  blood is safe. This is blood bank blood. Transfusion therapy is the safest it has ever been in the practice of medicine. Before blood is taken from a donor, a complete history is taken to make sure that person has no history of diseases nor engages in risky social behavior (examples are intravenous drug use or sexual activity with multiple partners). The donor's travel history is screened to minimize risk of transmitting infections, such as malaria. The donated blood is tested for signs of infectious diseases, such as HIV and hepatitis. The blood is then tested to be sure it is compatible with you in order to minimize the chance of a transfusion reaction. If you or a relative donates blood, this is often  done in anticipation of surgery and is not appropriate for emergency situations. It takes many days to process the donated blood. RISKS AND COMPLICATIONS Although transfusion therapy is very safe and saves many lives, the main dangers of transfusion include:  Getting an infectious disease. Developing a transfusion reaction. This is an allergic reaction to something in the blood you were given. Every precaution is taken to prevent this. The decision to have a blood transfusion has been considered carefully by your caregiver before blood is given. Blood is not given unless the benefits outweigh the risks.   AFTER SURGERY INSTRUCTIONS   Return to work: 4-6 weeks if applicable   Activity: 1. Be up and out of the bed during the day.  Take a nap if needed.  You may walk up steps but be careful and use the hand rail.  Stair climbing will tire you more than you think, you may need to stop part way and rest.    2. No lifting or straining for 6 weeks over 10 pounds. No pushing, pulling, straining for 6 weeks.   3. No driving for around 1 week(s).  Do not drive if you are taking narcotic pain medicine and make sure that your reaction time has returned.    4. You can shower as soon as the next day after  surgery. Shower daily.  Use your regular soap and water (not directly on the incision) and pat your incision(s) dry afterwards; don't rub.  No tub baths or submerging your body in water until cleared by your surgeon. If you have the soap that was given to you by pre-surgical testing that was used before surgery, you do not need to use it afterwards because this can irritate your incisions.    5. No sexual activity and nothing in the vagina for 6 weeks.   6. You may experience a small amount of clear drainage from your incisions, which is normal.  If the drainage persists, increases, or changes color please call the office.   7. Do not use creams, lotions, or ointments such as neosporin on your incisions after surgery until advised by your surgeon because they can cause removal of the dermabond glue on your incisions.     8. You may experience vaginal spotting after surgery.  The spotting is normal but if you experience heavy bleeding, call our office.   9. Take Tylenol or ibuprofen first for pain if you are able to take these medications and only use narcotic pain medication for severe pain not relieved by the Tylenol or Ibuprofen.  Monitor your Tylenol intake to a max of 4,000 mg in a 24 hour period. You can alternate these medications after surgery.   Diet: 1. Low sodium Heart Healthy Diet is recommended but you are cleared to resume your normal (before surgery) diet after your procedure.   2. It is safe to use a laxative, such as Miralax or Colace, if you have difficulty moving your bowels. You will be prescribed Sennakot-S to take at bedtime every evening after surgery to keep bowel movements regular and to prevent constipation.     Wound Care: 1. Keep clean and dry.  Shower daily.   Reasons to call the Doctor: Fever - Oral temperature greater than 100.4 degrees Fahrenheit Foul-smelling vaginal discharge Difficulty urinating Nausea and vomiting Increased pain at the site of the incision  that is unrelieved with pain medicine. Difficulty breathing with or without chest pain New calf pain especially if only on one  side Sudden, continuing increased vaginal bleeding with or without clots.   Contacts: For questions or concerns you should contact:   Dr. Antionette Char at [phone]   Warner Mccreedy, NP at [phone]   After Hours: call [phone] and have the GYN Oncologist paged/contacted (after 5 pm or on the weekends).   Messages sent via mychart are for non-urgent matters and are not responded to after hours so for urgent needs, please call the after hours number.

## 2023-08-31 NOTE — Telephone Encounter (Signed)
-----   Message from Doylene Bode sent at 08/31/2023  3:45 PM EST ----- Please let her know that her surgery date will be on October 04, 2023 with Dr. Tamela Oddi. She will get a phone call closer to the date from the hospital to arrange for a preop appointment. Thanks

## 2023-08-31 NOTE — Progress Notes (Unsigned)
 Follow Up Note: Gyn-Onc  Gina Ross 53 y.o. 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  Interval History: She has a history of ER+ breast cancer and BRCA1 mutation. She is is undergoing screening, serial CA 125 and TVUS.   A CA 125 in 5/24 was 10.2.  A pelvic MRI in 2025 did not show any adnexal masses.   Review of Systems  Review of Systems  Constitutional:  Negative for malaise/fatigue and weight loss.  Respiratory:  Negative for shortness of breath and wheezing.   Cardiovascular:  Negative for chest pain and leg swelling.  Gastrointestinal:  Negative for abdominal pain, blood in stool, constipation, nausea and vomiting.  Genitourinary:  Negative for dysuria, frequency, hematuria and urgency.  Musculoskeletal:  Negative for joint pain and myalgias.  Neurological:  Negative for weakness.  Psychiatric/Behavioral:  Negative for depression. The patient does not have insomnia.    Current medications, allergy, social history, past surgical history, past medical history, family history were all reviewed.    Vitals:  There were no vitals taken for this visit.  Physical Exam:  Physical Exam Exam conducted with a chaperone present.  Constitutional:      General: She is not in acute distress. Cardiovascular:     Rate and Rhythm: Normal rate and regular rhythm.  Pulmonary:     Effort: Pulmonary effort is normal.     Breath sounds: Normal breath sounds. No wheezing or rhonchi.  Abdominal:     Palpations: Abdomen is soft.     Tenderness: There is no abdominal tenderness. There is no right CVA tenderness or left CVA tenderness.     Hernia: No hernia is present.  Genitourinary:    General: Normal vulva.     Urethra: No urethral lesion.     Vagina: No lesions. No bleeding Musculoskeletal:     Cervical back: Neck supple.     Right lower leg: No edema.     Left lower leg: No edema.  Lymphadenopathy:     Upper Body:     Right upper body: No  supraclavicular adenopathy.     Left upper body: No supraclavicular adenopathy.     Lower Body: No right inguinal adenopathy. No left inguinal adenopathy.  Skin:    Findings: No rash.  Neurological:     Mental Status: She is oriented to person, place, and time.   Assessment/Plan: *** For carriers who are of the age range of recommended rrBSO and opt not to pursue it, one can consider ovarian cancer screening. This consists of concurrent transvaginal ultrasound (preferably day 1 to 10 of menstrual cycle) and cancer antigen (CA) 125 (best performed after day 5 of menstrual cycle) every six months  The chance of detecting occult malignancy either at the time of surgery or in the final pathology and potential need for additional surgery Patients at high risk for ovarian cancer may develop primary peritoneal carcinoma and remain at risk for this cancer after rrBSO.  Antionette Char, MD

## 2023-08-31 NOTE — Progress Notes (Signed)
 Follow Up Note: Gyn-Onc  [name] 53 y.o. female  CC/HPI: Gina Ross "Gina Ross" is a 53 year old female with a history of breast cancer and BRCA1 mutation who presents for consultation regarding prophylactic oophorectomy. She is accompanied by her sister, who is a Designer, jewellery.  She is considering prophylactic oophorectomy to reduce her risk of ovarian cancer due to her BRCA1 mutation. A CA 125 in 5/24 was 10.2. A pelvic MRI in 2025 did not show any adnexal masses. She has not had any prior surgeries in her abdomen or pelvis. Her sister has had a hysterectomy.  She has a recent onset of a rash on her back, described as a bump that appeared two days ago, initially thought to be an insect bite. No other symptoms such as fever or malaise are reported.  She takes ibuprofen for knee pain following a previous knee surgery but does not take any other medications or supplements.  She works as a Lawyer and a Scientist, clinical (histocompatibility and immunogenetics) but does not engage in heavy lifting due to a previous mastectomy. She smokes marijuana, does not smoke cigarettes, and drinks alcohol occasionally, such as a glass of wine here and there. No history of high blood pressure or diabetes.   Review of Systems  Review of Systems  Constitutional:  Negative for malaise/fatigue and weight loss.  Respiratory:  Negative for shortness of breath and wheezing.   Cardiovascular:  Negative for chest pain and leg swelling.  Gastrointestinal:  Negative for abdominal pain, blood in stool, constipation, nausea and vomiting.  Genitourinary:  Negative for dysuria, frequency, hematuria and urgency.  Musculoskeletal:  Negative for joint pain and myalgias.  Neurological:  Negative for weakness.  Psychiatric/Behavioral:  Negative for depression. The patient does not have insomnia.    Current medications, allergy, social history, past surgical history, past medical history, family history were all reviewed.    Vitals:  Blood pressure (!)  142/92, pulse 80, resp. rate 18, height 5\' 9"  (1.753 m), weight 167 lb (75.8 kg), SpO2 100%.  Physical Exam:  Physical Exam Exam conducted with a chaperone present.  Constitutional:      General: She is not in acute distress. Cardiovascular:     Rate and Rhythm: Normal rate and regular rhythm.  Pulmonary:     Effort: Pulmonary effort is normal.     Breath sounds: Normal breath sounds. No wheezing or rhonchi.  Abdominal:     Palpations: Abdomen is soft.     Tenderness: There is no abdominal tenderness. There is no right CVA tenderness or left CVA tenderness.     Hernia: No hernia is present.  Genitourinary:    General: Normal vulva.     Urethra: No urethral lesion.     Vagina: No lesions. No bleeding  Uterus: irregular contour Musculoskeletal:     Cervical back: Neck supple.     Right lower leg: No edema.     Left lower leg: No edema.  Lymphadenopathy:     Upper Body:     Right upper body: No supraclavicular adenopathy.     Left upper body: No supraclavicular adenopathy.     Lower Body: No right inguinal adenopathy. No left inguinal adenopathy.  Skin:    Findings: No rash.  Neurological:     Mental Status: She is oriented to person, place, and time.   Assessment/Plan:  BRCA1 mutation and history of breast cancer Discussed prophylactic bilateral salpingo-oophorectomy to reduce risk of ovarian cancer. Explained surgical procedure, potential complications, and  postoperative recovery. Verbal consent was obtained for these procedures.  We reviewed the patient's specific anatomic and functional findings with her, with the assistance of diagrams and handouts.  We reviewed the treatment options including expectant, conservative, medical, and surgical management and she is opting for surgical management.  We reviewed the benefits and risks of each treatment option.   For all procedures, we discussed risks of bleeding, infection, damage to surrounding organs including bowel, bladder,  blood vessels, ureters,  numbness and weakness at any body site and the rarer risks of blood clot, heart attack, pneumonia, death.   We also discussed the possible conversion to laparoscopy or laparotomy.  The discussion included the chance of detecting occult malignancy either at the time of surgery or in the final pathology and potential need for additional surgery. Patients at high risk for ovarian cancer may develop primary peritoneal carcinoma and remain at risk for this cancer after rrBSO. All her questions were answered and she verbalized understanding.    -Stop ibuprofen three days before surgery. -Adhere to light diet and hydration instructions the day before surgery.  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 -Prescribe antiviral medication. -Recommend follow-up with primary care doctor for confirmation of diagnosis.  I personally spent 30 minutes face-to-face and non-face-to-face in the care of this patient, which includes all pre, intra, and post visit time on the date of service.    Antionette Char, MD

## 2023-08-31 NOTE — Telephone Encounter (Signed)
 Pt is aware of surgery date being 10/04/23 with Dr.Jackson Christell Constant. Also aware Dr.Gudena's recommendation of stopping the Tamoxifen 1 week before and 1 week after surgery. She states she is currently not taking it d/t insurance not paying for it. She is working on starting it back soon.   Warner Mccreedy NP notified pt agrees to surgery date.

## 2023-08-31 NOTE — Telephone Encounter (Signed)
-----   Message from Doylene Bode sent at 08/31/2023  4:00 PM EST ----- Regarding: 2nd part to message Also please let her know Gudena usually says one week off tamoxifen before and after surgery.

## 2023-09-01 ENCOUNTER — Other Ambulatory Visit: Payer: Self-pay | Admitting: Gynecologic Oncology

## 2023-09-01 DIAGNOSIS — Z1501 Genetic susceptibility to malignant neoplasm of breast: Secondary | ICD-10-CM

## 2023-09-06 ENCOUNTER — Other Ambulatory Visit: Payer: Medicare Other

## 2023-09-07 ENCOUNTER — Telehealth: Payer: Self-pay | Admitting: Hematology and Oncology

## 2023-09-07 ENCOUNTER — Encounter: Payer: Self-pay | Admitting: Hematology and Oncology

## 2023-09-07 ENCOUNTER — Telehealth: Payer: Medicare Other | Admitting: Hematology and Oncology

## 2023-09-07 ENCOUNTER — Other Ambulatory Visit (HOSPITAL_COMMUNITY): Payer: Self-pay

## 2023-09-07 ENCOUNTER — Inpatient Hospital Stay (HOSPITAL_BASED_OUTPATIENT_CLINIC_OR_DEPARTMENT_OTHER): Payer: Medicare Other | Admitting: Hematology and Oncology

## 2023-09-07 DIAGNOSIS — C50811 Malignant neoplasm of overlapping sites of right female breast: Secondary | ICD-10-CM

## 2023-09-07 DIAGNOSIS — Z170 Estrogen receptor positive status [ER+]: Secondary | ICD-10-CM

## 2023-09-07 MED ORDER — TAMOXIFEN CITRATE 20 MG PO TABS
20.0000 mg | ORAL_TABLET | Freq: Every day | ORAL | 3 refills | Status: DC
Start: 2023-09-07 — End: 2024-04-19
  Filled 2023-09-07: qty 90, 90d supply, fill #0

## 2023-09-07 NOTE — Assessment & Plan Note (Signed)
 08/17/19: Patient palpated a right breast mass and right axilla mass and noted skin redness x2 weeks. She went to the ED and had an abscess drained with no improvement. Diagnostic mammogram and US showed a 7.3cm right breast mass extending into all 4 quadrants and partially into overlying skin with 12 abnormal right axillary lymph nodes. Biopsy  showed IDC with necrosis in the breast and axilla, grade 3, HER-2 equivocal by IHC, negative by FISH, ER+ 40% weak, PR -, Ki67 90%.   Treatment plan: 1. Neoadjuvant chemotherapy with Adriamycin and Cytoxan dose dense 4 followed by Taxol weekly 8 stopped after cycle 8 for neuropathy 2. 01/24/2020: Right mastectomy: No residual cancer identified, 0/5 lymph nodes negative treatment effect in the breast and the lymph nodes.  ER 40% weak staining, PR negative, HER-2 negative, Ki-67 90%, complete pathologic response 3. Followed by adjuvant radiation therapy completed 05/05/2020 4.  Follow-up adjuvant antiestrogen therapy   CT CAP 08/31/2019: Very large central right breast mass, bulky right axillary and subpectoral lymph nodes.  No evidence of distant metastatic disease.  Bulky uterine fibroids Bone scan 08/31/2019: No metastatic disease in the bones ---------------------------------------------------------------------------------------------------------------------------------------------------- Current Treatment: Tamoxifen 20 mg daily started 06/29/19.   Tamoxifen Toxicities: Tolerating it well without any major problems.     Breast cancer Surveillance: 1. Breast Exam: 01/26/2023: Benign 2. Mammogram: 02/02/2023: Benign breast density category C 3.  Breast MRI 08/27/2021: Indeterminate 7 mm left breast mass, although repeat MRI did not show any mass and therefore no biopsy was done. She missed doing her MRI this year.  We will set her up for MRI next year.   GYN oncology is seeing the patient for BSO   She is seeing Dr. Leta Baptist for breast reconstruction.

## 2023-09-07 NOTE — Progress Notes (Signed)
 HEMATOLOGY-ONCOLOGY TELEPHONE VISIT PROGRESS NOTE  I connected with our patient on 09/07/23 at  9:00 AM EDT by telephone and verified that I am speaking with the correct person using two identifiers.  I discussed the limitations, risks, security and privacy concerns of performing an evaluation and management service by telephone and the availability of in person appointments.  I also discussed with the patient that there may be a patient responsible charge related to this service. The patient expressed understanding and agreed to proceed.   History of Present Illness: Follow-up on tamoxifen, BRCA1 mutation  History of Present Illness The patient, with a history of fibroid uterus and a gene predisposing to breast cancer, presents for follow-up after a recent MRI of the pelvis. The MRI showed no mass, and the patient is scheduled for a hysterectomy in April, which will include removal of the uterus and ovaries. The patient also reports a recent illness, described as a virus or possible food poisoning, which caused significant discomfort and inability to move.  The patient was scheduled for a breast MRI due to her genetic predisposition to breast cancer, but had to reschedule due to her recent illness.  The patient also reports difficulty obtaining her prescribed tamoxifen due to Medicare coverage issues. She has not been taking the medication for about two weeks and plans to pick up a refill at a more affordable pharmacy.    Oncology History  Malignant neoplasm of overlapping sites of right breast in female, estrogen receptor positive (HCC)  08/17/2019 Initial Diagnosis   Patient palpated a right breast mass and right axilla mass and noted skin redness x2 weeks. She went to the ED and had an abscess drained with no improvement. Diagnostic mammogram and US showed a 7.3cm right breast mass extending into all 4 quadrants and partially into overlying skin with 12 abnormal right axillary lymph nodes.  Biopsy  showed IDC with necrosis in the breast and axilla, grade 3, HER-2 equivocal by IHC, negative by FISH, ER+ 40% weak, PR -, Ki67 90%.    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 The report date is 09/03/2019.  The Common Hereditary Cancers Panel offered by [facility] includes sequencing and/or deletion duplication testing of the following 48 genes: APC, ATM, AXIN2, BARD1, BMPR1A, BRCA1, BRCA2, BRIP1, CDH1, CDK4, CDKN2A (p14ARF), CDKN2A (p16INK4a), CHEK2, CTNNA1, DICER1, EPCAM (Deletion/duplication testing only), GREM1 (promoter region deletion/duplication testing only), KIT, MEN1, MLH1, MSH2, MSH3, MSH6, MUTYH, NBN, NF1, NHTL1, PALB2, PDGFRA, PMS2, POLD1, POLE, PTEN, RAD50, RAD51C, RAD51D, RNF43, SDHB, SDHC, SDHD, SMAD4, SMARCA4. STK11, TP53, TSC1, TSC2, and VHL.  The following genes were evaluated for sequence changes only: SDHA and HOXB13 c.251G>A variant only.    09/04/2019 - 12/18/2019 Neo-Adjuvant Chemotherapy   Adriamycin and Cytoxan x 4 (09/04/2019 - 10/16/2019 Taxol x 8 (10/30/2019 - 12/18/2019), stopped for neuropathy   01/24/2020 Surgery   Right mastectomy Dwain Sarna) [phone]): no residual carcinoma, 5 lymph nodes negative for carcinoma.   03/25/2020 - 05/05/2020 Radiation Therapy   The patient initially received a dose of 50 Gy in 25 fractions to the breast and right supraclavicular area using whole-breast tangent fields. This was delivered using a 3-D conformal technique. The pt received a boost delivering an additional 10 Gy in 5 fractions using a electron boost with electrons. The total dose was 60 Gy.   06/2020 -  Anti-estrogen oral therapy   Tamoxifen.  1/2 tablet daily for a month, then increase to 20 mg daily.     REVIEW OF SYSTEMS:   Constitutional: Denies fevers, chills or abnormal weight loss All other systems were reviewed with  the patient and are negative. Observations/Objective:     Assessment Plan:  Malignant neoplasm of overlapping sites of right breast in female, estrogen receptor positive (HCC) 08/17/19: Patient palpated a right breast mass and right axilla mass and noted skin redness x2 weeks. She went to the ED and had an abscess drained with no improvement. Diagnostic mammogram and US showed a 7.3cm right breast mass extending into all 4 quadrants and partially into overlying skin with 12 abnormal right axillary lymph nodes. Biopsy  showed IDC with necrosis in the breast and axilla, grade 3, HER-2 equivocal by IHC, negative by FISH, ER+ 40% weak, PR -, Ki67 90%. BRCA1 gene mutation positive  Treatment plan: 1. Neoadjuvant chemotherapy with Adriamycin and Cytoxan dose dense 4 followed by Taxol weekly 8 stopped after cycle 8 for neuropathy 2. 01/24/2020: Right mastectomy: No residual cancer identified, 0/5 lymph nodes negative treatment effect in the breast and the lymph nodes.  ER 40% weak staining, PR negative, HER-2 negative, Ki-67 90%, complete pathologic response 3. Followed by adjuvant radiation therapy completed 05/05/2020 4.  Follow-up adjuvant antiestrogen therapy   CT CAP 08/31/2019: Very large central right breast mass, bulky right axillary and subpectoral lymph nodes.  No evidence of distant metastatic disease.  Bulky uterine fibroids Bone scan 08/31/2019: No metastatic disease in the bones ---------------------------------------------------------------------------------------------------------------------------------------------------- Current Treatment: Tamoxifen 20 mg daily started 06/29/19.   Tamoxifen Toxicities: Tolerating it well without any major problems.     Breast cancer Surveillance: 1. Breast Exam: 01/26/2023: Benign 2. Mammogram: 02/02/2023: Benign breast density category C 3.  Breast MRI 08/27/2021: Indeterminate 7 mm left breast mass, although repeat MRI did not show any mass and therefore no  biopsy was done.  She had a breast MRI appointment on 09/06/2023 but she canceled it and will reschedule it.   GYN oncology is seeing the patient for BSO and TAH (surgery in April 2025)   She is seeing Dr. Leta Baptist for breast reconstruction.     Assessment & Plan Malignant neoplasm of overlapping sites of right breast, estrogen receptor positive Estrogen receptor-positive breast cancer on hold from tamoxifen due to Medicare issues. Resuming tamoxifen is crucial due to genetic predisposition. Pending breast MRI. - Send tamoxifen refill to [facility] outpatient pharmacy for immediate pickup. - Ensure immediate resumption of tamoxifen therapy. - Reschedule and complete breast MRI.  Uterine fibroids Scheduled for hysterectomy in April to alleviate symptoms and reduce complications. - Proceed with scheduled hysterectomy in April.  Follow-up Annual monitoring planned unless earlier intervention required. - Schedule follow-up appointment for next year.      I discussed the assessment and treatment plan with the patient. The patient was provided an opportunity to ask questions and all were answered. The patient agreed with the plan and demonstrated an understanding of the instructions. The patient was advised to call back or seek an in-person evaluation if the symptoms worsen or if the condition fails to improve as anticipated.   I provided 20 minutes of non-face-to-face time during this encounter.  This includes time for charting and coordination of care   Tamsen Meek, MD

## 2023-09-07 NOTE — Telephone Encounter (Signed)
Scheduled appointment per 3/11 los. Patient is aware of the made appointment.

## 2023-09-19 ENCOUNTER — Other Ambulatory Visit (HOSPITAL_COMMUNITY): Payer: Self-pay

## 2023-09-20 ENCOUNTER — Other Ambulatory Visit (HOSPITAL_COMMUNITY): Payer: Self-pay

## 2023-09-21 ENCOUNTER — Ambulatory Visit: Admitting: Obstetrics & Gynecology

## 2023-09-23 ENCOUNTER — Encounter: Payer: Self-pay | Admitting: Hematology and Oncology

## 2023-09-29 ENCOUNTER — Encounter: Payer: Self-pay | Admitting: Hematology and Oncology

## 2023-09-29 NOTE — Patient Instructions (Signed)
 DUE TO COVID-19 ONLY TWO VISITORS  (aged 54 and older)  ARE ALLOWED TO COME WITH YOU AND STAY IN THE WAITING ROOM ONLY DURING PRE OP AND PROCEDURE.   **NO VISITORS ARE ALLOWED IN THE SHORT STAY AREA OR RECOVERY ROOM!!**  IF YOU WILL BE ADMITTED INTO THE HOSPITAL YOU ARE ALLOWED ONLY FOUR SUPPORT PEOPLE DURING VISITATION HOURS ONLY (7 AM -8PM)   The support person(s) must pass our screening, gel in and out, and wear a mask at all times, including in the patient's room. Patients must also wear a mask when staff or their support person are in the room. Visitors GUEST BADGE MUST BE WORN VISIBLY  One adult visitor may remain with you overnight and MUST be in the room by 8 P.M.     Your procedure is scheduled on: 10/04/23   Report to [facility] Main Entrance    Report to admitting at : 5:15 AM   Call this number if you have problems the morning of surgery [phone]   Do not eat food :After Midnight.   Eat a light diet the day before surgery.  Examples including soups, broths, toast, yogurt, mashed potatoes.  Things to avoid include carbonated beverages (fizzy beverages), raw fruits and raw vegetables, or beans.   If your bowels are filled with gas, your surgeon will have difficulty visualizing your pelvic organs which increases your surgical risks.  After Midnight you may have the following liquids until : 4:30 AM DAY OF SURGERY  Water Black Coffee (sugar ok, NO MILK/CREAM OR CREAMERS)  Tea (sugar ok, NO MILK/CREAM OR CREAMERS) regular and decaf                             Plain Jell-O (NO RED)                                           Fruit ices (not with fruit pulp, NO RED)                                     Popsicles (NO RED)                                                                  Juice: apple, WHITE grape, WHITE cranberry Sports drinks like Gatorade (NO RED)              FOLLOW BOWEL PREP AND ANY ADDITIONAL PRE OP INSTRUCTIONS YOU RECEIVED FROM YOUR SURGEON'S  OFFICE!!!   Oral Hygiene is also important to reduce your risk of infection.                                    Remember - BRUSH YOUR TEETH THE MORNING OF SURGERY WITH YOUR REGULAR TOOTHPASTE  DENTURES WILL BE REMOVED PRIOR TO SURGERY PLEASE DO NOT APPLY "Poly grip" OR ADHESIVES!!!   Do NOT smoke after Midnight   Take these medicines the morning of surgery with  A SIP OF WATER:NONE   DO NOT TAKE ANY ORAL DIABETIC MEDICATIONS DAY OF YOUR SURGERY  STOP TAKING all Vitamins, Herbs and supplements 1 week before your surgery.                               You may not have any metal on your body including hair pins, jewelry, and body piercing             Do not wear make-up, lotions, powders, perfumes/cologne, or deodorant  Do not wear nail polish including gel and S&S, artificial/acrylic nails, or any other type of covering on natural nails including finger and toenails. If you have artificial nails, gel coating, etc. that needs to be removed by a nail salon please have this removed prior to surgery or surgery may need to be canceled/ delayed if the surgeon/ anesthesia feels like they are unable to be safely monitored.   Do not shave  48 hours prior to surgery.    Do not bring valuables to the hospital.  IS NOT             RESPONSIBLE   FOR VALUABLES.   Contacts, glasses, or bridgework may not be worn into surgery.   Bring small overnight bag day of surgery.   DO NOT BRING YOUR HOME MEDICATIONS TO THE HOSPITAL. PHARMACY WILL DISPENSE MEDICATIONS LISTED ON YOUR MEDICATION LIST TO YOU DURING YOUR ADMISSION IN THE HOSPITAL!    Patients discharged on the day of surgery will not be allowed to drive home.  Someone NEEDS to stay with you for the first 24 hours after anesthesia.   Special Instructions: Bring a copy of your healthcare power of attorney and living will documents         the day of surgery if you haven't scanned them before.              Please read over the following  fact sheets you were given: IF YOU HAVE QUESTIONS ABOUT YOUR PRE-OP INSTRUCTIONS PLEASE CALL [phone]    [facility] - Preparing for Surgery Before surgery, you can play an important role.  Because skin is not sterile, your skin needs to be as free of germs as possible.  You can reduce the number of germs on your skin by washing with CHG (chlorahexidine gluconate) soap before surgery.  CHG is an antiseptic cleaner which kills germs and bonds with the skin to continue killing germs even after washing. Please DO NOT use if you have an allergy to CHG or antibacterial soaps.  If your skin becomes reddened/irritated stop using the CHG and inform your nurse when you arrive at Short Stay. Do not shave (including legs and underarms) for at least 48 hours prior to the first CHG shower.  You may shave your face/neck. Please follow these instructions carefully:  1.  Shower with CHG Soap the night before surgery and the  morning of Surgery.  2.  If you choose to wash your hair, wash your hair first as usual with your  normal  shampoo.  3.  After you shampoo, rinse your hair and body thoroughly to remove the  shampoo.                           4.  Use CHG as you would any other liquid soap.  You can apply chg directly  to the skin  and wash                       Gently with a scrungie or clean washcloth.  5.  Apply the CHG Soap to your body ONLY FROM THE NECK DOWN.   Do not use on face/ open                           Wound or open sores. Avoid contact with eyes, ears mouth and genitals (private parts).                       Wash face,  Genitals (private parts) with your normal soap.             6.  Wash thoroughly, paying special attention to the area where your surgery  will be performed.  7.  Thoroughly rinse your body with warm water from the neck down.  8.  DO NOT shower/wash with your normal soap after using and rinsing off  the CHG Soap.                9.  Pat yourself dry with a clean towel.             10.  Wear clean pajamas.            11.  Place clean sheets on your bed the night of your first shower and do not  sleep with pets. Day of Surgery : Do not apply any lotions/deodorants the morning of surgery.  Please wear clean clothes to the hospital/surgery center.  FAILURE TO FOLLOW THESE INSTRUCTIONS MAY RESULT IN THE CANCELLATION OF YOUR SURGERY PATIENT SIGNATURE_________________________________  NURSE SIGNATURE__________________________________  ________________________________________________________________________ WHAT IS A BLOOD TRANSFUSION? Blood Transfusion Information  A transfusion is the replacement of blood or some of its parts. Blood is made up of multiple cells which provide different functions. Red blood cells carry oxygen and are used for blood loss replacement. White blood cells fight against infection. Platelets control bleeding. Plasma helps clot blood. Other blood products are available for specialized needs, such as hemophilia or other clotting disorders. BEFORE THE TRANSFUSION  Who gives blood for transfusions?  Healthy volunteers who are fully evaluated to make sure their blood is safe. This is blood bank blood. Transfusion therapy is the safest it has ever been in the practice of medicine. Before blood is taken from a donor, a complete history is taken to make sure that person has no history of diseases nor engages in risky social behavior (examples are intravenous drug use or sexual activity with multiple partners). The donor's travel history is screened to minimize risk of transmitting infections, such as malaria. The donated blood is tested for signs of infectious diseases, such as HIV and hepatitis. The blood is then tested to be sure it is compatible with you in order to minimize the chance of a transfusion reaction. If you or a relative donates blood, this is often done in anticipation of surgery and is not appropriate for emergency situations. It takes many  days to process the donated blood. RISKS AND COMPLICATIONS Although transfusion therapy is very safe and saves many lives, the main dangers of transfusion include:  Getting an infectious disease. Developing a transfusion reaction. This is an allergic reaction to something in the blood you were given. Every precaution is taken to prevent this. The decision to have a blood transfusion has been considered  carefully by your caregiver before blood is given. Blood is not given unless the benefits outweigh the risks. AFTER THE TRANSFUSION Right after receiving a blood transfusion, you will usually feel much better and more energetic. This is especially true if your red blood cells have gotten low (anemic). The transfusion raises the level of the red blood cells which carry oxygen, and this usually causes an energy increase. The nurse administering the transfusion will monitor you carefully for complications. HOME CARE INSTRUCTIONS  No special instructions are needed after a transfusion. You may find your energy is better. Speak with your caregiver about any limitations on activity for underlying diseases you may have. SEEK MEDICAL CARE IF:  Your condition is not improving after your transfusion. You develop redness or irritation at the intravenous (IV) site. SEEK IMMEDIATE MEDICAL CARE IF:  Any of the following symptoms occur over the next 12 hours: Shaking chills. You have a temperature by mouth above 102 F (38.9 C), not controlled by medicine. Chest, back, or muscle pain. People around you feel you are not acting correctly or are confused. Shortness of breath or difficulty breathing. Dizziness and fainting. You get a rash or develop hives. You have a decrease in urine output. Your urine turns a dark color or changes to pink, red, or brown. Any of the following symptoms occur over the next 10 days: You have a temperature by mouth above 102 F (38.9 C), not controlled by medicine. Shortness of  breath. Weakness after normal activity. The white part of the eye turns yellow (jaundice). You have a decrease in the amount of urine or are urinating less often. Your urine turns a dark color or changes to pink, red, or brown. Document Released: 06/11/2000 Document Revised: 09/06/2011 Document Reviewed: 01/29/2008 [facility] Patient Information 2014 [place], [place].  _______________________________________________________________________

## 2023-09-30 ENCOUNTER — Encounter (HOSPITAL_COMMUNITY)
Admission: RE | Admit: 2023-09-30 | Discharge: 2023-09-30 | Disposition: A | Discharge status: Home / Self Care | Source: Ambulatory Visit | Attending: Anesthesiology | Admitting: Anesthesiology

## 2023-09-30 NOTE — Progress Notes (Signed)
 Pt. Was called because she did not show up for PST appointment at 10:00 AM. As per pt. She forgot about her appointment,she will be reschedule.

## 2023-10-02 ENCOUNTER — Other Ambulatory Visit

## 2023-10-03 ENCOUNTER — Telehealth: Payer: Self-pay

## 2023-10-03 NOTE — Telephone Encounter (Signed)
 Per Warner Mccreedy NP, I reached out to Gina Ross regarding upcoming surgery for tomorrow with Dr.Jackson-Moore. (Pt told pre-admit she needs to reschedule the surgery).   Gina Ross reports needing to cancel surgery d/t having signed up for classes that have already been paid for. She states she really needs to take the classes and has an exam on 4/25. Pt is aware cancellation will be discussed with the providers and surgery will not be rescheduled at this time. Our office will call her once it can be added back on. She voiced an understanding.

## 2023-10-03 NOTE — Progress Notes (Signed)
 Phoned patient to complete PAT and she stated that she is rescheduling surgery because she is in a class that she needs to complete.  She stated that she left a message at the surgeon's office and I will notify them as well.

## 2023-10-04 ENCOUNTER — Encounter (HOSPITAL_COMMUNITY): Admission: RE | Payer: Self-pay | Source: Ambulatory Visit

## 2023-10-04 ENCOUNTER — Ambulatory Visit (HOSPITAL_COMMUNITY): Admission: RE | Admit: 2023-10-04 | Source: Ambulatory Visit | Admitting: Obstetrics & Gynecology

## 2023-10-04 DIAGNOSIS — Z1501 Genetic susceptibility to malignant neoplasm of breast: Secondary | ICD-10-CM

## 2023-10-04 DIAGNOSIS — C50911 Malignant neoplasm of unspecified site of right female breast: Secondary | ICD-10-CM

## 2023-10-04 SURGERY — SALPINGO-OOPHORECTOMY, ROBOT-ASSISTED
Anesthesia: General | Laterality: Bilateral

## 2023-11-06 ENCOUNTER — Ambulatory Visit
Admission: RE | Admit: 2023-11-06 | Discharge: 2023-11-06 | Disposition: A | Discharge status: Home / Self Care | Source: Ambulatory Visit | Attending: Hematology and Oncology | Admitting: Hematology and Oncology

## 2023-11-06 DIAGNOSIS — C50811 Malignant neoplasm of overlapping sites of right female breast: Secondary | ICD-10-CM

## 2023-11-06 MED ORDER — GADOPICLENOL 0.5 MMOL/ML IV SOLN: 7.0000 mL | Freq: Once | INTRAVENOUS | Status: DC | PRN

## 2023-11-07 ENCOUNTER — Other Ambulatory Visit: Payer: Self-pay

## 2023-11-07 ENCOUNTER — Ambulatory Visit
Admission: EM | Admit: 2023-11-07 | Discharge: 2023-11-07 | Disposition: A | Discharge status: Home / Self Care | Attending: Family Medicine | Admitting: Family Medicine

## 2023-11-07 DIAGNOSIS — R21 Rash and other nonspecific skin eruption: Secondary | ICD-10-CM | POA: Diagnosis not present

## 2023-11-07 MED ORDER — TRIAMCINOLONE ACETONIDE 0.1 % EX CREA: 1.0000 | TOPICAL_CREAM | Freq: Two times a day (BID) | CUTANEOUS | 0 refills | Status: AC

## 2023-11-07 NOTE — ED Provider Notes (Signed)
 [facility]    CSN: [id number] Arrival date & time: 11/07/23  1442      History   Chief Complaint No chief complaint on file.   HPI Gina Ross is a 53 y.o. female presents for rash.  Patient reports 2 to 3 weeks of a pruritic rash on her left upper chest, her right torso.  States she was treated for shingles by her OB/GYN in October on her left lower back with resolution.  She denies any fevers, chills.  No new contacts including soaps, detergents, medications, etc.  She does have a history of eczema but states this is not consistent with her typical eczema.  She has not been using any OTC treatments for symptoms since onset.  No other concerns at this time.  HPI  Past Medical History:  Diagnosis Date   Asthma    as a child   BRCA1 gene mutation positive in female    Breast cancer [facility])    Family history of breast cancer    Family history of colon cancer    Family history of prostate cancer    History of radiation therapy 03/25/2020-05/05/2020   IMRT to right breast     Dr Retta Caster   Personal history of chemotherapy    Personal history of radiation therapy     Patient Active Problem List   Diagnosis Date Noted   Breast cancer, right (HCC) 01/24/2020   Port-A-Cath in place 09/18/2019   BRCA1 gene mutation positive in female 09/05/2019   Genetic testing 09/05/2019   Family history of breast cancer    Family history of prostate cancer    Family history of colon cancer    Malignant neoplasm of overlapping sites of right breast in female, estrogen receptor positive (HCC) 08/17/2019   Unilateral primary osteoarthritis, right knee 01/31/2017    Past Surgical History:  Procedure Laterality Date   COLONOSCOPY     HARDWARE REMOVAL Right 09/11/2014   Procedure: Removal Deep Hardware Right Knee;  Surgeon: Timothy Ford, MD;  Location: [facility] OR;  Service: Orthopedics;  Laterality: Right;   IR IMAGING GUIDED PORT INSERTION  09/04/2019   MASTECTOMY WITH AXILLARY  LYMPH NODE DISSECTION Right 01/24/2020   Procedure: RIGHT MASTECTOMY WITH AXILLARY LYMPH NODE DISSECTION;  Surgeon: Enid Harry, MD;  Location: [facility] OR;  Service: General;  Laterality: Right;  PEC BLOCK   ORIF PATELLA Right 03/22/2014   Procedure: Open Reduction Internal Fixation Right Patella ;  Surgeon: Timothy Ford, MD;  Location[facility] OR;  Service: Orthopedics;  Laterality: Right;    OB History     Gravida  1   Para  1   Term      Preterm      AB      Living         SAB      IAB      Ectopic      Multiple      Live Births               Home Medications    Prior to Admission medications   Medication Sig Start Date End Date Taking? Authorizing Provider  triamcinolone  cream (KENALOG ) 0.1 % Apply 1 Application topically 2 (two) times daily. 11/07/23  Yes Bobette Leyh, Jodi R, NP  ibuprofen  (ADVIL ) 200 MG tablet Take 400 mg by mouth every 8 (eight) hours as needed (pain.).    [provider]  loratadine  (CLARITIN ) 10 MG tablet Take 10 mg  by mouth daily as needed for allergies.    [provider]  naphazoline-pheniramine (ALLERGY EYE) 0.025-0.3 % ophthalmic solution 1-2 drops 3 (three) times daily as needed for eye irritation or allergies.    [provider]  tamoxifen  (NOLVADEX ) 20 MG tablet Take 1 tablet (20 mg total) by mouth daily. 09/07/23   Cameron Cea, MD    Family History Family History  Problem Relation Age of Onset   Hypertension Mother    Diabetes Mother    Breast cancer Mother        dx. 40s   Hypertension Father    Colon cancer Paternal Aunt        dx. 30s   Breast cancer Maternal Grandmother        bilateral   Cervical cancer Maternal Grandmother        tumor on cervix   Prostate cancer Maternal Uncle        dx. 50s   Breast cancer Cousin        dx. late 30s   Breast cancer Cousin        dx. <50   Pancreatic cancer Neg Hx    Endometrial cancer Neg Hx     Social History Social History   Tobacco Use   Smoking  status: Former    Current packs/day: 0.10    Types: Cigarettes   Smokeless tobacco: Never   Tobacco comments:    marijuana  Vaping Use   Vaping status: Never Used  Substance Use Topics   Alcohol use: Yes    Comment: socailly   Drug use: Yes    Types: Marijuana     Allergies   Penicillins   Review of Systems Review of Systems  Skin:  Positive for rash.     Physical Exam Triage Vital Signs ED Triage Vitals  Encounter Vitals Group     BP 11/07/23 1536 (!) 128/92     Systolic BP Percentile --      Diastolic BP Percentile --      Pulse Rate 11/07/23 1536 83     Resp 11/07/23 1536 16     Temp 11/07/23 1536 98 F (36.7 C)     Temp Source 11/07/23 1536 Oral     SpO2 11/07/23 1536 96 %     Weight --      Height --      Head Circumference --      Peak Flow --      Pain Score 11/07/23 1535 0     Pain Loc --      Pain Education --      Exclude from Growth Chart --    No data found.  Updated Vital Signs BP (!) 128/92   Pulse 83   Temp 98 F (36.7 C) (Oral)   Resp 16   SpO2 96%   Visual Acuity Right Eye Distance:   Left Eye Distance:   Bilateral Distance:    Right Eye Near:   Left Eye Near:    Bilateral Near:     Physical Exam Vitals and nursing note reviewed.  Constitutional:      General: She is not in acute distress.    Appearance: Normal appearance. She is not ill-appearing.  HENT:     Head: Normocephalic and atraumatic.  Eyes:     Pupils: Pupils are equal, round, and reactive to light.  Cardiovascular:     Rate and Rhythm: Normal rate.  Pulmonary:     Effort: Pulmonary effort  is normal.  Skin:    Comments: There is a nonerythematous macular rash on the left upper chest chest distal to the clavicle.  There is no vesicles, lesions, swelling, warmth, drainage.  There is also a small patch on her right torso.  Neurological:     General: No focal deficit present.     Mental Status: She is alert and oriented to person, place, and time.   Psychiatric:        Mood and Affect: Mood normal.        Behavior: Behavior normal.      UC Treatments / Results  Labs (all labs ordered are listed, but only abnormal results are displayed) Labs Reviewed  VARICELLA-ZOSTER BY PCR    EKG   Radiology No results found.  Procedures Procedures (including critical care time)  Medications Ordered in UC Medications - No data to display  Initial Impression / Assessment and Plan / UC Course  I have reviewed the triage vital signs and the nursing notes.  Pertinent labs & imaging results that were available during my care of the patient were reviewed by me and considered in my medical decision making (see chart for details).     Reviewed exam and symptoms with patient.  No red flags.  Patient concerned for shingles recurrence.  Discussed presentation is not typical for shingles but given her concern we will do a PCR swab and contact her for positive results.  She prefers to await results before initiating any antiviral treatment.  Will do topical triamcinolone  to treat dermatitis.  Advised PCP follow-up if symptoms do not improve.  ER precautions reviewed and patient verbalized understanding. Final Clinical Impressions(s) / UC Diagnoses   Final diagnoses:  Rash and nonspecific skin eruption     Discharge Instructions      Clinic will contact you with results of the swab done today if positive.  Start triamcinolone  topically twice daily to the affected areas as needed.  Follow-up with your PCP if your symptoms do not improve.  Please go to the ER for any worsening symptoms.  Hope you feel better soon!  ED Prescriptions     Medication Sig Dispense Auth. Provider   triamcinolone  cream (KENALOG ) 0.1 % Apply 1 Application topically 2 (two) times daily. 45 g Acasia Skilton, Jodi R, NP      PDMP not reviewed this encounter.   Alleen Arbour, NP 11/07/23 [phone]

## 2023-11-07 NOTE — Discharge Instructions (Addendum)
 Clinic will contact you with results of the swab done today if positive.  Start triamcinolone  topically twice daily to the affected areas as needed.  Follow-up with your PCP if your symptoms do not improve.  Please go to the ER for any worsening symptoms.  Hope you feel better soon!

## 2023-11-07 NOTE — ED Triage Notes (Signed)
 Pt has dark bumps on left side of her backx3wks and left chestx2wks. Pt states her gynecologist told her that it was shingles and gave her the medicine for it a month ago. Pt states she now has dark bumps on right flank area where she had a JP drain. Pt states al the areas are itching still

## 2023-11-09 ENCOUNTER — Telehealth: Payer: Self-pay

## 2023-11-09 NOTE — Telephone Encounter (Signed)
-----   Message from Suellyn Emory sent at 11/09/2023  2:05 PM EDT ----- This is the patient that cancelled last min with JM. Would reach back out and arrange for a follow up appt with Dr. Nydia Belfast to regroup. No rush

## 2023-11-09 NOTE — Telephone Encounter (Signed)
 Gina Ross is scheduled with Dr.Jackson-Moore on 5/28 @ 10:00.

## 2023-11-10 LAB — VARICELLA-ZOSTER BY PCR: Varicella-Zoster, PCR: NEGATIVE

## 2023-11-16 ENCOUNTER — Encounter: Payer: Self-pay | Admitting: Obstetrics & Gynecology

## 2023-11-17 ENCOUNTER — Other Ambulatory Visit

## 2023-11-23 ENCOUNTER — Inpatient Hospital Stay: Attending: Obstetrics & Gynecology

## 2023-11-23 ENCOUNTER — Inpatient Hospital Stay: Admitting: Gynecologic Oncology

## 2023-11-23 ENCOUNTER — Encounter: Payer: Self-pay | Admitting: Obstetrics & Gynecology

## 2023-11-23 ENCOUNTER — Inpatient Hospital Stay (HOSPITAL_BASED_OUTPATIENT_CLINIC_OR_DEPARTMENT_OTHER): Admitting: Obstetrics & Gynecology

## 2023-11-23 VITALS — BP 136/88 | HR 86 | Temp 98.0°F | Resp 18 | Ht 69.0 in | Wt 165.4 lb

## 2023-11-23 DIAGNOSIS — Z1502 Genetic susceptibility to malignant neoplasm of ovary: Secondary | ICD-10-CM | POA: Diagnosis not present

## 2023-11-23 DIAGNOSIS — Z1501 Genetic susceptibility to malignant neoplasm of breast: Secondary | ICD-10-CM | POA: Diagnosis not present

## 2023-11-23 DIAGNOSIS — R978 Other abnormal tumor markers: Secondary | ICD-10-CM

## 2023-11-23 DIAGNOSIS — Z148 Genetic carrier of other disease: Secondary | ICD-10-CM | POA: Insufficient documentation

## 2023-11-23 DIAGNOSIS — R971 Elevated cancer antigen 125 [CA 125]: Secondary | ICD-10-CM | POA: Insufficient documentation

## 2023-11-23 DIAGNOSIS — Z1509 Genetic susceptibility to other malignant neoplasm: Secondary | ICD-10-CM | POA: Diagnosis not present

## 2023-11-23 NOTE — Patient Instructions (Signed)
 Preparing for your Surgery  We will contact you with potential surgery dates with Dr. Abdul Hodgkin at [facility]. You will be scheduled for a robotic assisted laparoscopic bilateral salpingo-oophorectomy (removal of the ovaries and fallopian tubes).   Pre-operative Testing -You will receive a phone call from presurgical testing at [facility] to arrange for a pre-operative appointment and lab work.  -Bring your insurance card, copy of an advanced directive if applicable, medication list  -At that visit, you will be asked to sign a consent for a possible blood transfusion in case a transfusion becomes necessary during surgery.  The need for a blood transfusion is rare but having consent is a necessary part of your care.     -You should not be taking blood thinners or aspirin at least ten days prior to surgery unless instructed by your surgeon.  -Do not take supplements such as fish oil (omega 3), red yeast rice, turmeric before your surgery. STOP TAKING AT LEAST 10 DAYS BEFORE SURGERY. You want to avoid medications with aspirin in them including headache powders such as BC or Goody's), Excedrin migraine.  Day Before Surgery at Home -You will be asked to take in a light diet the day before surgery. You will be advised you can have clear liquids up until 3 hours before your surgery.    Eat a light diet the day before surgery.  Examples including soups, broths, toast, yogurt, mashed potatoes.  AVOID GAS PRODUCING FOODS AND BEVERAGES. Things to avoid include carbonated beverages (fizzy beverages, sodas), raw fruits and raw vegetables (uncooked), or beans.   If your bowels are filled with gas, your surgeon will have difficulty visualizing your pelvic organs which increases your surgical risks.  Your role in recovery Your role is to become active as soon as directed by your doctor, while still giving yourself time to heal.  Rest when you feel tired. You will be asked to do  the following in order to speed your recovery:  - Cough and breathe deeply. This helps to clear and expand your lungs and can prevent pneumonia after surgery.  - STAY ACTIVE WHEN YOU GET HOME. Do mild physical activity. Walking or moving your legs help your circulation and body functions return to normal. Do not try to get up or walk alone the first time after surgery.   -If you develop swelling on one leg or the other, pain in the back of your leg, redness/warmth in one of your legs, please call the office or go to the Emergency Room to have a doppler to rule out a blood clot. For shortness of breath, chest pain-seek care in the Emergency Room as soon as possible. - Actively manage your pain. Managing your pain lets you move in comfort. We will ask you to rate your pain on a scale of zero to 10. It is your responsibility to tell your doctor or nurse where and how much you hurt so your pain can be treated.  Special Considerations -If you are diabetic, you may be placed on insulin after surgery to have closer control over your blood sugars to promote healing and recovery.  This does not mean that you will be discharged on insulin.  If applicable, your oral antidiabetics will be resumed when you are tolerating a solid diet.  -Your final pathology results from surgery should be available around one week after surgery and the results will be relayed to you when available.  -FMLA forms can be faxed to  [phone] and please allow 5-7 business days for completion.  Pain Management After Surgery -You will be prescribed your pain medication and bowel regimen medications before surgery so that you can have these available when you are discharged from the hospital. The pain medication is for use ONLY AFTER surgery and a new prescription will not be given.   -Make sure that you have Tylenol  and Ibuprofen  IF YOU ARE ABLE TO TAKE THESE MEDICATIONS at home to use on a regular basis after surgery for pain  control. We recommend alternating the medications every hour to six hours since they work differently and are processed in the body differently for pain relief.  -Review the attached handout on narcotic use and their risks and side effects.   Bowel Regimen -You will be prescribed Sennakot-S to take nightly to prevent constipation especially if you are taking the narcotic pain medication intermittently.  It is important to prevent constipation and drink adequate amounts of liquids. You can stop taking this medication when you are not taking pain medication and you are back on your normal bowel routine.  Risks of Surgery Risks of surgery are low but include bleeding, infection, damage to surrounding structures, re-operation, blood clots, and very rarely death.   Blood Transfusion Information (For the consent to be signed before surgery)  We will be checking your blood type before surgery so in case of emergencies, we will know what type of blood you would need.                                            WHAT IS A BLOOD TRANSFUSION?  A transfusion is the replacement of blood or some of its parts. Blood is made up of multiple cells which provide different functions. Red blood cells carry oxygen and are used for blood loss replacement. White blood cells fight against infection. Platelets control bleeding. Plasma helps clot blood. Other blood products are available for specialized needs, such as hemophilia or other clotting disorders. BEFORE THE TRANSFUSION  Who gives blood for transfusions?  You may be able to donate blood to be used at a later date on yourself (autologous donation). Relatives can be asked to donate blood. This is generally not any safer than if you have received blood from a stranger. The same precautions are taken to ensure safety when a relative's blood is donated. Healthy volunteers who are fully evaluated to make sure their blood is safe. This is blood bank  blood. Transfusion therapy is the safest it has ever been in the practice of medicine. Before blood is taken from a donor, a complete history is taken to make sure that person has no history of diseases nor engages in risky social behavior (examples are intravenous drug use or sexual activity with multiple partners). The donor's travel history is screened to minimize risk of transmitting infections, such as malaria. The donated blood is tested for signs of infectious diseases, such as HIV and hepatitis. The blood is then tested to be sure it is compatible with you in order to minimize the chance of a transfusion reaction. If you or a relative donates blood, this is often done in anticipation of surgery and is not appropriate for emergency situations. It takes many days to process the donated blood. RISKS AND COMPLICATIONS Although transfusion therapy is very safe and saves many lives, the main dangers of transfusion  include:  Getting an infectious disease. Developing a transfusion reaction. This is an allergic reaction to something in the blood you were given. Every precaution is taken to prevent this. The decision to have a blood transfusion has been considered carefully by your caregiver before blood is given. Blood is not given unless the benefits outweigh the risks.  AFTER SURGERY INSTRUCTIONS  Return to work: 4-6 weeks if applicable  Activity: 1. Be up and out of the bed during the day.  Take a nap if needed.  You may walk up steps but be careful and use the hand rail.  Stair climbing will tire you more than you think, you may need to stop part way and rest.   2. No lifting or straining for 6 weeks over 10 pounds. No pushing, pulling, straining for 6 weeks.  3. No driving for 1-61 days when the following criteria have been met: Do not drive if you are taking narcotic pain medicine and make sure that your reaction time has returned.   4. You can shower as soon as the next day after surgery.  Shower daily.  Use your regular soap and water  (not directly on the incision) and pat your incision(s) dry afterwards; don't rub.  No tub baths or submerging your body in water  until cleared by your surgeon. If you have the soap that was given to you by pre-surgical testing that was used before surgery, you do not need to use it afterwards because this can irritate your incisions.   5. No sexual activity and nothing in the vagina for 4-6 weeks.  6. You may experience a small amount of clear drainage from your incisions, which is normal.  If the drainage persists, increases, or changes color please call the office.  7. Do not use creams, lotions, or ointments such as neosporin on your incisions after surgery until advised by your surgeon because they can cause removal of the dermabond glue on your incisions.    8. You may experience vaginal spotting after surgery.  The spotting is normal but if you experience heavy bleeding, call our office.  9. Take Tylenol  or ibuprofen  first for pain if you are able to take these medications and only use narcotic pain medication for severe pain not relieved by the Tylenol  or Ibuprofen .  Monitor your Tylenol  intake to a max of 4,000 mg in a 24 hour period. You can alternate these medications after surgery.  Diet: 1. Low sodium Heart Healthy Diet is recommended but you are cleared to resume your normal (before surgery) diet after your procedure.  2. It is safe to use a laxative, such as Miralax or Colace, if you have difficulty moving your bowels before surgery. You have been prescribed Sennakot-S to take at bedtime every evening after surgery to keep bowel movements regular and to prevent constipation.    Wound Care: 1. Keep clean and dry.  Shower daily.  Reasons to call the Doctor: Fever - Oral temperature greater than 100.4 degrees Fahrenheit Foul-smelling vaginal discharge Difficulty urinating Nausea and vomiting Increased pain at the site of the incision  that is unrelieved with pain medicine. Difficulty breathing with or without chest pain New calf pain especially if only on one side Sudden, continuing increased vaginal bleeding with or without clots.   Contacts: For questions or concerns you should contact:  Dr. Abdul Hodgkin at [phone]  Vira Grieves, NP at [phone]  After Hours: call [phone] and have the GYN Oncologist paged/contacted (after 5 pm or  on the weekends). You will speak with an after hours RN and let he or she know you have had surgery.  Messages sent via mychart are for non-urgent matters and are not responded to after hours so for urgent needs, please call the after hours number.

## 2023-11-23 NOTE — Progress Notes (Signed)
 Patient here for follow up with Dr. Gaylin Ke and for a pre-operative appointment. She will be scheduled for a robotic assisted laparoscopic bilateral salpingo-oophorectomy. The surgery was discussed in detail.  See after visit summary for additional details.      Discussed post-op pain management in detail including the aspects of the enhanced recovery pathway.  Advised her that a new prescription would be sent in and it is only to be used for after her upcoming surgery.  We discussed the use of tylenol  post-op and to monitor for a maximum of 4,000 mg in a 24 hour period.  Also prescribed sennakot to be used after surgery and to hold if having loose stools.  Discussed bowel regimen in detail.     Discussed the use of SCDs and measures to take at home to prevent DVT including frequent mobility.  Reportable signs and symptoms of DVT discussed. Post-operative instructions discussed and expectations for after surgery. Incisional care discussed as well including reportable signs and symptoms including erythema, drainage, wound separation.     30 minutes spent with the patient.  Verbalizing understanding of material discussed. No needs or concerns voiced at the end of the visit.   Advised patient to call for any needs.  Advised that her post-operative medications had been prescribed and could be picked up at any time.    This appointment is included in the global surgical bundle as pre-operative teaching and has no charge.

## 2023-11-23 NOTE — Progress Notes (Signed)
 Subjective:       Chief Complaint: Preop visit   History of Present Illness: Gina Ross is a 53 y.o. 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  The planned procedures are rrBSO.  Discussed the use of AI scribe software for clinical note transcription with the patient, who gave verbal consent to proceed.  She was unable to attend her previously scheduled ovarian surgery due to a stomach virus, which caused symptoms of vomiting and diarrhea. She plans to reschedule the surgery in about three weeks after managing her financial obligations.  An MRI was performed in February due to concerns from a previous ultrasound regarding her ovaries. Her sister has undergone a similar surgery.  She is managing her financial responsibilities, including rent, which impacts her ability to schedule surgery.    Review of prior data: Pelvic MRI 1/25: 1. Normal postmenopausal right ovary with tiny follicular remnants measures no greater than 1.2 cm. 2. Left ovary difficult to clearly visualize but without obvious abnormality in the left adnexa. No mass or suspicious contrast enhancement to correspond to reported ultrasound findings.  The following portions of the patient's history were reviewed and updated as appropriate: allergies, current medications, past family history, past medical history, past social history, past surgical history, and problem list.    Review of Systems: Constitutional: negative for fatigue and weight loss Respiratory: negative for cough and wheezing Cardiovascular: negative for chest pain, fatigue and palpitations Gastrointestinal: negative for abdominal pain and change in bowel habits Genitourinary:negative for abnormal discharge Integument/breast: negative for nipple discharge Musculoskeletal:negative for myalgias Neurological: negative for gait problems and tremors Behavioral/Psych: negative for abusive relationship,  depression Endocrine: negative for temperature intolerance  Objective:    BP 136/88 Comment: manual recehck, MD notified  Pulse 86   Temp 98 F (36.7 C) (Tympanic)   Resp 18   Ht 5\' 9"  (1.753 m)   Wt 165 lb 6.4 oz (75 kg)   SpO2 100%   BMI 24.43 kg/m  Constitutional:  General appearance: Well nourished, well developed female in no acute distress.  Psych:  Normal mood and affect, alert and oriented x 3 Neck:  Supple, normal appearance.  Respiratory:  Clear to ascultation.  Cardiovascular:  Normal S1/S2.  Abdomen:  No masses.  No abdominal tenderness.  No hernias.  No hepatosplenomegaly. No guarding or rebound. Pelvic: Deferred.  Prior showed:  General: Normal vulva.     Urethra: No urethral lesion.     Vagina: No lesions. No bleeding             Uterus: irregular contour     Assessment/Plan:    Assessment: The patient is a 53 y.o. 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  The planned procedures are rrBSO.  Verbal consent was obtained for these procedures.  Plan: We reviewed the patient's specific anatomic and functional findings with her, with the assistance of diagrams and handouts.  We reviewed the treatment options including expectant, conservative, medical, and surgical management and she is opting for surgical management.  We reviewed the benefits and risks of each treatment option.   For all procedures, we discussed risks of bleeding, infection, damage to surrounding organs including bowel, bladder, blood vessels, ureters,  numbness and weakness at any body site and the rarer risks of blood clot, heart attack, pneumonia, death.   We also discussed the possible conversion to laparoscopy or laparotomy.  The discussion included the chance of detecting occult malignancy either at  the time of surgery or in the final pathology and potential need for additional surgery. Patients at high risk for ovarian cancer may develop primary peritoneal carcinoma and  remain at risk for this cancer after rrBSO. All her questions were answered and she verbalized understanding.     Pre-operative instructions:  She was instructed to not take Aspirin/NSAIDs x 7days prior to surgery.   Post-operative instructions:  She was given a set of handouts with specific post-operative instructions, including precautions and signs/symptoms for which we would recommend contacting us .   Laboratory testing:   Orders Placed This Encounter  Procedures   CA 125     Preoperative clearance/medical evaluation:  She does not require surgical clearance/medical evaluation.    Post-operative follow-up:  A post-operative appointment will be made for 2 weeks following the procedure  I personally spent 30 minutes face-to-face and non-face-to-face in the care of this patient, which includes all pre, intra, and post visit time on the date of service.

## 2023-11-24 LAB — CA 125: Cancer Antigen (CA) 125: 9.5 U/mL (ref 0.0–38.1)

## 2023-11-30 ENCOUNTER — Telehealth: Payer: Self-pay

## 2023-11-30 NOTE — Telephone Encounter (Signed)
 Gina Ross is aware of CA125 results. She will call back, once she speaks to her office manager, regarding when she can have surgery.

## 2023-11-30 NOTE — Telephone Encounter (Signed)
-----   Message from Suellyn Emory sent at 11/30/2023  9:23 AM EDT ----- Please let her know her CA 125 is 9.5- stable and within normal range.   Please see if she can give you a timeline of when she would want to have surgery. First available or in a mth etc so I can find a date.   Thank you

## 2023-12-05 ENCOUNTER — Telehealth: Payer: Self-pay

## 2023-12-05 ENCOUNTER — Encounter: Payer: Self-pay | Admitting: Hematology and Oncology

## 2023-12-05 ENCOUNTER — Other Ambulatory Visit: Payer: Self-pay

## 2023-12-05 ENCOUNTER — Other Ambulatory Visit: Payer: Self-pay | Admitting: Gynecologic Oncology

## 2023-12-05 ENCOUNTER — Inpatient Hospital Stay: Attending: Obstetrics & Gynecology | Admitting: Licensed Clinical Social Worker

## 2023-12-05 DIAGNOSIS — Z1501 Genetic susceptibility to malignant neoplasm of breast: Secondary | ICD-10-CM

## 2023-12-05 DIAGNOSIS — R1900 Intra-abdominal and pelvic swelling, mass and lump, unspecified site: Secondary | ICD-10-CM

## 2023-12-05 NOTE — Telephone Encounter (Signed)
-----   Message from Suellyn Emory sent at 12/05/2023  3:55 PM EDT ----- When you tell her about the 30th of July, please tell her to hold her tamoxifen  for one week before and one week after if she is still taking this. Thanks

## 2023-12-05 NOTE — Progress Notes (Signed)
 [facility] Clinical Social Work  Clinical Social Work was referred by Engineer, materials for assessment of psychosocial needs.  Clinical Social Worker contacted patient by phone to offer support and assess for needs.    Patient has upcoming surgery scheduled and will need to be out of work for 4 weeks after which will significantly strain her finances and ability to pay rent. Pt asked if there are resources available to assist. CSW shared about emergency assistance (when available) through [employer] and [employer].  Offered to give information on food pantries for help with food expenses, pt declines. She has previously applied for food stamps and been denied for being over income limits. CSW recommended applying for Medicaid to help with medical costs if pt meets income critera (pt unsure of monthly income at this time).  Pt is not eligible for any disease-specific funds based on current diagnosis. Pt voiced understanding and stated she will have to save and then have surgery.     Gina Grey E Maisley Hainsworth, LCSW  Clinical Social Worker [employer]

## 2023-12-05 NOTE — Telephone Encounter (Signed)
 Gina Ross is aware of surgery date being 7/30. Also aware to hold Tamoxifen  one week prior and one week after surgery. (7/23-8/6). She voiced an understanding and is aware she will get a call from [facility] pre-admit testing closer to surgery date.

## 2023-12-05 NOTE — Telephone Encounter (Signed)
 Referral sent to social work, Michelle Zavala, for financial consult as requested by Ms.Puopolo.

## 2023-12-05 NOTE — Telephone Encounter (Signed)
 I spoke to Gina Ross this morning, she states let's shoot for a surgery date of the middle of July.   She also asked if we have a Child psychotherapist to help her with finances. I told her I would reach out to the [facility] social worker team.

## 2023-12-08 ENCOUNTER — Other Ambulatory Visit

## 2024-01-04 ENCOUNTER — Other Ambulatory Visit: Payer: Self-pay | Admitting: Hematology and Oncology

## 2024-01-04 DIAGNOSIS — Z1231 Encounter for screening mammogram for malignant neoplasm of breast: Secondary | ICD-10-CM

## 2024-01-09 NOTE — Patient Instructions (Signed)
 SURGICAL WAITING ROOM VISITATION Patients having surgery or a procedure may have no more than 2 support people in the waiting area - these visitors may rotate.    Children under the age of 46 must have an adult with them who is not the patient.  If the patient needs to stay at the hospital during part of their recovery, the visitor guidelines for inpatient rooms apply. Pre-op nurse will coordinate an appropriate time for 1 support person to accompany patient in pre-op.  This support person may not rotate.    Please refer to the [facility] website for the visitor guidelines for Inpatients (after your surgery is over and you are in a regular room).       Your procedure is scheduled on: 01-25-24   Report to [facility] Main Entrance    Report to admitting at 5:15 AM   Call this number if you have problems the morning of surgery [phone]   Follow a light diet the day before (avoid gas producing foods)   Do not eat food :After Midnight.   After Midnight you may have the following liquids until 4:30 AM DAY OF SURGERY  Water  Non-Citrus Juices (without pulp, NO RED-Apple, White grape, White cranberry) Black Coffee (NO MILK/CREAM OR CREAMERS, sugar ok)  Clear Tea (NO MILK/CREAM OR CREAMERS, sugar ok) regular and decaf                             Plain Jell-O (NO RED)                                           Fruit ices (not with fruit pulp, NO RED)                                     Popsicles (NO RED)                                                               Sports drinks like Gatorade (NO RED)                       If you have questions, please contact your surgeon's office.   FOLLOW BOWEL PREP AND ANY ADDITIONAL PRE OP INSTRUCTIONS YOU RECEIVED FROM YOUR SURGEON'S OFFICE!!!     Oral Hygiene is also important to reduce your risk of infection.                                    Remember - BRUSH YOUR TEETH THE MORNING OF SURGERY WITH YOUR REGULAR TOOTHPASTE   Do  NOT smoke after Midnight   Take these medicines the morning of surgery with A SIP OF WATER :    Claritin    Tamoxifen   Stop all vitamins and herbal supplements 7 days before surgery  Bring CPAP mask and tubing day of surgery.  You may not have any metal on your body including hair pins, jewelry, and body piercing             Do not wear make-up, lotions, powders, perfumes or deodorant  Do not wear nail polish including gel and S&S, artificial/acrylic nails, or any other type of covering on natural nails including finger and toenails. If you have artificial nails, gel coating, etc. that needs to be removed by a nail salon please have this removed prior to surgery or surgery may need to be canceled/ delayed if the surgeon/ anesthesia feels like they are unable to be safely monitored.   Do not shave  48 hours prior to surgery.    Do not bring valuables to the hospital. [facility] IS NOT RESPONSIBLE   FOR VALUABLES.   Contacts, dentures or bridgework may not be worn into surgery.  DO NOT BRING YOUR HOME MEDICATIONS TO THE HOSPITAL. PHARMACY WILL DISPENSE MEDICATIONS LISTED ON YOUR MEDICATION LIST TO YOU DURING YOUR ADMISSION IN THE HOSPITAL!    Patients discharged on the day of surgery will not be allowed to drive home.  Someone NEEDS to stay with you for the first 24 hours after anesthesia.   Special Instructions: Bring a copy of your healthcare power of attorney and living will documents the day of surgery if you haven't scanned them before.              Please read over the following fact sheets you were given: IF YOU HAVE QUESTIONS ABOUT YOUR PRE-OP INSTRUCTIONS PLEASE CALL [phone] G[facility]  If you received a COVID test during your pre-op visit  it is requested that you wear a mask when out in public, stay away from anyone that may not be feeling well and notify your surgeon if you develop symptoms. If you test positive for Covid or have been in contact with  anyone that has tested positive in the last 10 days please notify you surgeon.  [facility] - Preparing for Surgery Before surgery, you can play an important role.  Because skin is not sterile, your skin needs to be as free of germs as possible.  You can reduce the number of germs on your skin by washing with CHG (chlorahexidine gluconate) soap before surgery.  CHG is an antiseptic cleaner which kills germs and bonds with the skin to continue killing germs even after washing. Please DO NOT use if you have an allergy to CHG or antibacterial soaps.  If your skin becomes reddened/irritated stop using the CHG and inform your nurse when you arrive at Short Stay. Do not shave (including legs and underarms) for at least 48 hours prior to the first CHG shower.  You may shave your face/neck.  Please follow these instructions carefully:  1.  Shower with CHG Soap the night before surgery and the  morning of surgery.  2.  If you choose to wash your hair, wash your hair first as usual with your normal  shampoo.  3.  After you shampoo, rinse your hair and body thoroughly to remove the shampoo.                             4.  Use CHG as you would any other liquid soap.  You can apply chg directly to the skin and wash.  Gently with a scrungie or clean washcloth.  5.  Apply the CHG Soap to your body ONLY FROM THE  NECK DOWN.   Do   not use on face/ open                           Wound or open sores. Avoid contact with eyes, ears mouth and   genitals (private parts).                       Wash face,  Genitals (private parts) with your normal soap.             6.  Wash thoroughly, paying special attention to the area where your    surgery  will be performed.  7.  Thoroughly rinse your body with warm water  from the neck down.  8.  DO NOT shower/wash with your normal soap after using and rinsing off the CHG Soap.                9.  Pat yourself dry with a clean towel.            10.  Wear clean pajamas.            11.   Place clean sheets on your bed the night of your first shower and do not  sleep with pets. Day of Surgery : Do not apply any lotions/deodorants the morning of surgery.  Please wear clean clothes to the hospital/surgery center.  FAILURE TO FOLLOW THESE INSTRUCTIONS MAY RESULT IN THE CANCELLATION OF YOUR SURGERY  PATIENT SIGNATURE_________________________________  NURSE SIGNATURE__________________________________  ________________________________________________________________________

## 2024-01-13 ENCOUNTER — Encounter (HOSPITAL_COMMUNITY)
Admission: RE | Admit: 2024-01-13 | Discharge: 2024-01-13 | Disposition: A | Discharge status: Home / Self Care | Source: Ambulatory Visit | Attending: Anesthesiology | Admitting: Anesthesiology

## 2024-01-20 ENCOUNTER — Telehealth: Payer: Self-pay | Admitting: Oncology

## 2024-01-20 NOTE — Patient Instructions (Addendum)
 SURGICAL WAITING ROOM VISITATION Patients having surgery or a procedure may have no more than 2 support people in the waiting area - these visitors may rotate.    Children under the age of 99 must have an adult with them who is not the patient.  If the patient needs to stay at the hospital during part of their recovery, the visitor guidelines for inpatient rooms apply. Pre-op nurse will coordinate an appropriate time for 1 support person to accompany patient in pre-op.  This support person may not rotate.    Please refer to the [facility] website for the visitor guidelines for Inpatients (after your surgery is over and you are in a regular room).       Your procedure is scheduled on: 01-25-24   Report to [facility] Main Entrance    Report to admitting at 5:15 AM   Call this number if you have problems the morning of surgery [phone]   Follow a light diet the day before surgery (avoid gas producing foods)   Do not eat food :After Midnight.   After Midnight you may have the following liquids until 4:30 AM DAY OF SURGERY  Water  Non-Citrus Juices (without pulp, NO RED-Apple, White grape, White cranberry) Black Coffee (NO MILK/CREAM OR CREAMERS, sugar ok)  Clear Tea (NO MILK/CREAM OR CREAMERS, sugar ok) regular and decaf                             Plain Jell-O (NO RED)                                           Fruit ices (not with fruit pulp, NO RED)                                     Popsicles (NO RED)                                                               Sports drinks like Gatorade (NO RED)                      If you have questions, please contact your surgeon's office.   FOLLOW BOWEL PREP AND ANY ADDITIONAL PRE OP INSTRUCTIONS YOU RECEIVED FROM YOUR SURGEON'S OFFICE!!!     Oral Hygiene is also important to reduce your risk of infection.                                    Remember - BRUSH YOUR TEETH THE MORNING OF SURGERY WITH YOUR REGULAR  TOOTHPASTE   Do NOT smoke after Midnight   Take these medicines the morning of surgery with A SIP OF WATER :    Claritin     Stop all vitamins and herbal supplements 7 days before surgery  Hold Tamoxifen  a week before surgery  You may not have any metal on your body including hair pins, jewelry, and body piercing             Do not wear make-up, lotions, powders, perfumes, or deodorant  Do not wear nail polish including gel and S&S, artificial/acrylic nails, or any other type of covering on natural nails including finger and toenails. If you have artificial nails, gel coating, etc. that needs to be removed by a nail salon please have this removed prior to surgery or surgery may need to be canceled/ delayed if the surgeon/ anesthesia feels like they are unable to be safely monitored.   Do not shave  48 hours prior to surgery.        Do not bring valuables to the hospital. [facility] IS NOT RESPONSIBLE   FOR VALUABLES.   Contacts, dentures or bridgework may not be worn into surgery.  DO NOT BRING YOUR HOME MEDICATIONS TO THE HOSPITAL. PHARMACY WILL DISPENSE MEDICATIONS LISTED ON YOUR MEDICATION LIST TO YOU DURING YOUR ADMISSION IN THE HOSPITAL!    Patients discharged on the day of surgery will not be allowed to drive home.  Someone NEEDS to stay with you for the first 24 hours after anesthesia.               Please read over the following fact sheets you were given: IF YOU HAVE QUESTIONS ABOUT YOUR PRE-OP INSTRUCTIONS PLEASE CALL [phone] G[facility]  If you received a COVID test during your pre-op visit  it is requested that you wear a mask when out in public, stay away from anyone that may not be feeling well and notify your surgeon if you develop symptoms. If you test positive for Covid or have been in contact with anyone that has tested positive in the last 10 days please notify you surgeon.  [facility] - Preparing for Surgery Before surgery, you can play  an important role.  Because skin is not sterile, your skin needs to be as free of germs as possible.  You can reduce the number of germs on your skin by washing with CHG (chlorahexidine gluconate) soap before surgery.  CHG is an antiseptic cleaner which kills germs and bonds with the skin to continue killing germs even after washing. Please DO NOT use if you have an allergy to CHG or antibacterial soaps.  If your skin becomes reddened/irritated stop using the CHG and inform your nurse when you arrive at Short Stay. Do not shave (including legs and underarms) for at least 48 hours prior to the first CHG shower.  You may shave your face/neck.  Please follow these instructions carefully:  1.  Shower with CHG Soap the night before surgery and the  morning of surgery.  2.  If you choose to wash your hair, wash your hair first as usual with your normal  shampoo.  3.  After you shampoo, rinse your hair and body thoroughly to remove the shampoo.                             4.  Use CHG as you would any other liquid soap.  You can apply chg directly to the skin and wash.  Gently with a scrungie or clean washcloth.  5.  Apply the CHG Soap to your body ONLY FROM THE NECK DOWN.   Do   not use on face/ open  Wound or open sores. Avoid contact with eyes, ears mouth and   genitals (private parts).                       Wash face,  Genitals (private parts) with your normal soap.             6.  Wash thoroughly, paying special attention to the area where your    surgery  will be performed.  7.  Thoroughly rinse your body with warm water  from the neck down.  8.  DO NOT shower/wash with your normal soap after using and rinsing off the CHG Soap.                9.  Pat yourself dry with a clean towel.            10.  Wear clean pajamas.            11.  Place clean sheets on your bed the night of your first shower and do not  sleep with pets. Day of Surgery : Do not apply any lotions/deodorants  the morning of surgery.  Please wear clean clothes to the hospital/surgery center.  FAILURE TO FOLLOW THESE INSTRUCTIONS MAY RESULT IN THE CANCELLATION OF YOUR SURGERY  PATIENT SIGNATURE_________________________________  NURSE SIGNATURE__________________________________  ________________________________________________________________________   WHAT IS A BLOOD TRANSFUSION? Blood Transfusion Information  A transfusion is the replacement of blood or some of its parts. Blood is made up of multiple cells which provide different functions. Red blood cells carry oxygen and are used for blood loss replacement. White blood cells fight against infection. Platelets control bleeding. Plasma helps clot blood. Other blood products are available for specialized needs, such as hemophilia or other clotting disorders. BEFORE THE TRANSFUSION  Who gives blood for transfusions?  Healthy volunteers who are fully evaluated to make sure their blood is safe. This is blood bank blood. Transfusion therapy is the safest it has ever been in the practice of medicine. Before blood is taken from a donor, a complete history is taken to make sure that person has no history of diseases nor engages in risky social behavior (examples are intravenous drug use or sexual activity with multiple partners). The donor's travel history is screened to minimize risk of transmitting infections, such as malaria. The donated blood is tested for signs of infectious diseases, such as HIV and hepatitis. The blood is then tested to be sure it is compatible with you in order to minimize the chance of a transfusion reaction. If you or a relative donates blood, this is often done in anticipation of surgery and is not appropriate for emergency situations. It takes many days to process the donated blood. RISKS AND COMPLICATIONS Although transfusion therapy is very safe and saves many lives, the main dangers of transfusion include:  Getting an  infectious disease. Developing a transfusion reaction. This is an allergic reaction to something in the blood you were given. Every precaution is taken to prevent this. The decision to have a blood transfusion has been considered carefully by your caregiver before blood is given. Blood is not given unless the benefits outweigh the risks. AFTER THE TRANSFUSION Right after receiving a blood transfusion, you will usually feel much better and more energetic. This is especially true if your red blood cells have gotten low (anemic). The transfusion raises the level of the red blood cells which carry oxygen, and this usually causes an energy increase. The nurse administering the  transfusion will monitor you carefully for complications. HOME CARE INSTRUCTIONS  No special instructions are needed after a transfusion. You may find your energy is better. Speak with your caregiver about any limitations on activity for underlying diseases you may have. SEEK MEDICAL CARE IF:  Your condition is not improving after your transfusion. You develop redness or irritation at the intravenous (IV) site. SEEK IMMEDIATE MEDICAL CARE IF:  Any of the following symptoms occur over the next 12 hours: Shaking chills. You have a temperature by mouth above 102 F (38.9 C), not controlled by medicine. Chest, back, or muscle pain. People around you feel you are not acting correctly or are confused. Shortness of breath or difficulty breathing. Dizziness and fainting. You get a rash or develop hives. You have a decrease in urine output. Your urine turns a dark color or changes to pink, red, or brown. Any of the following symptoms occur over the next 10 days: You have a temperature by mouth above 102 F (38.9 C), not controlled by medicine. Shortness of breath. Weakness after normal activity. The white part of the eye turns yellow (jaundice). You have a decrease in the amount of urine or are urinating less often. Your urine  turns a dark color or changes to pink, red, or brown. Document Released: 06/11/2000 Document Revised: 09/06/2011 Document Reviewed: 01/29/2008 [facility] Patient Information 2014 [place], [place].  _______________________________________________________________________

## 2024-01-20 NOTE — Telephone Encounter (Signed)
 Called Gina Ross and confirmed that she does want surgery next week on 01/25/2024 with Dr. Rogelio.

## 2024-01-23 ENCOUNTER — Encounter (HOSPITAL_COMMUNITY)
Admission: RE | Admit: 2024-01-23 | Discharge: 2024-01-23 | Disposition: A | Discharge status: Home / Self Care | Source: Ambulatory Visit | Attending: Obstetrics & Gynecology | Admitting: Obstetrics & Gynecology

## 2024-01-23 ENCOUNTER — Encounter (HOSPITAL_COMMUNITY): Payer: Self-pay | Admitting: Anesthesiology

## 2024-01-23 ENCOUNTER — Encounter (HOSPITAL_COMMUNITY): Payer: Self-pay

## 2024-01-23 ENCOUNTER — Other Ambulatory Visit: Payer: Self-pay

## 2024-01-23 HISTORY — DX: Gastro-esophageal reflux disease without esophagitis: K21.9

## 2024-01-23 NOTE — Progress Notes (Signed)
 Phoned and spoke to patient, preop appointment completed over the phone and instructions reviewed with patient and sent to patient's portal.  Eleanor Epps, NP made aware that patient did not hold Tamoxifen  a week prior to surgery as recommended and that she would not be able to come get labs done prior to surgery.

## 2024-01-23 NOTE — Progress Notes (Signed)
 Patient a no-show for preop appointment.  Left message to return call.

## 2024-01-23 NOTE — Progress Notes (Signed)
 COVID Vaccine Completed:  Date of COVID positive in last 90 days: No  PCP - No PCP Cardiologist - N/A  Chest x-ray - N/A EKG - N/A Stress Test - N/A ECHO - 08-29-19 Epic Cardiac Cath - N/A Pacemaker/ICD device last checked:N/A Spinal Cord Stimulator:N/A  Bowel Prep - N/A  Sleep Study - N/A CPAP -   Fasting Blood Sugar - N/A Checks Blood Sugar _____ times a day  Last dose of GLP1 agonist-  N/A GLP1 instructions:  Do not take after     Last dose of SGLT-2 inhibitors-  N/A SGLT-2 instructions:  Do not take after    Blood Thinner Instructions:  N/A Aspirin Instructions: Last Dose:  Activity level:  Can go up a flight of stairs and perform activities of daily living without stopping and without symptoms of chest pain or shortness of breath.  Anesthesia review: N/A  Patient denies shortness of breath, fever, cough and chest pain at PAT appointment  Patient verbalized understanding of instructions that were given to them at the PAT appointment. Patient was also instructed that they will need to review over the PAT instructions again at home before surgery.

## 2024-01-24 ENCOUNTER — Telehealth: Payer: Self-pay | Admitting: *Deleted

## 2024-01-24 ENCOUNTER — Encounter (HOSPITAL_COMMUNITY): Payer: Self-pay | Admitting: Obstetrics & Gynecology

## 2024-01-24 NOTE — Telephone Encounter (Signed)
 Telephone call to check on pre-operative status.  Patient compliant with pre-operative instructions.  Reinforced nothing to eat after midnight. Clear liquids until 0415. Patient to arrive at 0515.  No questions or concerns voiced.  Instructed to call for any needs.

## 2024-01-24 NOTE — H&P (Signed)
 Subjective:          Chief Complaint: Preop visit     History of Present Illness: Gina Ross is a 53 y.o. 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  The planned procedures are rrBSO.  Discussed the use of AI scribe software for clinical note transcription with the patient, who gave verbal consent to proceed.   She was unable to attend her previously scheduled ovarian surgery due to a stomach virus, which caused symptoms of vomiting and diarrhea. She plans to reschedule the surgery in about three weeks after managing her financial obligations.   An MRI was performed in February due to concerns from a previous ultrasound regarding her ovaries. Her sister has undergone a similar surgery.   She is managing her financial responsibilities, including rent, which impacts her ability to schedule surgery.      Review of prior data: Pelvic MRI 1/25: 1. Normal postmenopausal right ovary with tiny follicular remnants measures no greater than 1.2 cm. 2. Left ovary difficult to clearly visualize but without obvious abnormality in the left adnexa. No mass or suspicious contrast enhancement to correspond to reported ultrasound findings. 5/25: CA 125: 9.5   The following portions of the patient's history were reviewed and updated as appropriate: allergies, current medications, past family history, past medical history, past social history, past surgical history, and problem list.       Review of Systems: Constitutional: negative for fatigue and weight loss Respiratory: negative for cough and wheezing Cardiovascular: negative for chest pain, fatigue and palpitations Gastrointestinal: negative for abdominal pain and change in bowel habits Genitourinary:negative for abnormal discharge Integument/breast: negative for nipple discharge Musculoskeletal:negative for myalgias Neurological: negative for gait problems and tremors Behavioral/Psych: negative for abusive  relationship, depression Endocrine: negative for temperature intolerance   Objective:    BP 136/88 Comment: manual recehck, MD notified  Pulse 86   Temp 98 F (36.7 C) (Tympanic)   Resp 18   Ht 5' 9 (1.753 m)   Wt 165 lb 6.4 oz (75 kg)   SpO2 100%   BMI 24.43 kg/m  Constitutional:  General appearance: Well nourished, well developed female in no acute distress.  Psych:  Normal mood and affect, alert and oriented x 3 Neck:  Supple, normal appearance.  Respiratory:  Clear to ascultation.  Cardiovascular:  Normal S1/S2.  Abdomen:  No masses.  No abdominal tenderness.  No hernias.  No hepatosplenomegaly. No guarding or rebound. Pelvic: Deferred.  Prior showed:  General: Normal vulva.     Urethra: No urethral lesion.     Vagina: No lesions. No bleeding             Uterus: irregular contour        Assessment/Plan:    Assessment: The patient is a 53 y.o. 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  The planned procedures are rrBSO.  Verbal consent was obtained for these procedures.   Plan: We reviewed the patient's specific anatomic and functional findings with her, with the assistance of diagrams and handouts.  We reviewed the treatment options including expectant, conservative, medical, and surgical management and she is opting for surgical management.  We reviewed the benefits and risks of each treatment option.    For all procedures, we discussed risks of bleeding, infection, damage to surrounding organs including bowel, bladder, blood vessels, ureters,  numbness and weakness at any body site and the rarer risks of blood clot, heart attack, pneumonia, death.  We also discussed the possible conversion to laparoscopy or laparotomy.  The discussion included the chance of detecting occult malignancy either at the time of surgery or in the final pathology and potential need for additional surgery. Patients at high risk for ovarian cancer may develop primary  peritoneal carcinoma and remain at risk for this cancer after rrBSO. All her questions were answered and she verbalized understanding.      Pre-operative instructions:  She was instructed to not take Aspirin/NSAIDs x 7days prior to surgery.    Post-operative instructions:  She was given a set of handouts with specific post-operative instructions, including precautions and signs/symptoms for which we would recommend contacting us .      Preoperative clearance/medical evaluation:  She does not require surgical clearance/medical evaluation.     Post-operative follow-up:  A post-operative appointment will be made for 2 weeks following the procedure   I personally spent 30 minutes face-to-face and non-face-to-face in the care of this patient, which includes all pre, intra, and post visit time on the date of service.

## 2024-01-25 ENCOUNTER — Encounter (HOSPITAL_COMMUNITY): Payer: Self-pay | Admitting: Medical

## 2024-01-25 ENCOUNTER — Encounter (HOSPITAL_COMMUNITY): Admission: RE | Payer: Self-pay | Source: Home / Self Care

## 2024-01-25 ENCOUNTER — Ambulatory Visit (HOSPITAL_COMMUNITY): Admission: RE | Admit: 2024-01-25 | Source: Home / Self Care | Admitting: Obstetrics & Gynecology

## 2024-01-25 DIAGNOSIS — Z1501 Genetic susceptibility to malignant neoplasm of breast: Secondary | ICD-10-CM

## 2024-01-25 DIAGNOSIS — C50911 Malignant neoplasm of unspecified site of right female breast: Secondary | ICD-10-CM

## 2024-01-25 SURGERY — SALPINGO-OOPHORECTOMY, ROBOT-ASSISTED
Anesthesia: General | Laterality: Bilateral

## 2024-01-25 NOTE — Progress Notes (Signed)
 Ate at work yesterday not sure if allergy or throwing up and diarrhea.  No bowel prep for procedure.   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   Thanking about going to the urgent care she feels so bad  Running a fever - 99.7 last temp she hasn't taken any tylenol .   Instructed patient to contact her office when they open to be rescheduled.  6:07 AM OR desk notified and Dr Leonce Ada aware.

## 2024-02-02 ENCOUNTER — Ambulatory Visit

## 2024-02-10 ENCOUNTER — Ambulatory Visit

## 2024-02-29 ENCOUNTER — Ambulatory Visit

## 2024-03-14 ENCOUNTER — Ambulatory Visit

## 2024-04-09 ENCOUNTER — Telehealth: Payer: Self-pay

## 2024-04-09 NOTE — Telephone Encounter (Signed)
 S/w patient regarding questions about flu shot and upcoming appointments. Patient aware that she can receive the flu vaccine through her work. Patient states that she will plan on doing that since they have a flu clinic coming up.  Patient also aware of upcoming mammogram and MD visit with Dr. Odean. Confirmed upcoming appointment dates and times. All questions answered at time of call.

## 2024-04-17 ENCOUNTER — Ambulatory Visit

## 2024-04-18 ENCOUNTER — Ambulatory Visit
Admission: RE | Admit: 2024-04-18 | Discharge: 2024-04-18 | Disposition: A | Discharge status: Home / Self Care | Source: Ambulatory Visit | Attending: Hematology and Oncology | Admitting: Hematology and Oncology

## 2024-04-18 DIAGNOSIS — Z1231 Encounter for screening mammogram for malignant neoplasm of breast: Secondary | ICD-10-CM

## 2024-04-19 ENCOUNTER — Other Ambulatory Visit: Payer: Self-pay | Admitting: *Deleted

## 2024-04-19 MED ORDER — TAMOXIFEN CITRATE 20 MG PO TABS: 20.0000 mg | ORAL_TABLET | Freq: Every day | ORAL | 3 refills | Status: DC

## 2024-04-22 ENCOUNTER — Ambulatory Visit (INDEPENDENT_AMBULATORY_CARE_PROVIDER_SITE_OTHER)

## 2024-04-22 ENCOUNTER — Ambulatory Visit: Payer: Self-pay | Admitting: Urgent Care

## 2024-04-22 ENCOUNTER — Ambulatory Visit
Admission: EM | Admit: 2024-04-22 | Discharge: 2024-04-22 | Disposition: A | Discharge status: Home / Self Care | Attending: Family Medicine | Admitting: Family Medicine

## 2024-04-22 DIAGNOSIS — M25562 Pain in left knee: Secondary | ICD-10-CM

## 2024-04-22 DIAGNOSIS — S8002XA Contusion of left knee, initial encounter: Secondary | ICD-10-CM

## 2024-04-22 MED ORDER — DICLOFENAC SODIUM 3 % EX GEL: 1.0000 | Freq: Two times a day (BID) | CUTANEOUS | 0 refills | Status: AC | PRN

## 2024-04-22 NOTE — ED Provider Notes (Signed)
 [facility]  Note:  This document was prepared using Conservation officer, historic buildings and may include unintentional dictation errors.  MRN: [id number] DOB: Nov 21, 1970  Subjective:   Gina Ross is a 53 y.o. female presenting for 2.5 month history of left knee pain from suffering a fall while at work. Patient slipped and fell landing directly onto her left knee. Patient reports that she is coming in now because she works for a [employer] but not the facility where she was working. The facility never reported it to her agency until this past week and therefore advised her to come in now. She has continued to work through the injury.  Regarding her blood pressure, patient does not take blood pressure medication.  States that it typically is elevated at the doctor's office.  She plans on rechecking this at home and with her PCP.  No current facility-administered medications for this encounter.  Current Outpatient Medications:    ibuprofen  (ADVIL ) 200 MG tablet, Take 400 mg by mouth every 8 (eight) hours as needed for mild pain (pain score 1-3)., Disp: , Rfl:    loratadine  (CLARITIN ) 10 MG tablet, Take 10 mg by mouth daily as needed for allergies., Disp: , Rfl:    naphazoline-pheniramine (ALLERGY EYE) 0.025-0.3 % ophthalmic solution, 1-2 drops 3 (three) times daily as needed for eye irritation or allergies., Disp: , Rfl:    tamoxifen  (NOLVADEX ) 20 MG tablet, Take 1 tablet (20 mg total) by mouth daily., Disp: 90 tablet, Rfl: 3   triamcinolone  cream (KENALOG ) 0.1 %, Apply 1 Application topically 2 (two) times daily. (Patient not taking: Reported on 01/11/2024), Disp: 45 g, Rfl: 0   Allergies  Allergen Reactions   Penicillins Swelling and Rash    Past Medical History:  Diagnosis Date   Asthma    as a child   BRCA1 gene mutation positive in female    Breast cancer (HCC)    Family history of breast cancer    Family history of colon cancer    Family history of  prostate cancer    GERD (gastroesophageal reflux disease)    History of radiation therapy 03/25/2020-05/05/2020   IMRT to right breast     Dr Lynwood Nasuti   Personal history of chemotherapy    Personal history of radiation therapy      Past Surgical History:  Procedure Laterality Date   COLONOSCOPY     HARDWARE REMOVAL Right 09/11/2014   Procedure: Removal Deep Hardware Right Knee;  Surgeon: Gina Harden GAILS, MD;  Location: [facility] OR;  Service: Orthopedics;  Laterality: Right;   IR IMAGING GUIDED PORT INSERTION  09/04/2019   MASTECTOMY     MASTECTOMY WITH AXILLARY LYMPH NODE DISSECTION Right 01/24/2020   Procedure: RIGHT MASTECTOMY WITH AXILLARY LYMPH NODE DISSECTION;  Surgeon: Ebbie Cough, MD;  Location: [facility] OR;  Service: General;  Laterality: Right;  PEC BLOCK   ORIF PATELLA Right 03/22/2014   Procedure: Open Reduction Internal Fixation Right Patella ;  Surgeon: Gina Harden GAILS, MD;  Location[facility];  Service: Orthopedics;  Laterality: Right;   PORT-A-CATH REMOVAL      Family History  Problem Relation Age of Onset   Hypertension Mother    Diabetes Mother    Breast cancer Mother        dx. 40s   Hypertension Father    Colon cancer Paternal Aunt        dx. 30s   Breast cancer Maternal Grandmother  bilateral   Cervical cancer Maternal Grandmother        tumor on cervix   Prostate cancer Maternal Uncle        dx. 50s   Breast cancer Cousin        dx. late 30s   Breast cancer Cousin        dx. <50   Pancreatic cancer Neg Hx    Endometrial cancer Neg Hx     Social History   Tobacco Use   Smoking status: Former    Current packs/day: 0.10    Types: Cigarettes   Smokeless tobacco: Never   Tobacco comments:    marijuana  Vaping Use   Vaping status: Never Used  Substance Use Topics   Alcohol use: Yes    Comment: occ   Drug use: Not Currently    Types: Marijuana    ROS   Objective:   Vitals: BP (!) 153/113 (BP Location: Left Arm)   Pulse 93   Temp 98.8 F  (37.1 C) (Oral)   Resp 20   SpO2 97%   BP Readings from Last 3 Encounters:  04/22/24 (!) 153/113  11/23/23 136/88  11/07/23 (!) 128/92    Physical Exam Constitutional:      General: She is not in acute distress.    Appearance: Normal appearance. She is well-developed. She is not ill-appearing, toxic-appearing or diaphoretic.  HENT:     Head: Normocephalic and atraumatic.     Nose: Nose normal.     Mouth/Throat:     Mouth: Mucous membranes are moist.  Eyes:     General: No scleral icterus.       Right eye: No discharge.        Left eye: No discharge.     Extraocular Movements: Extraocular movements intact.  Cardiovascular:     Rate and Rhythm: Normal rate.  Pulmonary:     Effort: Pulmonary effort is normal.  Musculoskeletal:     Left knee: Bony tenderness present. No swelling, deformity, effusion, erythema, ecchymosis, lacerations or crepitus. Normal range of motion. Tenderness present over the medial joint line and patellar tendon. No lateral joint line tenderness. Normal alignment and normal patellar mobility.  Skin:    General: Skin is warm and dry.  Neurological:     General: No focal deficit present.     Mental Status: She is alert and oriented to person, place, and time.  Psychiatric:        Mood and Affect: Mood normal.        Behavior: Behavior normal.    4 Ace wrap applied to the left knee.   Assessment and Plan :   PDMP not reviewed this encounter.  1. Acute pain of left knee    I was not able to really interpret the images due to some of the grid lines.  Patient did not want to wait for radiology overread.  She requested that we call her with the result.  Recommended diclofenac  gel, RICE method.  Follow-up with an orthopedist to rule out ligament or meniscus injury.  Regarding her blood pressure, patient plans on following up with her PCP as soon as possible.  Counseled patient on potential for adverse effects with medications prescribed/recommended today,  ER and return-to-clinic precautions discussed, patient verbalized understanding.    Christopher Savannah, [place] 04/22/24 [id number]

## 2024-04-22 NOTE — ED Triage Notes (Addendum)
 Pt c/o left knee pain after falling on knee at work in [place] she did not seek medical care after fall-was recently advised by employer of need to be seen-NAD-steady gait

## 2024-04-22 NOTE — Discharge Instructions (Addendum)
 Use diclofenac  gel directly onto the knee for pain and inflammation. Use the Ace wrap during the day for structural support and compression relief. Do not sleep with Ace wrap on. Follow up with an orthopedist to see if they would like to pursue an MRI or ultrasound to rule out a ligament or meniscus injury. You can also follow up with them or an occupational health clinic for work restrictions.

## 2024-04-23 ENCOUNTER — Telehealth: Payer: Self-pay | Admitting: *Deleted

## 2024-04-23 NOTE — Telephone Encounter (Signed)
 Spoke with patient who called to schedule a follow up appointment with Dr. Viktoria. Pt was given an appt. With Dr. Rogelio for Wednesday, November 12 th. At 2pm. Pt agreed to date and time and had no further concerns at this time.

## 2024-04-24 ENCOUNTER — Encounter: Payer: Self-pay | Admitting: Hematology and Oncology

## 2024-05-09 ENCOUNTER — Inpatient Hospital Stay: Attending: Obstetrics & Gynecology | Admitting: Obstetrics & Gynecology

## 2024-05-09 DIAGNOSIS — Z1501 Genetic susceptibility to malignant neoplasm of breast: Secondary | ICD-10-CM

## 2024-07-03 ENCOUNTER — Other Ambulatory Visit: Payer: Self-pay

## 2024-07-03 MED ORDER — TAMOXIFEN CITRATE 20 MG PO TABS: 20.0000 mg | ORAL_TABLET | Freq: Every day | ORAL | 3 refills | Status: AC

## 2024-07-16 ENCOUNTER — Telehealth: Payer: Self-pay | Admitting: *Deleted

## 2024-07-16 NOTE — Telephone Encounter (Signed)
 Spoke with the patient and moved appt from 1/28 to 2/11

## 2024-07-25 ENCOUNTER — Inpatient Hospital Stay: Admitting: Obstetrics & Gynecology

## 2024-08-08 ENCOUNTER — Inpatient Hospital Stay: Attending: Obstetrics & Gynecology | Admitting: Obstetrics & Gynecology

## 2024-08-08 DIAGNOSIS — Z1589 Genetic susceptibility to other disease: Secondary | ICD-10-CM

## 2024-08-08 DIAGNOSIS — R1900 Intra-abdominal and pelvic swelling, mass and lump, unspecified site: Secondary | ICD-10-CM

## 2024-09-06 ENCOUNTER — Inpatient Hospital Stay: Attending: Obstetrics & Gynecology | Admitting: Hematology and Oncology
